# Patient Record
Sex: Male | Born: 1995 | Race: Black or African American | Hispanic: No | Marital: Single | Smoking: Never smoker
Health system: Southern US, Community
[De-identification: ages and names within clinical notes are randomized; demographics above are authoritative.]

## PROBLEM LIST (undated history)

## (undated) DIAGNOSIS — J302 Other seasonal allergic rhinitis: Secondary | ICD-10-CM

## (undated) DIAGNOSIS — F319 Bipolar disorder, unspecified: Secondary | ICD-10-CM

## (undated) HISTORY — PX: NO PAST SURGERIES: SHX2092

---
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
## 2016-08-14 ENCOUNTER — Emergency Department: Payer: 59

## 2016-08-14 ENCOUNTER — Encounter: Payer: Self-pay | Admitting: Emergency Medicine

## 2016-08-14 ENCOUNTER — Emergency Department (EMERGENCY_DEPARTMENT_HOSPITAL)
Admission: EM | Admit: 2016-08-14 | Discharge: 2016-08-15 | Disposition: A | Discharge status: Other Acute Inpatient Hospital | Payer: 59 | Source: Home / Self Care | Attending: Student | Admitting: Student

## 2016-08-14 DIAGNOSIS — R4689 Other symptoms and signs involving appearance and behavior: Secondary | ICD-10-CM

## 2016-08-14 DIAGNOSIS — F17200 Nicotine dependence, unspecified, uncomplicated: Secondary | ICD-10-CM

## 2016-08-14 DIAGNOSIS — F23 Brief psychotic disorder: Secondary | ICD-10-CM | POA: Insufficient documentation

## 2016-08-14 DIAGNOSIS — F2081 Schizophreniform disorder: Secondary | ICD-10-CM

## 2016-08-14 DIAGNOSIS — F312 Bipolar disorder, current episode manic severe with psychotic features: Secondary | ICD-10-CM | POA: Diagnosis not present

## 2016-08-14 LAB — COMPREHENSIVE METABOLIC PANEL
ALBUMIN: 5.2 g/dL — AB (ref 3.5–5.0)
ALT: 12 U/L — ABNORMAL LOW (ref 17–63)
ANION GAP: 8 (ref 5–15)
AST: 19 U/L (ref 15–41)
Alkaline Phosphatase: 99 U/L (ref 38–126)
BUN: 16 mg/dL (ref 6–20)
CO2: 24 mmol/L (ref 22–32)
Calcium: 9.6 mg/dL (ref 8.9–10.3)
Chloride: 107 mmol/L (ref 101–111)
Creatinine, Ser: 1.14 mg/dL (ref 0.61–1.24)
Glucose, Bld: 130 mg/dL — ABNORMAL HIGH (ref 65–99)
POTASSIUM: 3.4 mmol/L — AB (ref 3.5–5.1)
Sodium: 139 mmol/L (ref 135–145)
TOTAL PROTEIN: 8.2 g/dL — AB (ref 6.5–8.1)
Total Bilirubin: 1.1 mg/dL (ref 0.3–1.2)

## 2016-08-14 LAB — SALICYLATE LEVEL

## 2016-08-14 LAB — CBC
HCT: 44.6 % (ref 40.0–52.0)
HEMOGLOBIN: 15.7 g/dL (ref 13.0–18.0)
MCH: 30.8 pg (ref 26.0–34.0)
MCHC: 35.1 g/dL (ref 32.0–36.0)
MCV: 87.7 fL (ref 80.0–100.0)
PLATELETS: 210 10*3/uL (ref 150–440)
RBC: 5.09 MIL/uL (ref 4.40–5.90)
RDW: 13 % (ref 11.5–14.5)
WBC: 5.2 10*3/uL (ref 3.8–10.6)

## 2016-08-14 LAB — ACETAMINOPHEN LEVEL

## 2016-08-14 LAB — ETHANOL

## 2016-08-14 MED ORDER — LORAZEPAM 2 MG/ML IJ SOLN: 2.0000 mg | INTRAMUSCULAR | Status: DC | PRN

## 2016-08-14 MED ORDER — RISPERIDONE 1 MG PO TBDP
1.0000 mg | ORAL_TABLET | Freq: Two times a day (BID) | ORAL | Status: DC
  Administered 2016-08-15 (×2): 1 mg via ORAL
  Filled 2016-08-14 (×3): qty 1

## 2016-08-14 MED ORDER — LORAZEPAM 2 MG/ML IJ SOLN
1.0000 mg | Freq: Once | INTRAMUSCULAR | Status: AC
  Administered 2016-08-14: 1 mg via INTRAMUSCULAR
  Filled 2016-08-14: qty 1

## 2016-08-14 MED ORDER — DIPHENHYDRAMINE HCL 50 MG/ML IJ SOLN
50.0000 mg | Freq: Once | INTRAMUSCULAR | Status: AC
  Administered 2016-08-14: 50 mg via INTRAMUSCULAR
  Filled 2016-08-14: qty 1

## 2016-08-14 MED ORDER — LORAZEPAM 2 MG PO TABS: 2.0000 mg | ORAL_TABLET | ORAL | Status: DC | PRN

## 2016-08-14 MED ORDER — ZIPRASIDONE MESYLATE 20 MG IM SOLR
20.0000 mg | Freq: Once | INTRAMUSCULAR | Status: AC
  Administered 2016-08-14: 20 mg via INTRAMUSCULAR
  Filled 2016-08-14: qty 20

## 2016-08-14 NOTE — BH Assessment (Signed)
TTS was unable to complete consult due to patient giving IM medications and was resting.

## 2016-08-14 NOTE — ED Notes (Signed)
PT  IVC  PENDING  PLACEMENT SEEN  BY  DR  [employer]

## 2016-08-14 NOTE — ED Triage Notes (Addendum)
Pt to ed with police who report pt was picked up after trying to bang on neighbors door.  Pt alert and oriented at triage.  Pt refusing to answer questions at times.  Staring off into space and not following commands.  Pt in handcuffs with police present.  Pt family reports he has been paranoid lately, pt reports he is different than others.  Per pt mother pt has been up for the past 2 days without sleep and yelling loudly when the lights are out,however states pt has been "saying things that don't make sense for several weeks" pt fearful at times according to family.  Per family pt has NO history of diagnosed mental illness.

## 2016-08-14 NOTE — Consult Note (Signed)
[facility] Psychiatry Consult   Reason for Consult:  Consult 20 year old man with what appears to be new onset psychosis brought into the emergency room under ivc filed by family Referring Physician:  lord Patient Identification: Leonard Davidson MRN:  [id number] Principal Diagnosis: Schizophreniform disorder [facility]) Diagnosis:   Patient Active Problem List   Diagnosis Date Noted  . Schizophreniform disorder ([place]) [F20.81] 08/14/2016    Total Time spent with patient: 1 hour  Subjective:   Leonard Davidson is a 20 y.o. male patient admitted with "I'm good, bro".  HPI:  Patient interviewed. Chart review. 20 year old man brought to the hospital under IVC filed by his family. I spoke with the patient although he was not forthcoming with much useful information. Labs have been reviewed so far. Spoke with his mother on the telephone. Family reports that he has been showing progressively strange or behavior 4 months. They described that recently he hardly ever leaves the house, doesn't communicate with other people, talks about how he hates everyone. He rarely sleeps and stays up all night. Not eating normally. He has been asking bizarre questions of his mother. Refusing to sleep at night without having every light on in the house. On interview today the patient wouldn't answer direct questions about any of his symptoms. He answers most questions with other questions of his own or with odd comments. He makes gestures to suggest that he's been smoking recently but won't answer specific questions about it. Won't answer specific questions about hallucinations. Make some odd statements at times. Reportedly he had been making other bizarre and paranoid sounding statements when he first came into the emergency room. Mother reports that the patient never really has developed normally since graduating high school. He only works about a day a week and mostly stays home watching video games. She does not  know whether or what his substance abuse pattern might be. Evidently family has tried to get him to see a mental health professional but he has refused up until now.  Social history: Lives with his mother and brother. Mother reports that the patient works about one day a week. Father not involved in his life.  Medical history: No known ongoing medical problems.  Substance abuse history: Drug screen is not back yet. Alcohol level is negative. Patient makes a lot of gestures indicating the act of smoking but won't answer any specific questions about it. Past Psychiatric History: No previous psychiatric treatment or evaluation. No previous hospitalization. No history of suicide attempts or violence.  Risk to Self: Is patient at risk for suicide?: No, but patient needs Medical Clearance Risk to Others:   Prior Inpatient Therapy:   Prior Outpatient Therapy:    Past Medical History: History reviewed. No pertinent past medical history. History reviewed. No pertinent surgical history. Family History: History reviewed. No pertinent family history. Family Psychiatric  History: Mother is not aware of any family history of mental illness Social History:  History  Alcohol Use No     History  Drug Use  . Types: Marijuana    Social History   Social History  . Marital status: Single    Spouse name: N/A  . Number of children: N/A  . Years of education: N/A   Social History Main Topics  . Smoking status: Current Every Day Smoker  . Smokeless tobacco: Never Used  . Alcohol use No  . Drug use:     Types: Marijuana  . Sexual activity: Not Asked   Other  Topics Concern  . None   Social History Narrative  . None   Additional Social History:    Allergies:  No Known Allergies  Labs:  Results for orders placed or performed during the hospital encounter of 08/14/16 (from the past 48 hour(s))  Comprehensive metabolic panel     Status: Abnormal   Collection Time: 08/14/16  1:58 PM  Result  Value Ref Range   Sodium 139 135 - 145 mmol/L   Potassium 3.4 (L) 3.5 - 5.1 mmol/L   Chloride 107 101 - 111 mmol/L   CO2 24 22 - 32 mmol/L   Glucose, Bld 130 (H) 65 - 99 mg/dL   BUN 16 6 - 20 mg/dL   Creatinine, Ser 1.14 0.61 - 1.24 mg/dL   Calcium 9.6 8.9 - 10.3 mg/dL   Total Protein 8.2 (H) 6.5 - 8.1 g/dL   Albumin 5.2 (H) 3.5 - 5.0 g/dL   AST 19 15 - 41 U/L   ALT 12 (L) 17 - 63 U/L   Alkaline Phosphatase 99 38 - 126 U/L   Total Bilirubin 1.1 0.3 - 1.2 mg/dL   GFR calc non Af Amer >60 >60 mL/min   GFR calc Af Amer >60 >60 mL/min    Comment: (NOTE) The eGFR has been calculated using the CKD EPI equation. This calculation has not been validated in all clinical situations. eGFR's persistently <60 mL/min signify possible Chronic Kidney Disease.    Anion gap 8 5 - 15  Ethanol     Status: None   Collection Time: 08/14/16  1:58 PM  Result Value Ref Range   Alcohol, Ethyl (B) <5 <5 mg/dL    Comment:        LOWEST DETECTABLE LIMIT FOR SERUM ALCOHOL IS 5 mg/dL FOR MEDICAL PURPOSES ONLY   cbc     Status: None   Collection Time: 08/14/16  1:58 PM  Result Value Ref Range   WBC 5.2 3.8 - 10.6 K/uL   RBC 5.09 4.40 - 5.90 MIL/uL   Hemoglobin 15.7 13.0 - 18.0 g/dL   HCT 44.6 40.0 - 52.0 %   MCV 87.7 80.0 - 100.0 fL   MCH 30.8 26.0 - 34.0 pg   MCHC 35.1 32.0 - 36.0 g/dL   RDW 13.0 11.5 - 14.5 %   Platelets 210 150 - 440 K/uL    Current Facility-Administered Medications  Medication Dose Route Frequency Provider Last Rate Last Dose  . LORazepam (ATIVAN) injection 1 mg  1 mg Intramuscular Once Joanne Gavel, MD      . LORazepam (ATIVAN) tablet 2 mg  2 mg Oral Q4H PRN Gonzella Lex, MD       Or  . LORazepam (ATIVAN) injection 2 mg  2 mg Intramuscular Q4H PRN Gonzella Lex, MD      . risperiDONE (RISPERDAL M-TABS) disintegrating tablet 1 mg  1 mg Oral BID Gonzella Lex, MD      . ziprasidone (GEODON) injection 20 mg  20 mg Intramuscular Once Joanne Gavel, MD       No current  outpatient prescriptions on file.    Musculoskeletal: Strength & Muscle Tone: within normal limits Gait & Station: normal Patient leans: N/A  Psychiatric Specialty Exam: Physical Exam  Nursing note and vitals reviewed. Constitutional: He appears well-developed and well-nourished.  HENT:  Head: Normocephalic and atraumatic.  Eyes: Conjunctivae are normal. Pupils are equal, round, and reactive to light.  Neck: Normal range of motion.  Cardiovascular: Regular rhythm and  normal heart sounds.   Respiratory: Effort normal.  GI: Soft.  Musculoskeletal: Normal range of motion.  Neurological: He is alert.  Skin: Skin is warm and dry.  Psychiatric: His affect is blunt and inappropriate. His speech is delayed and tangential. He is withdrawn. Thought content is paranoid. He expresses inappropriate judgment. He expresses no homicidal and no suicidal ideation. He is noncommunicative.    Review of Systems  Unable to perform ROS: Psychiatric disorder    Blood pressure (!) 144/77, pulse 89, temperature 98 F (36.7 C), temperature source Oral, resp. rate 20, height _0  (1.905 m), weight 81.6 kg (180 lb), SpO2 100 %.Body mass index is 22.5 kg/m.  General Appearance: Casual  Eye Contact:  Minimal  Speech:  Blocked and Garbled  Volume:  Decreased  Mood:  Euthymic  Affect:  Inappropriate  Thought Process:  Disorganized  Orientation:  Negative  Thought Content:  Illogical, Paranoid Ideation and Tangential  Suicidal Thoughts:  No  Homicidal Thoughts:  No  Memory:  Immediate;   Negative Recent;   Negative Remote;   Negative  Judgement:  Impaired  Insight:  Lacking  Psychomotor Activity:  Decreased  Concentration:  Concentration: Poor  Recall:  Poor  Fund of Knowledge:  Fair  Language:  Fair  Akathisia:  No  Handed:  Right  AIMS (if indicated):     Assets:  Housing Physical Health Social Support  ADL's:  Intact  Cognition:  Impaired,  Mild  Sleep:        Treatment Plan  Summary: Daily contact with patient to assess and evaluate symptoms and progress in treatment, Medication management and Plan This is a 20 year old man who has a recent history highly consistent with new onset psychosis. Differential diagnosis includes schizophreniform disorder, drug-induced psychosis, potentially rare medically induced psychosis or psychotic mood disorder as most likely diagnoses. His behavior right now is very typical of early schizophreniform disorder. Patient is only partially cooperative right now with treatment. I think he clearly meets commitment criteria and requires inpatient treatment. I'll inform the mother that we will be getting labs and evaluation while he is here and trying to get him to start taking medication. Depending on his degree of cooperation we will either get him downstairs to the inpatient ward quickly or once he calms down a little bit. Orders are done for when necessary Ativan as well for starting antipsychotic medication. Case reviewed with emergency room doctor commitment papers taken care of.  Disposition: Recommend psychiatric Inpatient admission when medically cleared. Supportive therapy provided about ongoing stressors.  Alethia Berthold, MD 08/14/2016 3:17 PM

## 2016-08-14 NOTE — ED Notes (Addendum)
Pt in via triage, refuses to answer any of my questions.  When asked about today, he states, "you already know."  Pt with repetitive speech, becomes verbally aggressive stating, "you are about to make me mad, you don't want to see me mad."  Pt denies any SI/HI.  Pt A/Ox4, vitals WDL.  MD and charge RN notified of potentially aggressive behavior.  ODS present.

## 2016-08-14 NOTE — ED Provider Notes (Signed)
[facility] Emergency Department Provider Note   ____________________________________________   Approximate time seen 3 PM  I have reviewed the triage vital signs and the nursing notes.   HISTORY  Chief Complaint Psychiatric Evaluation  Caveat-history of present illness review of systems is limited due to the patient's severe psychosis and agitation. Information is obtained from his family.  HPI SHIV Leonard Davidson is a 20 y.o. male no chronic medical palms and presents under involuntary commitment by his mother due to bizarre behavior ongoing for several months. Apparently, he has been behaving bizarrely, very paranoid, will not sleep unless every light is turned on in the house. Has poor appetite and decreased sleep. Has been abusing drugs. He has seemed agitated and aggressive at home. His family has been trying to get him assistance with the mental health provider but he has refused. Patient will not answer any of my questions.   History reviewed. No pertinent past medical history.  Patient Active Problem List   Diagnosis Date Noted  . Schizophreniform disorder ([facility]) 08/14/2016    History reviewed. No pertinent surgical history.  Prior to Admission medications   Not on File    Allergies Review of patient's allergies indicates no known allergies.  History reviewed. No pertinent family history.  Social History Social History  Substance Use Topics  . Smoking status: Current Every Day Smoker  . Smokeless tobacco: Never Used  . Alcohol use No    Review of Systems  Caveat-history of present illness review of systems is limited due to the patient's severe psychosis and agitation. Information is obtained from his family. ____________________________________________   PHYSICAL EXAM:  Vitals:   08/14/16 1354 08/14/16 1448 08/14/16 2003 08/14/16 2200  BP:  (!) 144/77  114/77  Pulse:  89 (!) 57 (!) 52  Resp:  20  18  Temp:  98 F (36.7 C)   97.8 F (36.6 C)  TempSrc:  Oral  Oral  SpO2:  100% 97% 99%  Weight: 180 lb (81.6 kg)     Height: 6\' 3"  (1.905 m)       VITAL SIGNS: ED Triage Vitals  Enc Vitals Group     BP 08/14/16 1448 (!) 144/77     Pulse Rate 08/14/16 1448 89     Resp 08/14/16 1448 20     Temp 08/14/16 1448 98 F (36.7 C)     Temp Source 08/14/16 1448 Oral     SpO2 08/14/16 1448 100 %     Weight 08/14/16 1354 180 lb (81.6 kg)     Height 08/14/16 1354 6\' 3"  (1.905 m)     Head Circumference --      Peak Flow --      Pain Score 08/14/16 1354 0     Pain Loc --      Pain Edu? --      Excl. in GC? --     Constitutional: Awake and alert, agitated, refusing to answer questions deescalates briefly but then becomes agitated again. Eyes: Conjunctivae are normal. Pupils are round and reactive, symmetric bilaterally. Trochlear movements intact. Head: Atraumatic. Nose: No congestion/rhinnorhea. Mouth/Throat: Mucous membranes appear moist.   Neck: No stridor.  Cardiovascular: Normal rate, regular rhythm. Grossly normal heart sounds.  Good peripheral circulation. Respiratory: Normal respiratory effort.  No retractions. Lungs CTAB. Gastrointestinal: Soft and nontender. No distention. Genitourinary: deferred Musculoskeletal: No lower extremity tenderness nor edema.  No joint effusions. Neurologic:  Normal speech and language. No gross focal neurologic deficits are  appreciated. No gait instability. Skin:  Skin is warm, dry and intact. No rash noted. Psychiatric: Mood is agitated and labile. He is confrontational.  ____________________________________________   LABS (all labs ordered are listed, but only abnormal results are displayed)  Labs Reviewed  COMPREHENSIVE METABOLIC PANEL - Abnormal; Notable for the following:       Result Value   Potassium 3.4 (*)    Glucose, Bld 130 (*)    Total Protein 8.2 (*)    Albumin 5.2 (*)    ALT 12 (*)    All other components within normal limits  ACETAMINOPHEN LEVEL -  Abnormal; Notable for the following:    Acetaminophen (Tylenol), Serum <10 (*)    All other components within normal limits  ETHANOL  CBC  SALICYLATE LEVEL  URINE DRUG SCREEN, QUALITATIVE ([facility] ONLY)  URINALYSIS COMPLETEWITH MICROSCOPIC ([facility] ONLY)   ____________________________________________  EKG  none ____________________________________________  RADIOLOGY  CT head IMPRESSION:  No acute abnormality noted.       ____________________________________________   PROCEDURES  Procedure(s) performed: None  Procedures  Critical Care performed: No  ____________________________________________   INITIAL IMPRESSION / ASSESSMENT AND PLAN / ED COURSE  Pertinent labs & imaging results that were available during my care of the patient were reviewed by me and considered in my medical decision making (see chart for details).  Christiana FuchsWilliam J Sirmons is a 20 y.o. male no chronic medical palms and presents under involuntary commitment by his mother due to bizarre behavior ongoing for several months. On initial exam, he was agitated but somewhat directable however that his change. He is escalating, balling up his fist threatening nurses, is a threat of harm to self and others. I have ordered 20 mg of IM Geodon as well as IM Ativan. Continue involuntary commitment. Psychiatry has been consulted and recommends inpatient admission. Screening psychiatric labs ordered. I also ordered a CT scan of his head given no prior history of psychosis which is likely organically psychiatric however this will have to wait given his acute agitation.  ----------------------------------------- 7:10 PM on 08/14/2016 ----------------------------------------- Patient appears more calm, no longer agitated but still refusing all treatment. In fact, he was transferred to CT and refused to lie down. I will order additional Ativan and Benadryl to help with his agitation that we can obtain a necessary test which he  does not have capacity to refuse secondary to acute psychosis.  ----------------------------------------- 11:15 PM on 08/14/2016 ----------------------------------------- Patient tolerated CT well without any issues. CT head is negative. CBC and CMP are generally unremarkable, undetectable acetaminophen, was late and ethanol levels. Still awaiting urine drug screen and urinalysis. Psychiatry recommends inpatient admission for mood stabilization. Care transferred to Dr. Don PerkingVeronese at this time.  Clinical Course     ____________________________________________   FINAL CLINICAL IMPRESSION(S) / ED DIAGNOSES  Final diagnoses:  Aggressive behavior  Acute psychosis      NEW MEDICATIONS STARTED DURING THIS VISIT:  New Prescriptions   No medications on file     Note:  This document was prepared using Dragon voice recognition software and may include unintentional dictation errors.    Gayla DossEryka A Anysa Tacey, MD 08/14/16 [phone]

## 2016-08-14 NOTE — BH Assessment (Signed)
Assessment Note  Leonard FuchsWilliam J Davidson is an 20 y.o. male presenting to the ED with police after being picked up for to bang on neighbors door. assessment of patient is limited due to sedation and patient being uncooperative at times.    Patient history obtain from review of record and collateral information from patient's family.  Pt family reports he has been paranoid lately.  His mother reports patient has been up for the past 2 days without sleep and yelling loudly when the lights are out,however states pt has been "saying things that don't make sense for several weeks" pt fearful at times according to family. Patient's family states that he has no prior history of mental illness or treatment.  Diagnosis: Schizophreniform disordwer  Past Medical History: History reviewed. No pertinent past medical history.  History reviewed. No pertinent surgical history.  Family History: History reviewed. No pertinent family history.  Social History:  reports that he has been smoking.  He has never used smokeless tobacco. He reports that he uses drugs, including Marijuana. He reports that he does not drink alcohol.  Additional Social History:  Alcohol / Drug Use History of alcohol / drug use?: No history of alcohol / drug abuse  CIWA: CIWA-Ar BP: (!) 144/77 Pulse Rate: (!) 57 COWS:    Allergies: No Known Allergies  Home Medications:  (Not in a hospital admission)  OB/GYN Status:  No LMP for male patient.  General Assessment Data [facility] Assessment: In system Is this a Tele or Face-to-Face Assessment?: Face-to-Face Is this an Initial Assessment or a Re-assessment for this encounter?: Initial Assessment Marital status: Single Maiden name: n/a Is patient pregnant?: No Pregnancy Status: No Living Arrangements: Parent Can pt return to current living arrangement?: Yes Admission Status: Involuntary Is patient capable of signing voluntary admission?: No Referral  Source: Self/Family/Friend Insurance type: UHC  Medical Screening Exam [facility] Walk-in ONLY) Medical Exam completed: Yes  Crisis Care Plan Living Arrangements: Parent Legal Guardian: Other: (self) Name of Psychiatrist: n/a Name of Therapist: n/a  Education Status Is patient currently in school?: No Current Grade: n/a Highest grade of school patient has completed: 12 Name of school: n/a Contact person: n/a  Risk to self with the past 6 months Suicidal Ideation: No Has patient been a risk to self within the past 6 months prior to admission? : No Suicidal Intent: No Has patient had any suicidal intent within the past 6 months prior to admission? : No Is patient at risk for suicide?: No Suicidal Plan?: No Has patient had any suicidal plan within the past 6 months prior to admission? : No Access to Means: No What has been your use of drugs/alcohol within the last 12 months?: unknown Previous Attempts/Gestures: No Other Self Harm Risks: none known Triggers for Past Attempts: None known Intentional Self Injurious Behavior: None Family Suicide History: Unknown Recent stressful life event(s): Other (Comment) Persecutory voices/beliefs?: No Depression: No Substance abuse history and/or treatment for substance abuse?: No Suicide prevention information given to non-admitted patients: Not applicable  Risk to Others within the past 6 months Homicidal Ideation: No Does patient have any lifetime risk of violence toward others beyond the six months prior to admission? : No Thoughts of Harm to Others: No Current Homicidal Intent: No Current Homicidal Plan: No Access to Homicidal Means: No Identified Victim: none identified History of harm to others?: No Assessment of Violence: None Noted Violent Behavior Description: none identified Does patient have access to weapons?: No Criminal  Charges Pending?: No Does patient have a court date: No Is patient on probation?:  No  Psychosis Hallucinations: None noted Delusions: None noted  Mental Status Report Appearance/Hygiene: In scrubs Eye Contact: Unable to Assess Motor Activity: Unable to assess Speech: Unable to assess Level of Consciousness: Sedated Mood: Other (Comment) Affect: Unable to Assess Anxiety Level: None Judgement: Unable to Assess Orientation: Unable to assess Obsessive Compulsive Thoughts/Behaviors: Unable to Assess  Cognitive Functioning Concentration: Unable to Assess Memory: Unable to Assess IQ: Average Insight: Unable to Assess Impulse Control: Unable to Assess Sleep: Unable to Assess Vegetative Symptoms: None  ADLScreening [facility] Assessment Services) Patient's cognitive ability adequate to safely complete daily activities?: Yes Patient able to express need for assistance with ADLs?: Yes Independently performs ADLs?: Yes (appropriate for developmental age)  Prior Inpatient Therapy Prior Inpatient Therapy: No Prior Therapy Dates: n/a Prior Therapy Facilty/Provider(s): n/a Reason for Treatment: n/a  Prior Outpatient Therapy Prior Outpatient Therapy: No Prior Therapy Dates: n/a Prior Therapy Facilty/Provider(s): n/a Reason for Treatment: n/a Does patient have an ACCT team?: No Does patient have Intensive In-House Services?  : No Does patient have [facility] services? : No Does patient have P4CC services?: No  ADL Screening (condition at time of admission) Patient's cognitive ability adequate to safely complete daily activities?: Yes Patient able to express need for assistance with ADLs?: Yes Independently performs ADLs?: Yes (appropriate for developmental age)       Abuse/Neglect Assessment (Assessment to be complete while patient is alone) Physical Abuse: Denies Verbal Abuse: Denies Sexual Abuse: Denies Exploitation of patient/patient's resources: Denies Self-Neglect: Denies Values / Beliefs Cultural Requests During Hospitalization: None Spiritual Requests  During Hospitalization: None Consults Spiritual Care Consult Needed: No Social Work Consult Needed: No      Additional Information 1:1 In Past 12 Months?: No CIRT Risk: No Elopement Risk: No Does patient have medical clearance?: No     Disposition:  Disposition Initial Assessment Completed for this Encounter: Yes Disposition of Patient: Inpatient treatment program Type of inpatient treatment program: Adult  On Site Evaluation by:   Reviewed with Physician:    [place] 08/14/2016 8:47 PM

## 2016-08-14 NOTE — ED Notes (Signed)
Dr. Toni Amendlapacs to bedside

## 2016-08-14 NOTE — ED Notes (Signed)
Pt taken to CT, transported with ED tech and ODS.  Once in CT, pt states he does not need scan and refuses to be strapped down.  MD notified; plan is to  re-medicate pt so that scan can be performed.

## 2016-08-15 ENCOUNTER — Inpatient Hospital Stay
Admission: RE | Admit: 2016-08-15 | Discharge: 2016-08-20 | DRG: 885 | Disposition: A | Discharge status: Home / Self Care | Payer: 59 | Source: Intra-hospital | Attending: Psychiatry | Admitting: Psychiatry

## 2016-08-15 DIAGNOSIS — G4700 Insomnia, unspecified: Secondary | ICD-10-CM | POA: Diagnosis present

## 2016-08-15 DIAGNOSIS — F1220 Cannabis dependence, uncomplicated: Secondary | ICD-10-CM

## 2016-08-15 DIAGNOSIS — F2081 Schizophreniform disorder: Secondary | ICD-10-CM | POA: Diagnosis not present

## 2016-08-15 DIAGNOSIS — F312 Bipolar disorder, current episode manic severe with psychotic features: Principal | ICD-10-CM

## 2016-08-15 DIAGNOSIS — R4587 Impulsiveness: Secondary | ICD-10-CM | POA: Diagnosis present

## 2016-08-15 DIAGNOSIS — R451 Restlessness and agitation: Secondary | ICD-10-CM | POA: Diagnosis present

## 2016-08-15 LAB — URINALYSIS COMPLETE WITH MICROSCOPIC (ARMC ONLY)
Bacteria, UA: NONE SEEN
Bilirubin Urine: NEGATIVE
GLUCOSE, UA: NEGATIVE mg/dL
Hgb urine dipstick: NEGATIVE
KETONES UR: NEGATIVE mg/dL
Leukocytes, UA: NEGATIVE
Nitrite: NEGATIVE
PROTEIN: 30 mg/dL — AB
Specific Gravity, Urine: 1.03 (ref 1.005–1.030)
pH: 5 (ref 5.0–8.0)

## 2016-08-15 MED ORDER — ACETAMINOPHEN 325 MG PO TABS: 650.0000 mg | ORAL_TABLET | Freq: Four times a day (QID) | ORAL | Status: DC | PRN

## 2016-08-15 MED ORDER — MAGNESIUM HYDROXIDE 400 MG/5ML PO SUSP: 30.0000 mL | Freq: Every day | ORAL | Status: DC | PRN

## 2016-08-15 MED ORDER — ALUM & MAG HYDROXIDE-SIMETH 200-200-20 MG/5ML PO SUSP: 30.0000 mL | ORAL | Status: DC | PRN

## 2016-08-15 MED ORDER — RISPERIDONE 1 MG PO TBDP
1.0000 mg | ORAL_TABLET | Freq: Two times a day (BID) | ORAL | Status: DC
  Administered 2016-08-16: 1 mg via ORAL
  Filled 2016-08-15: qty 1

## 2016-08-15 MED ORDER — LORAZEPAM 2 MG PO TABS
2.0000 mg | ORAL_TABLET | ORAL | Status: DC | PRN
  Administered 2016-08-15: 2 mg via ORAL
  Filled 2016-08-15: qty 1

## 2016-08-15 MED ORDER — LORAZEPAM 2 MG/ML IJ SOLN: 2.0000 mg | INTRAMUSCULAR | Status: DC | PRN

## 2016-08-15 NOTE — Consult Note (Signed)
[facility] Consult   Reason for Consult:  Consult 20 year old man with what appears to be new onset psychosis brought into the emergency room under ivc filed by family Referring Physician:  lord Patient Identification: Leonard Davidson MRN:  [id number] Principal Diagnosis: Schizophreniform disorder [facility]) Diagnosis:   Patient Active Problem List   Diagnosis Date Noted  . Schizophreniform disorder ([place]) [F20.81] 08/14/2016    Total Time spent with patient: 20 minutes  Subjective:   Leonard Davidson is a 20 y.o. male patient admitted with "I'm good, bro".  Follow-up for this 20 year old man with new onset psychosis in the emergency room. Patient seen again today. Patient not reporting any symptoms. Still argumentative about being in the hospital and dismissive of any concerns. He has been taking medication. He is slightly more appropriate in his interactions today. Not aggressive or threatening. Still does not seem to be thinking clearly.  HPI:  Patient interviewed. Chart review. 20 year old man brought to the hospital under IVC filed by his family. I spoke with the patient although he was not forthcoming with much useful information. Labs have been reviewed so far. Spoke with his mother on the telephone. Family reports that he has been showing progressively strange or behavior 4 months. They described that recently he hardly ever leaves the house, doesn't communicate with other people, talks about how he hates everyone. He rarely sleeps and stays up all night. Not eating normally. He has been asking bizarre questions of his mother. Refusing to sleep at night without having every light on in the house. On interview today the patient wouldn't answer direct questions about any of his symptoms. He answers most questions with other questions of his own or with odd comments. He makes gestures to suggest that he's been smoking recently but won't answer specific questions about it.  Won't answer specific questions about hallucinations. Make some odd statements at times. Reportedly he had been making other bizarre and paranoid sounding statements when he first came into the emergency room. Mother reports that the patient never really has developed normally since graduating high school. He only works about a day a week and mostly stays home watching video games. She does not know whether or what his substance abuse pattern might be. Evidently family has tried to get him to see a mental health professional but he has refused up until now.  Social history: Lives with his mother and brother. Mother reports that the patient works about one day a week. Father not involved in his life.  Medical history: No known ongoing medical problems.  Substance abuse history: Drug screen is not back yet. Alcohol level is negative. Patient makes a lot of gestures indicating the act of smoking but won't answer any specific questions about it. Past Psychiatric History: No previous psychiatric treatment or evaluation. No previous hospitalization. No history of suicide attempts or violence.  Risk to Self: Suicidal Ideation: No Suicidal Intent: No Is patient at risk for suicide?: No Suicidal Plan?: No Access to Means: No What has been your use of drugs/alcohol within the last 12 months?: unknown Other Self Harm Risks: none known Triggers for Past Attempts: None known Intentional Self Injurious Behavior: None Risk to Others: Homicidal Ideation: No Thoughts of Harm to Others: No Current Homicidal Intent: No Current Homicidal Plan: No Access to Homicidal Means: No Identified Victim: none identified History of harm to others?: No Assessment of Violence: None Noted Violent Behavior Description: none identified Does patient have access to weapons?: No  Criminal Charges Pending?: No Does patient have a court date: No Prior Inpatient Therapy: Prior Inpatient Therapy: No Prior Therapy Dates:  n/a Prior Therapy Facilty/Provider(s): n/a Reason for Treatment: n/a Prior Outpatient Therapy: Prior Outpatient Therapy: No Prior Therapy Dates: n/a Prior Therapy Facilty/Provider(s): n/a Reason for Treatment: n/a Does patient have an ACCT team?: No Does patient have Intensive In-House Services?  : No Does patient have [facility] services? : No Does patient have P4CC services?: No  Past Medical History: History reviewed. No pertinent past medical history. History reviewed. No pertinent surgical history. Family History: History reviewed. No pertinent family history. Family Psychiatric  History: Mother is not aware of any family history of mental illness Social History:  History  Alcohol Use No     History  Drug Use  . Types: Marijuana    Social History   Social History  . Marital status: Single    Spouse name: N/A  . Number of children: N/A  . Years of education: N/A   Social History Main Topics  . Smoking status: Current Every Day Smoker  . Smokeless tobacco: Never Used  . Alcohol use No  . Drug use:     Types: Marijuana  . Sexual activity: Not Asked   Other Topics Concern  . None   Social History Narrative  . None   Additional Social History:    Allergies:  No Known Allergies  Labs:  Results for orders placed or performed during the hospital encounter of 08/14/16 (from the past 48 hour(s))  Comprehensive metabolic panel     Status: Abnormal   Collection Time: 08/14/16  1:58 PM  Result Value Ref Range   Sodium 139 135 - 145 mmol/L   Potassium 3.4 (L) 3.5 - 5.1 mmol/L   Chloride 107 101 - 111 mmol/L   CO2 24 22 - 32 mmol/L   Glucose, Bld 130 (H) 65 - 99 mg/dL   BUN 16 6 - 20 mg/dL   Creatinine, Ser 1.14 0.61 - 1.24 mg/dL   Calcium 9.6 8.9 - 10.3 mg/dL   Total Protein 8.2 (H) 6.5 - 8.1 g/dL   Albumin 5.2 (H) 3.5 - 5.0 g/dL   AST 19 15 - 41 U/L   ALT 12 (L) 17 - 63 U/L   Alkaline Phosphatase 99 38 - 126 U/L   Total Bilirubin 1.1 0.3 - 1.2 mg/dL   GFR  calc non Af Amer >60 >60 mL/min   GFR calc Af Amer >60 >60 mL/min    Comment: (NOTE) The eGFR has been calculated using the CKD EPI equation. This calculation has not been validated in all clinical situations. eGFR's persistently <60 mL/min signify possible Chronic Kidney Disease.    Anion gap 8 5 - 15  Ethanol     Status: None   Collection Time: 08/14/16  1:58 PM  Result Value Ref Range   Alcohol, Ethyl (B) <5 <5 mg/dL    Comment:        LOWEST DETECTABLE LIMIT FOR SERUM ALCOHOL IS 5 mg/dL FOR MEDICAL PURPOSES ONLY   cbc     Status: None   Collection Time: 08/14/16  1:58 PM  Result Value Ref Range   WBC 5.2 3.8 - 10.6 K/uL   RBC 5.09 4.40 - 5.90 MIL/uL   Hemoglobin 15.7 13.0 - 18.0 g/dL   HCT 44.6 40.0 - 52.0 %   MCV 87.7 80.0 - 100.0 fL   MCH 30.8 26.0 - 34.0 pg   MCHC 35.1 32.0 - 36.0 g/dL  RDW 13.0 11.5 - 14.5 %   Platelets 210 150 - 440 K/uL  Acetaminophen level     Status: Abnormal   Collection Time: 08/14/16  1:58 PM  Result Value Ref Range   Acetaminophen (Tylenol), Serum <10 (L) 10 - 30 ug/mL    Comment:        THERAPEUTIC CONCENTRATIONS VARY SIGNIFICANTLY. A RANGE OF 10-30 ug/mL MAY BE AN EFFECTIVE CONCENTRATION FOR MANY PATIENTS. HOWEVER, SOME ARE BEST TREATED AT CONCENTRATIONS OUTSIDE THIS RANGE. ACETAMINOPHEN CONCENTRATIONS >150 ug/mL AT 4 HOURS AFTER INGESTION AND >50 ug/mL AT 12 HOURS AFTER INGESTION ARE OFTEN ASSOCIATED WITH TOXIC REACTIONS.   Salicylate level     Status: None   Collection Time: 08/14/16  1:58 PM  Result Value Ref Range   Salicylate Lvl <5.1 2.8 - 30.0 mg/dL  Urinalysis complete, with microscopic ([facility] only)     Status: Abnormal   Collection Time: 08/15/16  7:16 AM  Result Value Ref Range   Color, Urine YELLOW (A) YELLOW   APPearance CLEAR (A) CLEAR   Glucose, UA NEGATIVE NEGATIVE mg/dL   Bilirubin Urine NEGATIVE NEGATIVE   Ketones, ur NEGATIVE NEGATIVE mg/dL   Specific Gravity, Urine 1.030 1.005 - 1.030   Hgb urine  dipstick NEGATIVE NEGATIVE   pH 5.0 5.0 - 8.0   Protein, ur 30 (A) NEGATIVE mg/dL   Nitrite NEGATIVE NEGATIVE   Leukocytes, UA NEGATIVE NEGATIVE   RBC / HPF 0-5 0 - 5 RBC/hpf   WBC, UA 0-5 0 - 5 WBC/hpf   Bacteria, UA NONE SEEN NONE SEEN   Squamous Epithelial / LPF 0-5 (A) NONE SEEN   Mucous PRESENT     Current Facility-Administered Medications  Medication Dose Route Frequency Provider Last Rate Last Dose  . LORazepam (ATIVAN) tablet 2 mg  2 mg Oral Q4H PRN Gonzella Lex, MD       Or  . LORazepam (ATIVAN) injection 2 mg  2 mg Intramuscular Q4H PRN Gonzella Lex, MD      . risperiDONE (RISPERDAL M-TABS) disintegrating tablet 1 mg  1 mg Oral BID Gonzella Lex, MD   1 mg at 08/15/16 7001   No current outpatient prescriptions on file.    Musculoskeletal: Strength & Muscle Tone: within normal limits Gait & Station: normal Patient leans: N/A  Psychiatric Specialty Exam: Physical Exam  Nursing note and vitals reviewed. Constitutional: He appears well-developed and well-nourished.  HENT:  Head: Normocephalic and atraumatic.  Eyes: Conjunctivae are normal. Pupils are equal, round, and reactive to light.  Neck: Normal range of motion.  Cardiovascular: Regular rhythm and normal heart sounds.   Respiratory: Effort normal.  GI: Soft.  Musculoskeletal: Normal range of motion.  Neurological: He is alert.  Skin: Skin is warm and dry.  Psychiatric: His affect is blunt and inappropriate. His speech is delayed and tangential. He is withdrawn. Thought content is paranoid. He expresses inappropriate judgment. He expresses no homicidal and no suicidal ideation. He is noncommunicative.    Review of Systems  Unable to perform ROS: Psychiatric disorder    Blood pressure 108/80, pulse (!) 56, temperature 98.2 F (36.8 C), temperature source Oral, resp. rate 14, height _0  (1.905 m), weight 81.6 kg (180 lb), SpO2 100 %.Body mass index is 22.5 kg/m.  General Appearance: Casual  Eye  Contact:  Minimal  Speech:  Blocked and Garbled  Volume:  Decreased  Mood:  Euthymic  Affect:  Inappropriate  Thought Process:  Disorganized  Orientation:  Negative  Thought  Content:  Illogical, Paranoid Ideation and Tangential  Suicidal Thoughts:  No  Homicidal Thoughts:  No  Memory:  Immediate;   Negative Recent;   Negative Remote;   Negative  Judgement:  Impaired  Insight:  Lacking  Psychomotor Activity:  Decreased  Concentration:  Concentration: Poor  Recall:  Poor  Fund of Knowledge:  Fair  Language:  Fair  Akathisia:  No  Handed:  Right  AIMS (if indicated):     Assets:  Housing Physical Health Social Support  ADL's:  Intact  Cognition:  Impaired,  Mild  Sleep:        Treatment Plan Summary: Daily contact with patient to assess and evaluate symptoms and progress in treatment, Medication management and Plan 20 year old man with psychotic disorder currently in the emergency room. Orders are in place to admit him to the psychiatry ward downstairs. So far he has been compliant with risperidone. He made no threats or gestures that were threatening towards me and has not been aggressive with staff. He has not been disruptive in the emergency room. Remains thought disordered and appears paranoid. Continue to recommend upholding IVC in admission to the psychiatry ward. Situation reviewed with nursing and TTS. No other change to orders today.  Disposition: Recommend psychiatric Inpatient admission when medically cleared. Supportive therapy provided about ongoing stressors.  Alethia Berthold, MD 08/15/2016 4:22 PM

## 2016-08-15 NOTE — ED Notes (Signed)
Report called to Luke, RN

## 2016-08-15 NOTE — [facility] Group Notes (Signed)
[facility] Group Notes:  (Nursing/MHT/Case Management/Adjunct)  Date:  08/15/2016  Time:  11:56 PM  Type of Therapy:  Psychoeducational Skills  Participation Level:  Did Not Attend  Participation Quality:  Admit after group  Affect: Summary of Progress/Problems:  Leonard Davidson 08/15/2016, 11:56 PM

## 2016-08-15 NOTE — ED Notes (Signed)
Pt repeatedly asking this RN for ginger ale, this RN explained to patient that he could have water, pt stands in doorway repeating "Can I have some ginger ale?" This RN reiterated he could have water. Pt noted to be angry at this time. Spoke with MD regarding moving patient to [facility]. Per MD [facility].

## 2016-08-15 NOTE — ED Notes (Signed)
Pt standing at door talking to everyone walking by. UA collected.

## 2016-08-15 NOTE — ED Notes (Signed)
Pt called grandfather.  

## 2016-08-15 NOTE — ED Notes (Signed)
Pt stating to security that he can leave if he wants to. Security stated he would be stopped and pt snickered at officer.

## 2016-08-15 NOTE — BH Assessment (Signed)
[facility] by Dr. Toni Amendlapacs.  Attending Physician will be Dr. Ardyth HarpsHernandez.   Patient [facility] room 314[facility] Victorino DikeJennifer.   Intake Paper Work has been signed and placed on patient chart.  ER staff is aware of the admission Elder Love([place], ER Sect.; Dr. Carolyn Stareobison, ER MD; Aundra MilletMegan, Patient's Nurse & Nedra HaiLee, Patient Access).

## 2016-08-15 NOTE — ED Notes (Signed)
Lunch tray placed at bedside. Pt sleeping. 

## 2016-08-15 NOTE — ED Notes (Signed)
Pt D/C-[facility]. Escorted with ODS at this time.

## 2016-08-15 NOTE — ED Notes (Signed)
Pt refused shower. Pt is calling his mom on phone.

## 2016-08-15 NOTE — ED Notes (Signed)
Pt given snack tray per his request, pt then threw snack tray on the floor, this RN to bedside, pt states "get out i'm trying to watch TV! When am I going downstairs?" This RN informed patient that it was unknown when he was going downstairs at this time.

## 2016-08-15 NOTE — ED Notes (Signed)
Mother of pt calling, has pass code, mother than was asking specifics concerning findings on lab work, informed the mother that information would not be given over the phone, but did inform her of patients current behavior, and that we were waiting for further evaluation with Psyc MD

## 2016-08-15 NOTE — ED Notes (Signed)
Pt given ginger ale.

## 2016-08-15 NOTE — ED Notes (Signed)
Pt prefers to be called Leonard Davidson.

## 2016-08-15 NOTE — ED Notes (Signed)
Briefly: Pt brought in by police under IVC due to that pt banged on neighbor door. Mother reported that pt has been showing bizarre behavior for few months. Pt not eating, turning all light on in the house to go to sleep, not talking with people.. Mother suspected that pt might be abusing drug.

## 2016-08-15 NOTE — ED Notes (Signed)
Pt given breakfast tray

## 2016-08-15 NOTE — ED Notes (Signed)
Pt requires redirecion, with frequent occurrence, pt has had no aggressive behavior

## 2016-08-16 DIAGNOSIS — F312 Bipolar disorder, current episode manic severe with psychotic features: Principal | ICD-10-CM

## 2016-08-16 DIAGNOSIS — F1220 Cannabis dependence, uncomplicated: Secondary | ICD-10-CM

## 2016-08-16 LAB — URINE DRUG SCREEN, QUALITATIVE (ARMC ONLY)
AMPHETAMINES, UR SCREEN: NOT DETECTED
BARBITURATES, UR SCREEN: NOT DETECTED
BENZODIAZEPINE, UR SCRN: POSITIVE — AB
Cannabinoid 50 Ng, Ur ~~LOC~~: POSITIVE — AB
Cocaine Metabolite,Ur ~~LOC~~: NOT DETECTED
MDMA (Ecstasy)Ur Screen: NOT DETECTED
METHADONE SCREEN, URINE: NOT DETECTED
OPIATE, UR SCREEN: NOT DETECTED
PHENCYCLIDINE (PCP) UR S: NOT DETECTED
Tricyclic, Ur Screen: NOT DETECTED

## 2016-08-16 MED ORDER — LORAZEPAM 2 MG PO TABS: 2.0000 mg | ORAL_TABLET | Freq: Every day | ORAL | Status: DC

## 2016-08-16 MED ORDER — RISPERIDONE 1 MG PO TBDP
3.0000 mg | ORAL_TABLET | Freq: Two times a day (BID) | ORAL | Status: DC
  Administered 2016-08-16 – 2016-08-20 (×8): 3 mg via ORAL
  Filled 2016-08-16 (×9): qty 3

## 2016-08-16 MED ORDER — HALOPERIDOL 5 MG PO TABS: 5.0000 mg | ORAL_TABLET | Freq: Three times a day (TID) | ORAL | Status: DC | PRN

## 2016-08-16 MED ORDER — LORAZEPAM 2 MG/ML IJ SOLN: 2.0000 mg | INTRAMUSCULAR | Status: DC | PRN

## 2016-08-16 MED ORDER — DIPHENHYDRAMINE HCL 25 MG PO CAPS: 50.0000 mg | ORAL_CAPSULE | Freq: Four times a day (QID) | ORAL | Status: DC | PRN

## 2016-08-16 MED ORDER — DIPHENHYDRAMINE HCL 50 MG/ML IJ SOLN: 50.0000 mg | Freq: Four times a day (QID) | INTRAMUSCULAR | Status: DC | PRN

## 2016-08-16 MED ORDER — LORAZEPAM 2 MG PO TABS
2.0000 mg | ORAL_TABLET | ORAL | Status: DC | PRN
  Administered 2016-08-17 – 2016-08-20 (×2): 2 mg via ORAL
  Filled 2016-08-16 (×2): qty 1

## 2016-08-16 MED ORDER — HALOPERIDOL LACTATE 5 MG/ML IJ SOLN: 5.0000 mg | Freq: Three times a day (TID) | INTRAMUSCULAR | Status: DC | PRN

## 2016-08-16 MED ORDER — DIPHENHYDRAMINE HCL 25 MG PO CAPS
50.0000 mg | ORAL_CAPSULE | Freq: Every day | ORAL | Status: DC
  Administered 2016-08-17 – 2016-08-19 (×3): 50 mg via ORAL
  Filled 2016-08-16 (×3): qty 2

## 2016-08-16 MED ORDER — LORAZEPAM 2 MG PO TABS
2.0000 mg | ORAL_TABLET | Freq: Two times a day (BID) | ORAL | Status: DC
  Administered 2016-08-16 – 2016-08-19 (×7): 2 mg via ORAL
  Filled 2016-08-16 (×7): qty 1

## 2016-08-16 NOTE — Plan of Care (Signed)
Problem: Activity: Goal: Sleeping patterns will improve Outcome: Progressing Pt fell asleep shortly after arriving to unit, and slept most of the night.

## 2016-08-16 NOTE — Tx Team (Signed)
Interdisciplinary Treatment and Diagnostic Plan Update  08/16/2016 Time of Session: 11:30 AM LUVERN MCISAAC MRN: [id number]  Principal Diagnosis: Bipolar disorder, current episode manic severe with psychotic features [facility])  Secondary Diagnoses: Principal Problem:   Bipolar disorder, current episode manic severe with psychotic features ([place]) Active Problems:   Cannabis use disorder, severe, dependence ([place])   Current Medications:  Current Facility-Administered Medications  Medication Dose Route Frequency Provider Last Rate Last Dose  . acetaminophen (TYLENOL) tablet 650 mg  650 mg Oral Q6H PRN Gonzella Lex, MD      . alum & mag hydroxide-simeth (MAALOX/MYLANTA) 200-200-20 MG/5ML suspension 30 mL  30 mL Oral Q4H PRN Gonzella Lex, MD      . diphenhydrAMINE (BENADRYL) capsule 50 mg  50 mg Oral Q6H PRN Hildred Priest, MD       Or  . diphenhydrAMINE (BENADRYL) injection 50 mg  50 mg Intramuscular Q6H PRN Hildred Priest, MD      . haloperidol (HALDOL) tablet 5 mg  5 mg Oral Q8H PRN Hildred Priest, MD       Or  . haloperidol lactate (HALDOL) injection 5 mg  5 mg Intramuscular Q8H PRN Hildred Priest, MD      . LORazepam (ATIVAN) tablet 2 mg  2 mg Oral Q2H PRN Hildred Priest, MD       Or  . LORazepam (ATIVAN) injection 2 mg  2 mg Intramuscular Q2H PRN Hildred Priest, MD      . LORazepam (ATIVAN) tablet 2 mg  2 mg Oral BID Hildred Priest, MD   2 mg at 08/16/16 1017  . magnesium hydroxide (MILK OF MAGNESIA) suspension 30 mL  30 mL Oral Daily PRN Gonzella Lex, MD      . risperiDONE (RISPERDAL M-TABS) disintegrating tablet 3 mg  3 mg Oral BID Hildred Priest, MD       PTA Medications: No prescriptions prior to admission.    Patient Stressors: Marital or family conflict  Patient Strengths: Active sense of humor Average or above average intelligence Physical Health  Treatment Modalities:  Medication Management, Group therapy, Case management,  1 to 1 session with clinician, Psychoeducation, Recreational therapy.   Physician Treatment Plan for Primary Diagnosis: Bipolar disorder, current episode manic severe with psychotic features ([place]) Long Term Goal(s): Improvement in symptoms so as ready for discharge Improvement in symptoms so as ready for discharge   Short Term Goals: Ability to identify changes in lifestyle to reduce recurrence of condition will improve Ability to demonstrate self-control will improve Ability to identify and develop effective coping behaviors will improve Ability to identify triggers associated with substance abuse/mental health issues will improve Ability to identify changes in lifestyle to reduce recurrence of condition will improve Ability to identify triggers associated with substance abuse/mental health issues will improve  Medication Management: Evaluate patient's response, side effects, and tolerance of medication regimen.  Therapeutic Interventions: 1 to 1 sessions, Unit Group sessions and Medication administration.  Evaluation of Outcomes: Not Met  Physician Treatment Plan for Secondary Diagnosis: Principal Problem:   Bipolar disorder, current episode manic severe with psychotic features ([place]) Active Problems:   Cannabis use disorder, severe, dependence ([place])  Long Term Goal(s): Improvement in symptoms so as ready for discharge Improvement in symptoms so as ready for discharge   Short Term Goals: Ability to identify changes in lifestyle to reduce recurrence of condition will improve Ability to demonstrate self-control will improve Ability to identify and develop effective coping behaviors will improve Ability to identify  triggers associated with substance abuse/mental health issues will improve Ability to identify changes in lifestyle to reduce recurrence of condition will improve Ability to identify triggers associated with substance  abuse/mental health issues will improve     Medication Management: Evaluate patient's response, side effects, and tolerance of medication regimen.  Therapeutic Interventions: 1 to 1 sessions, Unit Group sessions and Medication administration.  Evaluation of Outcomes: Not Met   RN Treatment Plan for Primary Diagnosis: Bipolar disorder, current episode manic severe with psychotic features ([place]) Long Term Goal(s): Knowledge of disease and therapeutic regimen to maintain health will improve  Short Term Goals: Ability to remain free from injury will improve, Ability to verbalize frustration and anger appropriately will improve, Ability to demonstrate self-control, Ability to identify and develop effective coping behaviors will improve and Compliance with prescribed medications will improve  Medication Management: RN will administer medications as ordered by provider, will assess and evaluate patient's response and provide education to patient for prescribed medication. RN will report any adverse and/or side effects to prescribing provider.  Therapeutic Interventions: 1 on 1 counseling sessions, Psychoeducation, Medication administration, Evaluate responses to treatment, Monitor vital signs and CBGs as ordered, Perform/monitor CIWA, COWS, AIMS and Fall Risk screenings as ordered, Perform wound care treatments as ordered.  Evaluation of Outcomes: Not Met   LCSW Treatment Plan for Primary Diagnosis: Bipolar disorder, current episode manic severe with psychotic features ([place]) Long Term Goal(s): Safe transition to appropriate next level of care at discharge, Engage patient in therapeutic group addressing interpersonal concerns.  Short Term Goals: Engage patient in aftercare planning with referrals and resources, Increase ability to appropriately verbalize feelings, Increase emotional regulation, Facilitate acceptance of mental health diagnosis and concerns and Increase skills for wellness and  recovery  Therapeutic Interventions: Assess for all discharge needs, 1 to 1 time with Social worker, Explore available resources and support systems, Assess for adequacy in community support network, Educate family and significant other(s) on suicide prevention, Complete Psychosocial Assessment, Interpersonal group therapy.  Evaluation of Outcomes: Not Met   Progress in Treatment: Attending groups: No. Participating in groups: No. Taking medication as prescribed: Yes. Toleration medication: Yes. Family/Significant other contact made: No, will contact:  collaterals w patient consent Patient understands diagnosis: No. and As evidenced by:  limited insight and judgment Discussing patient identified problems/goals with staff: Yes. Medical problems stabilized or resolved: Yes. Denies suicidal/homicidal ideation: Yes. Issues/concerns per patient self-inventory: No. Other: na  New problem(s) identified: No, Describe:  none at this time  New Short Term/Long Term Goal(s):  Discharge Plan or Barriers: patient too symptomatic to attend treatment team or discuss discharge options  Reason for Continuation of Hospitalization: Aggression Anxiety Medication stabilization  Estimated Length of Stay: 5 - 7 days  Attendees: Patient:  08/16/2016 4:53 PM  Physician: Judieth Keens, MD 08/16/2016 4:53 PM  Nursing: Silva Bandy RN 08/16/2016 4:53 PM  RN Care Manager: 08/16/2016 4:53 PM  Social Worker: Eusebio Me LCSW 08/16/2016 4:53 PM  Recreational Therapist: Kristine Linea LRT 08/16/2016 4:53 PM  Other:  08/16/2016 4:53 PM  Other:  08/16/2016 4:53 PM  Other: 08/16/2016 4:53 PM    Scribe for Treatment Team: Beverely Pace, LCSW 08/16/2016 4:53 PM

## 2016-08-16 NOTE — Progress Notes (Signed)
Pt calmer this afternoon with less redirection needed. Does continue to need redirection, but not as often. Improvement noted in inappropriateness. Medication complaint. Safety maintained. Will continue to monitor.

## 2016-08-16 NOTE — Plan of Care (Signed)
Problem: Education: Goal: Knowledge of the prescribed therapeutic regimen will improve Outcome: Not Progressing Pt redirected constantly for sexually inappropriate behaviors, hyperactivity. Informed pt of treatment plan, verbalizes understanding. Medication complaint.

## 2016-08-16 NOTE — [facility] Suicide Risk Assessment (Signed)
[facility]   Nursing information obtained from:  Patient Demographic factors:  Male, Adolescent or young adult Current Mental Status:  NA (Pt denies) Loss Factors:  NA Historical Factors:  Family history of mental illness or substance abuse Risk Reduction Factors:  Sense of responsibility to family, Employed, Living with another person, especially a relative, Positive social support  Total Time spent with patient: 1 hour Principal Problem: Bipolar disorder, current episode manic severe with psychotic features (HCC) Diagnosis:   Patient Active Problem List   Diagnosis Date Noted  . Cannabis use disorder, severe, dependence (HCC) [F12.20] 08/16/2016  . Bipolar disorder, current episode manic severe with psychotic features (HCC) [F31.2] 08/16/2016   Subjective Data:   Continued Clinical Symptoms:  Alcohol Use Disorder Identification Test Final Score (AUDIT): 0 The "Alcohol Use Disorders Identification Test", Guidelines for Use in Primary Care, Second Edition.  World Science writerHealth Organization [facility](WHO). Score between 0-7:  no or low risk or alcohol related problems. Score between 8-15:  moderate risk of alcohol related problems. Score between 16-19:  high risk of alcohol related problems. Score 20 or above:  warrants further diagnostic evaluation for alcohol dependence and treatment.   CLINICAL FACTORS:   Severe Anxiety and/or Agitation Alcohol/Substance Abuse/Dependencies Currently Psychotic   Musculoskeletal:  Psychiatric Specialty Exam: Physical Exam  ROS  Blood pressure 137/72, pulse 70, temperature 98.2 F (36.8 C), temperature source Oral, resp. rate 18, height 6\' 2"  (1.88 m), weight 63.5 kg (140 lb), SpO2 98 %.Body mass index is 17.97 kg/m.                                                    Sleep:  Number of Hours: 4.5      COGNITIVE FEATURES THAT CONTRIBUTE TO RISK:  Loss of executive function    SUICIDE RISK:   Mild:   Suicidal ideation of limited frequency, intensity, duration, and specificity.  There are no identifiable plans, no associated intent, mild dysphoria and related symptoms, good self-control (both objective and subjective assessment), few other risk factors, and identifiable protective factors, including available and accessible social support.   [facility]  I certify that inpatient services furnished can reasonably be expected to improve the patient's condition.  Jimmy FootmanHernandez-Gonzalez,  Lakiah Dhingra, MD 08/16/2016, 2:48 PM

## 2016-08-16 NOTE — Progress Notes (Signed)
Pt extremely hyperverbal, hyperactive, and sexually inappropriate with staff and peers. Redirected multiple times. Focused on discharging, states goal for today is "to get out of here by walking out." On approach, pt turns every statement said to him into a sexually inappropriate statement. Denies SI/HI/AVH. Disorganized with flight of ideas, and must be consistently redirected. During medication administration, pt was acting silly, stating he did not want to take "drugs." Attempted to hide medications in his hands, switching the medications from one hand to the other. Was compliant with taking the medications. Reports anxiety, depression, and hopelessness is 0/10 (low 0-10 high), good sleep, good appetite, normal energy and good concentration.   Safety maintained with every 15 minute checks. Support and encouragement provided with use of therapeutic communication. Medications administered as ordered. Will continue to monitor.

## 2016-08-16 NOTE — Progress Notes (Signed)
EKG performed. Results given to [facility], [place]

## 2016-08-16 NOTE — Tx Team (Signed)
Initial Treatment Plan 08/16/2016 5:12 AM Anselm PancoastWilliam J Bowens ZOX:[id number]    PATIENT STRESSORS: Marital or family conflict   PATIENT STRENGTHS: Active sense of humor Average or above average intelligence Physical Health   PATIENT IDENTIFIED PROBLEMS: Paranoia  Insomnia  Psychosis  "When can I leave?"               DISCHARGE CRITERIA:  Improved stabilization in mood, thinking, and/or behavior Motivation to continue treatment in a less acute level of care  PRELIMINARY DISCHARGE PLAN: Outpatient therapy  PATIENT/FAMILY INVOLVEMENT: This treatment plan has been presented to and reviewed with the patient, Christiana FuchsWilliam J Bello.  The patient and family have been given the opportunity to ask questions and make suggestions.  Rockie NeighboursLuke B Rishikesh Khachatryan, RN 08/16/2016, 5:12 AM

## 2016-08-16 NOTE — Progress Notes (Signed)
Recreation Therapy Notes  Date: 10.13.17 Time: 9:30 am Location: Craft Room  Group Topic: Self-expression/Coping Skills  Goal Area(s) Addresses:  Patient will effectively use art as a means of self-expression. Patient will recognize positive benefit of self-expression. Patient will be able to identify one emotion experienced during group session. Patient will identify use of art/self-expression as a coping skill.  Behavioral Response: Attentive, Interactive, Disruptive, Inappropriate   Intervention: Two Faces of Me  Activity: Patients were given a blank face worksheet and instructed to draw a line down the middle. On one side, patients were instructed to draw or write how they felt when they were admitted to the hospital and on the other side, patients were instructed to draw or write how they want to feel when they are d/c.  Education: LRT educated patients on other forms of self-expression.  Education Outcome: Acknowledges education/In group clarification offered  Clinical Observations/Feedback: Patient completed activity by writing how he felt when he was admitted and how he wanted to feel when he was d/c. Patient was talking constantly during group activity. LRT redirect patient multiple times. Patient difficult to redirect. Patient was talking about beautiful women and blunts. LRT attempted to redirect patient. Patient difficult to redirect. Patient did contribute to group discussion by stating how his faces were different, and what makes art a good coping skill. Patient would make statements that he was bored and it wasn't a group if people did not talk. LRT attempted to redirect patient. Patient difficult to redirect.  Jacquelynn CreeGreene,Shilah Hefel M, LRT/CTRS 08/16/2016 10:28 AM

## 2016-08-16 NOTE — BHH Group Notes (Signed)
[facility] LCSW Group Therapy  08/16/2016 1pm Type of Therapy: Group Therapy Participation Level: Active  Participation Quality: Attentive, Sharing and Supportive  Affect: Appropriate  Cognitive: Alert and Oriented  Insight: Developing/Improving and Engaged  Engagement in Therapy: Developing/Improving and Engaged  Modes of Intervention: Clarification, Confrontation, Discussion, Education, Exploration, Limit-setting, Orientation, Problem-solving, Rapport Building, Dance movement psychotherapisteality Testing, Socialization and Support  Summary of Progress/Problems: The topic for today was feelings about relapse. Pt discussed what relapse prevention is to them and identified triggers that they are on the path to relapse. Pt processed their feeling towards relapse and was able to relate to peers. Pt discussed coping skills that can be used for relapse prevention.   This patient requests to be called Goat- he was redirected several times today by LCSW. He disclosed that smoking weed MJ that this helps him.LCSW shut the conversation down and reviewed the harmful affects of MJ use. Patient didn't want to hear but listened anyway. His peers endorsed the harmful out comes of drug use. Patient requested to leave group early. Ermon Sagan [place], LCSW

## 2016-08-16 NOTE — B[facility] Group Notes (Signed)
[facility] Group Notes:  (Nursing/MHT/Case Management/Adjunct)  Date:  08/16/2016  Time:  4:31 PM  Type of Therapy:  Psychoeducational Skills  Participation Level:  Did Not Attend   [name] [name] 08/16/2016, 4:31 PM

## 2016-08-16 NOTE — Progress Notes (Signed)
D: Pt received from [facility]. Pt had no skin or contraband issues Patient alert and oriented x4. Patient denies SI/HI/VH. Pt endorsed hearing voices that he indicated are sometimes negative but mostly positive. It was hard to determine if pt was actually hearing voices or talking about his "conscience" as he did mention that. Pt affect is silly and irritable. Pt was sexually inappropriate with male staff when first arriving to unit. Pt was flirtatious with staff, calling them "baby" and rubbing his nipples. Pt at first was irritable in regards to being on the unit and having to do a skin check. Pt was redirectable though. Pt mentioned he was "at home rolling weed and watching TV when the cops knocked on my door." Pt described a hard to follow chain of events involving trying to start a car, knocking on his neighbors doors, and his family calling him that led to the police bringing him to the unit. Pt did mentioned that "I felt like I was speaking to the TV" before the police arrived, but indicated it was from smoking marijuana A: Performed skin and contraband check with Teacher, musicBukola RN. Educated pt on unit policy. Reviewed admission material with pt. Oriented pt to unit. Offered active listening and support. Provided therapeutic communication. Administered scheduled medications. Educated pt on appropriate behavior, and told pt sexually inappropriate behavior was not allowed. R: Pt became more cooperative and pleasant during assessment. Pt acknowledged that sexually inappropriate behavior was not acceptable. Pt still somewhat disorganized with flight of ideas, but redirectable.  Pt medication compliant. Will continue Q15 min. checks. Safety maintained.

## 2016-08-16 NOTE — BHH Counselor (Signed)
PSA not attempted due to patient's medical and psychiatric symptoms.  Will need to be reattempted tomorrow.  Santa GeneraAnne Ember Gottwald, LCSW Lead Clinical Social Worker Phone:  [phone]

## 2016-08-16 NOTE — H&P (Signed)
Psychiatric Admission Assessment Adult  Patient Identification: Leonard Davidson MRN:  [id number] Date of Evaluation:  08/16/2016 Chief Complaint:  Schizophrenia Principal Diagnosis: Bipolar disorder, current episode manic severe with psychotic features [facility]) Diagnosis:   Patient Active Problem List   Diagnosis Date Noted  . Cannabis use disorder, severe, dependence (HCC) [F12.20] 08/16/2016  . Bipolar disorder, current episode manic severe with psychotic features (HCC) [F31.2] 08/16/2016   History of Present Illness:   The patient is a 20 year old single African-American male with no prior psychiatric history who presented under petition to our emergency department on October 11.  The petition states "locked himself in the house -not knowing his mother -not sleeping or eating- up all night- thinks people watching or talking about him".  The petitioner was Sonnie Alamo patient's mother phone number [phone]  At arrival in the emergency department the patient was refusing to answer questions, he was a staring off into space and was not following commands.  Head CT was negative. Urine toxicology was positive for cannabis and benzos, alcohol was below detection limit.  Per collateral information obtained in the emergency department his family reports that patient has been showing bizarre behavior for about 4 months.  He is not leaving the house he is not communicative with others he is rarely sleeping and his stays up all night. Mother reports that the patient never really has developed normally since graduating high school. He only works about a day a week and mostly stays home watching video games. She does not know whether or what his substance abuse pattern might be. Evidently family has tried to get him to see a mental health professional but he has refused up until now.  Since admission and the nursing staff reports the patient has been hypersexual making frequently inappropriate  comments towards male staff while robbing his nipples.  During assessment to day he was is feeling agitated labile and he threatened to "break out" from the unit. He said that if he was not going to be discharged today things were going to get ugly "you don't want to se me mad".  Patient is intrusive, displays psychomotor agitation, is hypersexual. Family is reporting decreased need for sleep.  He also is displaying paranoia and thinks that people here in the unit and outside were trying to harm him.  Patient denies having any auditory or visual hallucinations. Denies suicidality homicidality or problems with mood, appetite, energy is sleep or concentration. He says that he has been just sleeping fine at home and says that all of the things reported by his family are lies.  As far as substance abuse the patient said that he has been smoking marijuana since age 7. He smokes daily. He denies the use of any other illicit substances, recent prescription medications or abusing alcohol. He also denies smoking cigarettes.  Trauma was not assessed as patient was easily agitated.  Associated Signs/Symptoms: Depression Symptoms:  denies (Hypo) Manic Symptoms:  Delusions, Elevated Mood, Impulsivity, Labiality of Mood, Sexually Inapproprite Behavior, Anxiety Symptoms:  denies Psychotic Symptoms:  Paranoia, PTSD Symptoms: NA Total Time spent with patient: 1 hour  Past Psychiatric History: No prior history of psychiatric hospitalizations, no history of self-injurious behaviors or suicidal attempts. No prior history or any type of psychiatric illnesses  Is the patient at risk to self? Yes.    Has the patient been a risk to self in the past 6 months? No.  Has the patient been a risk to self within the distant  past? No.  Is the patient a risk to others? No.  Has the patient been a risk to others in the past 6 months? No.  Has the patient been a risk to others within the distant past? No.     Alcohol Screening: 1. How often do you have a drink containing alcohol?: Never 9. Have you or someone else been injured as a result of your drinking?: No 10. Has a relative or friend or a doctor or another health worker been concerned about your drinking or suggested you cut down?: No Alcohol Use Disorder Identification Test Final Score (AUDIT): 0 Brief Intervention: AUDIT score less than 7 or less-screening does not suggest unhealthy drinking-brief intervention not indicated  Past Medical History: History reviewed. No pertinent past medical history. History reviewed. No pertinent surgical history.   Family History: History reviewed. No pertinent family history.   Family Psychiatric  History: Please that his grandfather had issues with depression and anxiety  Tobacco Screening: Have you used any form of tobacco in the last 30 days? (Cigarettes, Smokeless Tobacco, Cigars, and/or Pipes): No   Social History: Single, never married, doesn't have any children. Lives with his mother and sometimes with his grandparents. He works doing dishes one day a week. History  Alcohol Use No     History  Drug Use  . Types: Marijuana     Allergies:  No Known Allergies   Lab Results:  Results for orders placed or performed during the hospital encounter of 08/15/16 (from the past 48 hour(s))  Urine Drug Screen, Qualitative     Status: Abnormal   Collection Time: 08/15/16  7:16 AM  Result Value Ref Range   Tricyclic, Ur Screen NONE DETECTED NONE DETECTED   Amphetamines, Ur Screen NONE DETECTED NONE DETECTED   MDMA (Ecstasy)Ur Screen NONE DETECTED NONE DETECTED   Cocaine Metabolite,Ur Maceo NONE DETECTED NONE DETECTED   Opiate, Ur Screen NONE DETECTED NONE DETECTED   Phencyclidine (PCP) Ur S NONE DETECTED NONE DETECTED   Cannabinoid 50 Ng, Ur Panora POSITIVE (A) NONE DETECTED   Barbiturates, Ur Screen NONE DETECTED NONE DETECTED   Benzodiazepine, Ur Scrn POSITIVE (A) NONE DETECTED   Methadone Scn, Ur  NONE DETECTED NONE DETECTED    Comment: (NOTE) 100  Tricyclics, urine               Cutoff 1000 ng/mL 200  Amphetamines, urine             Cutoff 1000 ng/mL 300  MDMA (Ecstasy), urine           Cutoff 500 ng/mL 400  Cocaine Metabolite, urine       Cutoff 300 ng/mL 500  Opiate, urine                   Cutoff 300 ng/mL 600  Phencyclidine (PCP), urine      Cutoff 25 ng/mL 700  Cannabinoid, urine              Cutoff 50 ng/mL 800  Barbiturates, urine             Cutoff 200 ng/mL 900  Benzodiazepine, urine           Cutoff 200 ng/mL 1000 Methadone, urine                Cutoff 300 ng/mL 1100 1200 The urine drug screen provides only a preliminary, unconfirmed 1300 analytical test result and should not be used for non-medical 1400 purposes. Clinical consideration and  professional judgment should 1500 be applied to any positive drug screen result due to possible 1600 interfering substances. A more specific alternate chemical method 1700 must be used in order to obtain a confirmed analytical result.  1800 Gas chromato graphy / mass spectrometry (GC/MS) is the preferred 1900 confirmatory method.     Blood Alcohol level:  Lab Results  Component Value Date   ETH <5 08/14/2016    Metabolic Disorder Labs:  No results found for: HGBA1C, MPG No results found for: PROLACTIN No results found for: CHOL, TRIG, HDL, CHOLHDL, VLDL, LDLCALC  Current Medications: Current Facility-Administered Medications  Medication Dose Route Frequency Provider Last Rate Last Dose  . acetaminophen (TYLENOL) tablet 650 mg  650 mg Oral Q6H PRN Audery Amel, MD      . alum & mag hydroxide-simeth (MAALOX/MYLANTA) 200-200-20 MG/5ML suspension 30 mL  30 mL Oral Q4H PRN Audery Amel, MD      . diphenhydrAMINE (BENADRYL) capsule 50 mg  50 mg Oral Q6H PRN Jimmy Footman, MD       Or  . diphenhydrAMINE (BENADRYL) injection 50 mg  50 mg Intramuscular Q6H PRN Jimmy Footman, MD      . haloperidol  (HALDOL) tablet 5 mg  5 mg Oral Q8H PRN Jimmy Footman, MD       Or  . haloperidol lactate (HALDOL) injection 5 mg  5 mg Intramuscular Q8H PRN Jimmy Footman, MD      . LORazepam (ATIVAN) tablet 2 mg  2 mg Oral Q2H PRN Jimmy Footman, MD       Or  . LORazepam (ATIVAN) injection 2 mg  2 mg Intramuscular Q2H PRN Jimmy Footman, MD      . LORazepam (ATIVAN) tablet 2 mg  2 mg Oral BID Jimmy Footman, MD   2 mg at 08/16/16 1017  . magnesium hydroxide (MILK OF MAGNESIA) suspension 30 mL  30 mL Oral Daily PRN Audery Amel, MD      . risperiDONE (RISPERDAL M-TABS) disintegrating tablet 3 mg  3 mg Oral BID Jimmy Footman, MD       PTA Medications: No prescriptions prior to admission.    Musculoskeletal: Strength & Muscle Tone: within normal limits Gait & Station: normal Patient leans: N/A  Psychiatric Specialty Exam: Physical Exam  Constitutional: He is oriented to person, place, and time. He appears well-developed and well-nourished.  HENT:  Head: Normocephalic and atraumatic.  Eyes: Conjunctivae and EOM are normal.  Neck: Normal range of motion.  Respiratory: Effort normal.  Musculoskeletal: Normal range of motion.  Neurological: He is alert and oriented to person, place, and time.    Review of Systems  Constitutional: Negative.   HENT: Negative.   Eyes: Negative.   Respiratory: Negative.   Cardiovascular: Negative.   Gastrointestinal: Negative.   Genitourinary: Negative.   Musculoskeletal: Negative.   Skin: Negative.   Neurological: Negative.   Endo/Heme/Allergies: Negative.   Psychiatric/Behavioral: Negative.     Blood pressure 137/72, pulse 70, temperature 98.2 F (36.8 C), temperature source Oral, resp. rate 18, height 6\' 2"  (1.88 m), weight 63.5 kg (140 lb), SpO2 98 %.Body mass index is 17.97 kg/m.  General Appearance: Fairly Groomed  Eye Contact:  Good  Speech:  Pressured  Volume:  Increased  Mood:   Euphoric and Irritable  Affect:  Labile  Thought Process:  Irrelevant and Descriptions of Associations: Tangential  Orientation:  Full (Time, Place, and Person)  Thought Content:  Paranoid Ideation  Suicidal Thoughts:  No  Homicidal  Thoughts:  No  Memory:  Immediate;   Fair Recent;   Fair Remote;   Fair  Judgement:  Impaired  Insight:  Lacking  Psychomotor Activity:  Increased  Concentration:  Concentration: Poor and Attention Span: Poor  Recall:  FiservFair  Fund of Knowledge:  Fair  Language:  Good  Akathisia:  No  Handed:    AIMS (if indicated):     Assets:  Engineer, maintenanceCommunication Skills Housing Physical Health Social Support  ADL's:  Intact  Cognition:  WNL  Sleep:  Number of Hours: 4.5    Treatment Plan Summary:  Patient has been having bizarre behavior for the last 4 months. Today during assessment the patient is clearly displaying signs and symptoms of mania. He also appears to have some paranoia saying that people outside the unit and her were trying to harm him  Bipolar disorder type I current episode manic with psychotic features: This patient has been started on Risperdal 3 mg by mouth twice a day. Patient has never been treated with any antipsychotics or mood stabilizers. I will see his response to Risperdal before adding a mood stabilizer such as Depakote and lithium or Tegretol  Agitation I have order Ativan 2 mg by mouth twice a day. There are also orders for Haldol, Benadryl and Ativan as needed every 6 hours in case of aggression and agitation  Insomnia: As a mention about patient is receiving 2 mg of Ativan at bedtime  Cannabis use disorder severe: Patient in need of substance abuse treatment upon discharge  Diet regular  Precautions for now we will continue with every 15 minute checks however we will have a very low threshold to start a one-to-one as patient has been labile, has made threats in has been sexually inappropriate  Hospitalization status continue  involuntary commitment  Vital signs daily  Labs hemoglobin A1c, lipid panel and TSH have been ordered but there currently pending as the patient refused blood work this morning.      Physician Treatment Plan for Primary Diagnosis: Bipolar disorder, current episode manic severe with psychotic features (HCC) Long Term Goal(s): Improvement in symptoms so as ready for discharge  Short Term Goals: Ability to identify changes in lifestyle to reduce recurrence of condition will improve, Ability to demonstrate self-control will improve, Ability to identify and develop effective coping behaviors will improve and Ability to identify triggers associated with substance abuse/mental health issues will improve  Physician Treatment Plan for Secondary Diagnosis: Principal Problem:   Bipolar disorder, current episode manic severe with psychotic features (HCC) Active Problems:   Cannabis use disorder, severe, dependence (HCC)  Long Term Goal(s): Improvement in symptoms so as ready for discharge  Short Term Goals: Ability to identify changes in lifestyle to reduce recurrence of condition will improve and Ability to identify triggers associated with substance abuse/mental health issues will improve  I certify that inpatient services furnished can reasonably be expected to improve the patient's condition.    Jimmy FootmanHernandez-Gonzalez,  Hebe Merriwether, MD 10/13/20172:47 PM

## 2016-08-17 LAB — TSH: TSH: 0.735 u[IU]/mL (ref 0.350–4.500)

## 2016-08-17 LAB — LIPID PANEL
Cholesterol: 119 mg/dL (ref 0–200)
HDL: 32 mg/dL — ABNORMAL LOW (ref >40)
LDL CALC: 76 mg/dL (ref 0–99)
Total CHOL/HDL Ratio: 3.7 RATIO
Triglycerides: 55 mg/dL (ref <150)
VLDL: 11 mg/dL (ref 0–40)

## 2016-08-17 NOTE — [facility] Group Notes (Signed)
[facility] Group Notes:  (Nursing/MHT/Case Management/Adjunct)  Date:  08/17/2016  Time:  9:50 PM  Type of Therapy:  Evening Wrap-up Group  Participation Level:  Did Not Attend  Participation Quality:  N/A  Affect:  N/A  Cognitive:  N/A  Insight:  None  Engagement in Group: Did Not Attend  Modes of Intervention:  Discussion  Summary of Progress/Problems:  Tomasita MorrowChelsea Nanta Ramya Vanbergen 08/17/2016, 9:50 PM

## 2016-08-17 NOTE — Progress Notes (Signed)
Patient is hyper verbal and intrusive.  Requires frequent redirection.  Fixated on going home.  States that it is boring here.  Speech rapid, tangential and has flight of ideas.  Medication compliant.  Denies SI/HI/AVH.  Support and encouragement offered, safety maintained.

## 2016-08-17 NOTE — Progress Notes (Signed)
M.D. attempted to see the patient. Unable to arouse him as he was sleeping soundly. Discussed with the RN. Apparently patient has been hypersexual and requiring frequent redirection. He was given 2 mg of Ativan. Discussed with the staff about his medications. Will re- evaluated the patient again tomorrow morning.

## 2016-08-17 NOTE — Plan of Care (Signed)
Problem: Activity: Goal: Interest or engagement in activities will improve Outcome: Not Progressing Pt slept all shift.

## 2016-08-17 NOTE — Progress Notes (Signed)
D: Observed pt in bed sleeping. Patient alert and oriented x4. Patient denies SI/HI/AVH. Pt affect hypersexual and silly when pt got up, but pt was asleep through the whole shift. Pt was difficult to arose during the evening, and pt ended up sleeping all shift. Pt got up briefly, but fell right back asleep.  A: Offered active listening and support. Provided therapeutic communication. Benadryl held as pt was in deep sleep. R: Pt pleasant and cooperative. Pt medication compliant. Will continue Q15 min. checks. Safety maintained.

## 2016-08-17 NOTE — [facility] Counselor (Addendum)
CSW attempted PSA.  Unable to arouse Pt who was sleeping very soundly.  Staffed with RN, apparently Pt still hypersexual and requiring frequent redirection was given 2 mg of ativan and she recommended allowing Pt to rest.  Will request CSW tomorrow attempt if possible.  Leonard BalesSara Deontra Pereyra,MSW, LCSW

## 2016-08-18 DIAGNOSIS — F1220 Cannabis dependence, uncomplicated: Secondary | ICD-10-CM

## 2016-08-18 LAB — HEMOGLOBIN A1C
Hgb A1c MFr Bld: 5.3 % (ref 4.8–5.6)
Mean Plasma Glucose: 105 mg/dL

## 2016-08-18 NOTE — [facility] Group Notes (Signed)
[facility] LCSW Group Therapy  08/18/2016 2:34 PM  Type of Therapy:  Group Therapy  Participation Level:  Did Not Attend  Modes of Intervention:  Activity, Discussion, Education, Socialization and Support  Summary of Progress/Problems: Balance in life: Patients will discuss the concept of balance and how it looks and feels to be unbalanced. Pt will identify areas in their life that is unbalanced and ways to become more balanced. CSW asked pt to leave group. Pt refused to follow group rules. He was laughing inappropriately and was a distraction to other group members.   Daisy Floroandace L Samuell Knoble MSW, LCSWA  08/18/2016, 2:34 PM

## 2016-08-18 NOTE — Progress Notes (Signed)
Leonard Davidson spent most of the evening sleeping, he was medication compliant, he requires some redirection to stay on task, he was medication compliant. He appears to be resting quietly in bed at this timre.

## 2016-08-18 NOTE — [facility] Group Notes (Signed)
[facility] Group Notes:  (Nursing/MHT/Case Management/Adjunct)  Date:  08/18/2016  Time:  9:54 PM  Type of Therapy:  Group Therapy  Participation Level:  Active  Participation Quality:  Appropriate  Affect:  Appropriate  Cognitive:  Appropriate  Insight:  Appropriate  Engagement in Group:  Engaged  Modes of Intervention:  Discussion  Summary of Progress/Problems:  Burt EkJanice Marie Tearsa Kowalewski 08/18/2016, 9:54 PM

## 2016-08-18 NOTE — Progress Notes (Signed)
[facility] MD Progress Note  08/18/2016 10:54 PM ANTION ANDRES  MRN:  [id number] Subjective:    The patient is a 20 year old single African-American male with no prior psychiatric history who presented under petition to our emergency department on October 11.  The petition states "locked himself in the house -not knowing his mother -not sleeping or eating- up all night- thinks people watching or talking about him".  The petitioner was Sonnie Alamo patient's mother phone number [phone]  At arrival in the emergency department the patient was refusing to answer questions, he was a staring off into space and was not following commands.  Head CT was negative. Urine toxicology was positive for cannabis and benzos, alcohol was below detection limit.   During my interview today patient reported that he was using "Germaine Pomfret- MJ"  for the past few months. He reported that he has never used cocaine. He was laughing inappropriately. He reported that his mom brought him to the hospital. He is ready to go. He stated that he does not need to be in the hospital. He has poor insight into his issues. He appeared hyperactive and was laughing inappropriately. Appears to be responding to internal stimuli at this time.       Principal Problem: Bipolar disorder, current episode manic severe with psychotic features [facility]) Diagnosis:   Patient Active Problem List   Diagnosis Date Noted  . Cannabis use disorder, severe, dependence (HCC) [F12.20] 08/16/2016  . Bipolar disorder, current episode manic severe with psychotic features (HCC) [F31.2] 08/16/2016   Total Time spent with patient: 20 minutes  Past Psychiatric History:  No prior history of psychiatric hospitalizations, no history of self-injurious behaviors or suicidal attempts. No prior history or any type of psychiatric illnesses    Past Medical History: History reviewed. No pertinent past medical history. History reviewed. No pertinent  surgical history. Family History: History reviewed. No pertinent family history. Family Psychiatric  History:  Social History:  History  Alcohol Use No     History  Drug Use  . Types: Marijuana    Social History   Social History  . Marital status: Single    Spouse name: N/A  . Number of children: N/A  . Years of education: N/A   Social History Main Topics  . Smoking status: Never Smoker  . Smokeless tobacco: Never Used  . Alcohol use No  . Drug use:     Types: Marijuana  . Sexual activity: Not Asked   Other Topics Concern  . None   Social History Narrative  . None   Additional Social History:                         Sleep: Fair  Appetite:  Fair  Current Medications: Current Facility-Administered Medications  Medication Dose Route Frequency Provider Last Rate Last Dose  . acetaminophen (TYLENOL) tablet 650 mg  650 mg Oral Q6H PRN Audery Amel, MD      . alum & mag hydroxide-simeth (MAALOX/MYLANTA) 200-200-20 MG/5ML suspension 30 mL  30 mL Oral Q4H PRN Audery Amel, MD      . diphenhydrAMINE (BENADRYL) capsule 50 mg  50 mg Oral Q6H PRN Jimmy Footman, MD       Or  . diphenhydrAMINE (BENADRYL) injection 50 mg  50 mg Intramuscular Q6H PRN Jimmy Footman, MD      . diphenhydrAMINE (BENADRYL) capsule 50 mg  50 mg Oral QHS Jimmy Footman, MD   50 mg  at 08/18/16 2111  . haloperidol (HALDOL) tablet 5 mg  5 mg Oral Q8H PRN Jimmy FootmanAndrea Hernandez-Gonzalez, MD       Or  . haloperidol lactate (HALDOL) injection 5 mg  5 mg Intramuscular Q8H PRN Jimmy FootmanAndrea Hernandez-Gonzalez, MD      . LORazepam (ATIVAN) tablet 2 mg  2 mg Oral Q2H PRN Jimmy FootmanAndrea Hernandez-Gonzalez, MD   2 mg at 08/17/16 2219   Or  . LORazepam (ATIVAN) injection 2 mg  2 mg Intramuscular Q2H PRN Jimmy FootmanAndrea Hernandez-Gonzalez, MD      . LORazepam (ATIVAN) tablet 2 mg  2 mg Oral BID Jimmy FootmanAndrea Hernandez-Gonzalez, MD   2 mg at 08/18/16 1643  . magnesium hydroxide (MILK OF MAGNESIA)  suspension 30 mL  30 mL Oral Daily PRN [name], MD      . risperiDONE (RISPERDAL M-TABS) disintegrating tablet 3 mg  3 mg Oral BID Jimmy FootmanAndrea Hernandez-Gonzalez, MD   3 mg at 08/18/16 1642    Lab Results:  Results for orders placed or performed during the hospital encounter of 08/15/16 (from the past 48 hour(s))  Hemoglobin A1c     Status: None   Collection Time: 08/17/16  6:56 AM  Result Value Ref Range   Hgb A1c MFr Bld 5.3 4.8 - 5.6 %    Comment: (NOTE)         Pre-diabetes: 5.7 - 6.4         Diabetes: >6.4         Glycemic control for adults with diabetes: <7.0    Mean Plasma Glucose 105 mg/dL    Comment: (NOTE) Performed At: [facility] [address] [place], [place] [id number] Mila HomerHancock Timmothy F MD YQ:[id number]:[phone]   Lipid panel     Status: Abnormal   Collection Time: 08/17/16  6:56 AM  Result Value Ref Range   Cholesterol 119 0 - 200 mg/dL   Triglycerides 55 <387<150 mg/dL   HDL 32 (L) >56>40 mg/dL   Total CHOL/HDL Ratio 3.7 RATIO   VLDL 11 0 - 40 mg/dL   LDL Cholesterol 76 0 - 99 mg/dL    Comment:        Total Cholesterol/HDL:CHD Risk Coronary Heart Disease Risk Table                     Men   Women  1/2 Average Risk   3.4   3.3  Average Risk       5.0   4.4  2 X Average Risk   9.6   7.1  3 X Average Risk  23.4   11.0        Use the calculated Patient Ratio above and the CHD Risk Table to determine the patient's CHD Risk.        ATP III CLASSIFICATION (LDL):  <100     mg/dL   Optimal  [id number]  mg/dL   Near or Above                    Optimal  130-159  mg/dL   Borderline  [id number]  mg/dL   High  >606>190     mg/dL   Very High   TSH     Status: None   Collection Time: 08/17/16  6:56 AM  Result Value Ref Range   TSH 0.735 0.350 - 4.500 uIU/mL    Comment: Performed by a 3rd Generation assay with a functional sensitivity of <=0.01 uIU/mL.    Blood Alcohol level:  Lab  Results  Component Value Date   ETH <5 08/14/2016    Metabolic Disorder Labs: Lab  Results  Component Value Date   HGBA1C 5.3 08/17/2016   MPG 105 08/17/2016   No results found for: PROLACTIN Lab Results  Component Value Date   CHOL 119 08/17/2016   TRIG 55 08/17/2016   HDL 32 (L) 08/17/2016   CHOLHDL 3.7 08/17/2016   VLDL 11 08/17/2016   LDLCALC 76 08/17/2016    Physical Findings: AIMS:  , ,  ,  ,    CIWA:    COWS:     Musculoskeletal: Strength & Muscle Tone: within normal limits Gait & Station: normal Patient leans: N/A  Psychiatric Specialty Exam: Physical Exam  ROS  Blood pressure 132/83, pulse 68, temperature 97.9 F (36.6 C), temperature source Oral, resp. rate 18, height 6\' 2"  (1.88 m), weight 140 lb (63.5 kg), SpO2 90 %.Body mass index is 17.97 kg/m.  General Appearance: Casual  Eye Contact:  Fair  Speech:  Pressured  Volume:  Normal  Mood:  Anxious  Affect:  Congruent  Thought Process:  Irrelevant  Orientation:  Full (Time, Place, and Person)  Thought Content:  Tangential  Suicidal Thoughts:  No  Homicidal Thoughts:  No  Memory:  Immediate;   Fair  Judgement:  Impaired  Insight:  Shallow  Psychomotor Activity:  Normal  Concentration:  Concentration: Fair  Recall:  [employer] of Knowledge:  Fair  Language:  Fair  Akathisia:  No  Handed:  Right  AIMS (if indicated):     Assets:  Manufacturing systems engineer Social Support  ADL's:  Intact  Cognition:  WNL  Sleep:  Number of Hours: 4.5     Treatment Plan Summary: Medication management   Patient has been having bizarre behavior for the last 4 months. Today during assessment the patient is clearly displaying signs and symptoms of mania. He also appears to have some paranoia saying that people outside the unit and her were trying to harm him  Bipolar disorder type I current episode manic with psychotic features: This patient has been started on Risperdal 3 mg by mouth twice a day. Patient has never been treated with any antipsychotics or mood stabilizers. I will see his response to  Risperdal before adding a mood stabilizer such as Depakote and lithium or Tegretol  Agitation I have order Ativan 2 mg by mouth twice a day. There are also orders for Haldol, Benadryl and Ativan as needed every 6 hours in case of aggression and agitation  Insomnia: As a mention about patient is receiving 2 mg of Ativan at bedtime  Cannabis use disorder severe: Patient in need of substance abuse treatment upon discharge  Diet regular  Precautions for now we will continue with every 15 minute checks however we will have a very low threshold to start a one-to-one as patient has been labile, has made threats in has been sexually inappropriate  Hospitalization status continue involuntary commitment  Vital signs daily  Labs hemoglobin A1c, lipid panel and TSH have been ordered but there currently pending as the patient refused blood work this morning.    Brandy Hale, MD 08/18/2016, 10:54 PM

## 2016-08-19 MED ORDER — LORAZEPAM 1 MG PO TABS: 1.0000 mg | ORAL_TABLET | Freq: Every day | ORAL | Status: DC

## 2016-08-19 NOTE — Progress Notes (Signed)
D: Somewhat intrusive and flirtatious this evening. Needed constant redirection. Preoccupied on discharge. Denies SI/HI/AVH. Visible in the milieu. Attended group and ate snack.  A: Medications given with education. Encouragement provided.  R: Patient was compliant with medication. He remains calm and cooperative. Safety maintained with 15 min checks.

## 2016-08-19 NOTE — [facility] Group Notes (Signed)
[facility] Group Notes:  (Nursing/MHT/Case Management/Adjunct)  Date:  08/19/2016  Time:  10:59 PM  Type of Therapy:  Evening Wrap-up Group  Participation Level:  Active  Participation Quality:  Appropriate and Attentive  Affect:  Appropriate  Cognitive:  Alert and Appropriate  Insight:  Appropriate, Good and Improving  Engagement in Group:  Developing/Improving and Engaged  Modes of Intervention:  Discussion  Summary of Progress/Problems:  Tomasita MorrowChelsea Nanta Iren Whipp 08/19/2016, 10:59 PM

## 2016-08-19 NOTE — Plan of Care (Signed)
Problem: Safety: Goal: Periods of time without injury will increase Outcome: Progressing Patient has been without injury during this shift.    

## 2016-08-19 NOTE — Progress Notes (Signed)
[name]  08/19/2016 2:08 PM Leonard FuchsWilliam J Davidson  MRN:  [id number] Subjective:   The patient is a 20 year old single African-American male with no prior psychiatric history who presented under petition to our emergency department on October 11. At admission the  patient was hypersexual making frequently inappropriate comments towards male staff while robbing his nipples.  During he was is feeling agitated labile and he threatened to "break out" from the unit. He said that if he was not going to be discharged today things were going to get ugly "you don't want to se me mad".  Patient reports he is doing very well. He denies problems with mood, appetite, energy, sleep or concentration. Denies suicidality, homicidality or having auditory or visual hallucinations. The patient denies side effects from medications other than sedation. The patient denies any physical complaints. Since Friday the patient has not had any episodes of aggression or agitation. He has not require seclusion, restraints or forced medications. He is not longer hypersexual or inappropriate with females.  He does appear sedated during assessment and S2 has no insight into why he was admitted to our unit.  Per nursing: D: Patient appears bright on the unit. Asks about discharge. States he's here because of his mother. Minimal discharge. No flirtatious behaviors. Denies SI/HI/AVH.  A: Medication given with education. Encouragement provided.  R: Patient was compliant with medication. He has remained calm and cooperative. Safety maintained with 15 min checks.   Principal Problem: Bipolar disorder, current episode manic severe with psychotic features [facility]) Diagnosis:   Patient Active Problem List   Diagnosis Date Noted  . Cannabis use disorder, severe, dependence (HCC) [F12.20] 08/16/2016  . Bipolar disorder, current episode manic severe with psychotic features (HCC) [F31.2] 08/16/2016   Total Time spent with patient:  30 minutes  Past Psychiatric History: No prior history of psychiatric hospitalizations, no history of self-injurious behaviors or suicidal attempts. No prior history or any type of psychiatric illnesses   Past Medical History: History reviewed. No pertinent past medical history. History reviewed. No pertinent surgical history. Family History: History reviewed. No pertinent family history.   Family Psychiatric  History: Please that his grandfather had issues with depression and anxiety  Social History:  Single, never married, doesn't have any children. Lives with his mother and sometimes with his grandparents. He works doing dishes one day a week.    History    History  Alcohol Use No     History  Drug Use  . Types: Marijuana    Social History   Social History  . Marital status: Single    Spouse name: N/A  . Number of children: N/A  . Years of education: N/A   Social History Main Topics  . Smoking status: Never Smoker  . Smokeless tobacco: Never Used  . Alcohol use No  . Drug use:     Types: Marijuana  . Sexual activity: Not Asked   Other Topics Concern  . None   Social History Narrative  . None    Current Medications: Current Facility-Administered Medications  Medication Dose Route Frequency Provider Last Rate Last Dose  . acetaminophen (TYLENOL) tablet 650 mg  650 mg Oral Q6H PRN [name], MD      . alum & mag hydroxide-simeth (MAALOX/MYLANTA) 200-200-20 MG/5ML suspension 30 mL  30 mL Oral Q4H PRN [name], MD      . diphenhydrAMINE (BENADRYL) capsule 50 mg  50 mg Oral QHS Jimmy FootmanAndrea Hernandez-Gonzalez, MD  50 mg at 08/18/16 2111  . LORazepam (ATIVAN) tablet 2 mg  2 mg Oral Q2H PRN Jimmy Footman, MD   2 mg at 08/17/16 2219   Or  . LORazepam (ATIVAN) injection 2 mg  2 mg Intramuscular Q2H PRN Jimmy Footman, MD      . [name] ON 08/20/2016] LORazepam (ATIVAN) tablet 1 mg  1 mg Oral QHS Jimmy Footman, MD      .  magnesium hydroxide (MILK OF MAGNESIA) suspension 30 mL  30 mL Oral Daily PRN Audery Amel, MD      . risperiDONE (RISPERDAL M-TABS) disintegrating tablet 3 mg  3 mg Oral BID Jimmy Footman, MD   3 mg at 08/19/16 0815    Lab Results: No results found for this or any previous visit (from the past 48 hour(s)).  Blood Alcohol level:  Lab Results  Component Value Date   ETH <5 08/14/2016    Metabolic Disorder Labs: Lab Results  Component Value Date   HGBA1C 5.3 08/17/2016   MPG 105 08/17/2016   No results found for: PROLACTIN Lab Results  Component Value Date   CHOL 119 08/17/2016   TRIG 55 08/17/2016   HDL 32 (L) 08/17/2016   CHOLHDL 3.7 08/17/2016   VLDL 11 08/17/2016   LDLCALC 76 08/17/2016    Physical Findings: AIMS:  , ,  ,  ,    CIWA:    COWS:     Musculoskeletal: Strength & Muscle Tone: within normal limits Gait & Station: normal Patient leans: N/A  Psychiatric Specialty Exam: Physical Exam  Constitutional: He is oriented to person, place, and time. He appears well-developed and well-nourished.  HENT:  Head: Normocephalic and atraumatic.  Eyes: EOM are normal.  Neck: Normal range of motion.  Respiratory: Effort normal.  Musculoskeletal: Normal range of motion.  Neurological: He is alert and oriented to person, place, and time.    Review of Systems  Constitutional: Negative.   HENT: Negative.   Eyes: Negative.   Respiratory: Negative.   Cardiovascular: Negative.   Gastrointestinal: Negative.   Genitourinary: Negative.   Musculoskeletal: Negative.   Skin: Negative.   Neurological: Negative.   Endo/Heme/Allergies: Negative.   Psychiatric/Behavioral: Positive for substance abuse. Negative for depression, hallucinations, memory loss and suicidal ideas. The patient is not nervous/anxious and does not have insomnia.     Blood pressure 119/74, pulse 60, temperature 97.9 F (36.6 C), temperature source Oral, resp. rate 18, height 6\' 2"  (1.88 m),  weight 63.5 kg (140 lb), SpO2 90 %.Body mass index is 17.97 kg/m.  General Appearance: Disheveled  Eye Contact:  Fair  Speech:  Clear and Coherent  Volume:  Normal  Mood:  Dysphoric  Affect:  Appropriate  Thought Process:  Linear and Descriptions of Associations: Intact  Orientation:  Full (Time, Place, and Person)  Thought Content:  Hallucinations: None  Suicidal Thoughts:  No  Homicidal Thoughts:  No  Memory:  Immediate;   Fair Recent;   Fair Remote;   Fair  Judgement:  Poor  Insight:  Shallow  Psychomotor Activity:  Normal  Concentration:  Concentration: Fair and Attention Span: Fair  Recall:  [employer] of Knowledge:  Fair  Language:  Good  Akathisia:  No  Handed:    AIMS (if indicated):     Assets:  Engineer, maintenance Physical Health Social Support  ADL's:  Intact  Cognition:  WNL  Sleep:  Number of Hours: 8.25     Treatment Plan Summary:  Patient has  been having bizarre behavior for the last 4 months. Today during assessment the patient is clearly displaying signs and symptoms of mania. He also appears to have some paranoia saying that people outside the unit and her were trying to harm him  Bipolar disorder type I current episode manic with psychotic features: This patient has been started on Risperdal 3 mg by mouth twice a day. Patient has never been treated with any antipsychotics or mood stabilizers. Patient seems to be responding well to Risperdal  Agitation: He hasn't been agitated since Friday. I will continue Ativan by mouth or IM as needed. Orders for Haldol and Benadryl when necessary have been discontinued  Insomnia: Patient will be recent 1 mg of Ativan daily at bedtime  Cannabis use disorder severe: Patient in need of substance abuse treatment upon discharge  Diet regular  Precautions every 15 minute checks  Hospitalization status continue involuntary commitment  Vital signs daily  Labs hemoglobin A1c, lipid panel and TSH  have been completed  Possible discharge in the next 24-48 hours.  Jimmy Footman, MD 08/19/2016, 2:08 PM

## 2016-08-19 NOTE — Progress Notes (Signed)
D: Patient appears bright on the unit. Asks about discharge. States he's here because of his mother. Minimal discharge. No flirtatious behaviors. Denies SI/HI/AVH.  A: Medication given with education. Encouragement provided.  R: Patient was compliant with medication. He has remained calm and cooperative. Safety maintained with 15 min checks.

## 2016-08-19 NOTE — Plan of Care (Signed)
Problem: Education: Goal: Mental status will improve Outcome: Progressing Pt calmer throughout shift. Less intrusive.

## 2016-08-19 NOTE — Progress Notes (Signed)
Recreation Therapy Notes  Date: 10.16.17 Time: 9:30 am Location: Craft Room  Group Topic: Self-expression  Goal Area(s) Addresses:  Patient will identify one color per emotion listed on wheel. Patient will verbalize benefit of using art as a means of self-expression. Patient will verbalize one emotion experienced during session. Patient will be educated on other forms of self-expression.  Behavioral Response: Attentive, Interactive, Intermittently Inappropraite  Intervention: Emotion Wheel  Activity: Patients were given Emotion Wheel worksheets and instructed to pick a color for each emotion listed on the wheel.  Education: LRT educated patients on different forms of self-expression.  Education Outcome: Acknowledges education/In group clarification offered  Clinical Observations/Feedback: Patient completed activity by picking colors for each emotion. Patient contributed to group discussion by stating what colors he picked and why, how it felt to list his emotions in color, what makes art a good form of self-expression, and what emotions he felt during group. When a patient talked about not being able to smoke at his group home, patient stated, "You can smoke gas." LRT redirected patient and patient complied.  Jacquelynn CreeGreene,Olivea Sonnen M, LRT/CTRS 08/19/2016 10:42 AM

## 2016-08-19 NOTE — Progress Notes (Signed)
Pt pleasant and cooperative. Anxious at beginning of shift, decreased shortly. Med and group compliant. Appropriate with staff and peers.  Encouragement and support offered. Pt receptive and remains safe on unit with q 15 min checks.

## 2016-08-19 NOTE — [facility] Group Notes (Signed)
[facility] LCSW Group Therapy   08/19/2016 1pm Type of Therapy: Group Therapy   Participation Level: Pt invited but did not attend.  Participation Quality: Pt invited but did not attend.  Summary of Progress/Problems: Pt identified obstacles faced currently and processed barriers involved in overcoming these obstacles. Pt identified steps necessary for overcoming these obstacles and explored motivation (internal and external) for facing these difficulties head on. Pt further identified one area of concern in their lives and chose a goal to focus on for today.     Leonard Davidson, MSW, LCSWA 08/19/2016, 2:03PM

## 2016-08-19 NOTE — Plan of Care (Signed)
Problem: Safety: Goal: Ability to remain free from injury will improve Outcome: Progressing Patient has remained free from injury during this shift.   

## 2016-08-19 NOTE — [facility] Group Notes (Signed)
[facility] Group Notes:  (Nursing/MHT/Case Management/Adjunct)  Date:  08/19/2016  Time:  4:05 PM  Type of Therapy:  Psychoeducational Skills  Participation Level:  Active  Participation Quality:  Appropriate  Affect:  Appropriate  Cognitive:  Appropriate  Insight:  Appropriate  Engagement in Group:  Engaged  Modes of Intervention:  Activity and Education  Summary of Progress/Problems:  Leonard Davidson 08/19/2016, 4:05 PM

## 2016-08-20 MED ORDER — RISPERIDONE 2 MG PO TABS: 2.0000 mg | ORAL_TABLET | Freq: Two times a day (BID) | ORAL | 0 refills | Status: DC

## 2016-08-20 MED ORDER — DIPHENHYDRAMINE HCL 50 MG PO CAPS: 50.0000 mg | ORAL_CAPSULE | Freq: Every day | ORAL | 0 refills | Status: DC

## 2016-08-20 NOTE — Discharge Summary (Addendum)
Physician Discharge Summary Note  Patient:  Leonard Davidson is an 20 y.o., male MRN:  [id number] DOB:  Oct 26, 1996 Patient phone:  [phone] (home)  Patient address:   [address] [address],  Total Time spent with patient: 30 minutes  Date of Admission:  08/15/2016 Date of Discharge: 08/20/16  Reason for Admission:  Mania and psychosis  Principal Problem: Bipolar disorder, current episode manic severe with psychotic features [facility]) Discharge Diagnoses: Patient Active Problem List   Diagnosis Date Noted  . Cannabis use disorder, severe, dependence ([place]) [F12.20] 08/16/2016  . Bipolar disorder, current episode manic severe with psychotic features ([place]) [F31.2] 08/16/2016   History of Present Illness:   The patient is a 20 year old single African-American male with no prior psychiatric history who presented under petition to our emergency department on October 11.  The petition states "locked himself in the house -not knowing his mother -not sleeping or eating- up all night- thinks people watching or talking about him".  The petitioner was Valda Lamb patient's mother phone number [phone]  At arrival in the emergency department the patient was refusing to answer questions, he was a staring off into space and was not following commands.  Head CT was negative. Urine toxicology was positive for cannabis and benzos, alcohol was below detection limit.  Per collateral information obtained in the emergency department his family reports that patient has been showing bizarre behavior for about 4 months.  He is not leaving the house he is not communicative with others he is rarely sleeping and his stays up all night. Mother reports that the patient never really has developed normally since graduating high school. He only works about a day a week and mostly stays home watching video games. She does not know whether or what his substance abuse pattern might be.  Evidently family has tried to get him to see a mental health professional but he has refused up until now.  Since admission and the nursing staff reports the patient has been hypersexual making frequently inappropriate comments towards male staff while robbing his nipples.  During assessment to day he was is feeling agitated labile and he threatened to "break out" from the unit. He said that if he was not going to be discharged today things were going to get ugly "you don't want to se me mad".  Patient is intrusive, displays psychomotor agitation, is hypersexual. Family is reporting decreased need for sleep.  He also is displaying paranoia and thinks that people here in the unit and outside were trying to harm him.  Patient denies having any auditory or visual hallucinations. Denies suicidality homicidality or problems with mood, appetite, energy is sleep or concentration. He says that he has been just sleeping fine at home and says that all of the things reported by his family are lies.  As far as substance abuse the patient said that he has been smoking marijuana since age 57. He smokes daily. He denies the use of any other illicit substances, recent prescription medications or abusing alcohol. He also denies smoking cigarettes.  Trauma was not assessed as patient was easily agitated.  Associated Signs/Symptoms: Depression Symptoms:  denies (Hypo) Manic Symptoms:  Delusions, Elevated Mood, Impulsivity, Labiality of Mood, Sexually Inapproprite Behavior, Anxiety Symptoms:  denies Psychotic Symptoms:  Paranoia, PTSD Symptoms: NA Total Time spent with patient: 1 hour  Past Psychiatric History: No prior history of psychiatric hospitalizations, no history of self-injurious behaviors or suicidal attempts. No prior history or any  type of psychiatric illnesses  Past Medical History: History reviewed. No pertinent past medical history. History reviewed. No pertinent surgical history.    Family History: History reviewed. No pertinent family history.   Family Psychiatric  History: Please that his grandfather had issues with depression and anxiety  Tobacco Screening: Have you used any form of tobacco in the last 30 days? (Cigarettes, Smokeless Tobacco, Cigars, and/or Pipes): No   Social History: Single, never married, doesn't have any children. Lives with his mother and sometimes with his grandparents. He works doing dishes one day a week. History  Alcohol Use No     History  Drug Use  . Types: Marijuana    Social History   Social History  . Marital status: Single    Spouse name: N/A  . Number of children: N/A  . Years of education: N/A   Social History Main Topics  . Smoking status: Never Smoker  . Smokeless tobacco: Never Used  . Alcohol use No  . Drug use:     Types: Marijuana  . Sexual activity: Not Asked   Other Topics Concern  . None   Social History Narrative  . None    Hospital Course:  Patient has been having bizarre behavior for the last 4 months. During initial  assessment the patient was clearly displaying signs and symptoms of mania. He also appeared to have some paranoia saying that people outside the unit and her were trying to harm him  Bipolar disorder type I current episode manic with psychotic features: This patient has been started on Risperdal 3 mg by mouth twice a day. Patient has never been treated with any antipsychotics or mood stabilizers. Patient seems to be responding well to Risperdal.  On discharge the schedule of his blood was changed to 2 mg in the morning and 4 mg at bedtime to decrease daytime sedation.  Agitation: He was demanding discharge on the day of admission to our unit and he was agitated however the patient never require seclusion, restraints or forced medications.  Insomnia: He received Ativan 2 mg at bedtime but will not be discharged on this medication  EPS: He will be discharged on Benadryl 50 mg by  mouth daily at bedtime to prevent EPS  Cannabis use disorder severe: Patient in need of substance abuse treatment upon discharge   Today the patient appears much improved. He is calm, pleasant and cooperative. He no longer displays hypersexual behavior, decreased need for sleep or psychomotor agitation.  He can be intrusive at times but is easily redirectable. During conversation today he was very appropriate and ask questions when we discussed discharge plans. He was educated about his diagnosis and the concerns with the use of cannabis and how cannabis can worsen psychosis and mood.  Patient is denying side effects from medications or having any physical complaints. He denies depressed mood problems with appetite, energy, sleep or concentration. He denies auditory or visual hallucinations there is no evidence of paranoia or delusional thinking.  Patient has been attending groups and has not been disruptive.  Contact was made with the patient's mother prior to discharge. She does not have any concerns about his safety. She received education about the medication the treatment plan and the diagnosis.  Patient does not have any access to guns.  Physical Findings: AIMS:  , ,  ,  ,    CIWA:    COWS:     Musculoskeletal: Strength & Muscle Tone: within normal limits Gait & Station:  normal Patient leans: N/A  Psychiatric Specialty Exam: Physical Exam  Constitutional: He is oriented to person, place, and time. He appears well-developed and well-nourished.  HENT:  Head: Normocephalic and atraumatic.  Eyes: Conjunctivae and EOM are normal.  Neck: Normal range of motion.  Respiratory: Effort normal.  Musculoskeletal: Normal range of motion.  Neurological: He is alert and oriented to person, place, and time.    Review of Systems  Constitutional: Negative.   HENT: Negative.   Eyes: Negative.   Cardiovascular: Negative.   Gastrointestinal: Negative.   Genitourinary: Negative.    Musculoskeletal: Negative.   Skin: Negative.   Neurological: Negative.   Endo/Heme/Allergies: Negative.   Psychiatric/Behavioral: Positive for substance abuse. Negative for depression, hallucinations, memory loss and suicidal ideas. The patient is not nervous/anxious and does not have insomnia.     Blood pressure 113/63, pulse 69, temperature 97.5 F (36.4 C), resp. rate 18, height _0  (1.88 m), weight 63.5 kg (140 lb), SpO2 90 %.Body mass index is 17.97 kg/m.  General Appearance: Well Groomed  Eye Contact:  Good  Speech:  Clear and Coherent  Volume:  Normal  Mood:  Euthymic  Affect:  Appropriate  Thought Process:  Linear and Descriptions of Associations: Intact  Orientation:  Full (Time, Place, and Person)  Thought Content:  Hallucinations: None  Suicidal Thoughts:  No  Homicidal Thoughts:  No  Memory:  Immediate;   Good Recent;   Good Remote;   Good  Judgement:  Poor  Insight:  Shallow  Psychomotor Activity:  Normal  Concentration:  Concentration: Fair and Attention Span: Fair  Recall:  Good  Fund of Knowledge:  Fair  Language:  NA  Akathisia:  No  Handed:    AIMS (if indicated):     Assets:  Agricultural consultant Physical Health Social Support  ADL's:  Intact  Cognition:  WNL  Sleep:  Number of Hours: 8.25     Have you used any form of tobacco in the last 30 days? (Cigarettes, Smokeless Tobacco, Cigars, and/or Pipes): No  Has this patient used any form of tobacco in the last 30 days? (Cigarettes, Smokeless Tobacco, Cigars, and/or Pipes) Yes, No  Blood Alcohol level:  Lab Results  Component Value Date   ETH <5 90/30/0923    Metabolic Disorder Labs:  Lab Results  Component Value Date   HGBA1C 5.3 08/17/2016   MPG 105 08/17/2016   No results found for: PROLACTIN Lab Results  Component Value Date   CHOL 119 08/17/2016   TRIG 55 08/17/2016   HDL 32 (L) 08/17/2016   CHOLHDL 3.7 08/17/2016   VLDL 11 08/17/2016   LDLCALC 76  08/17/2016   Results for ZACHARIAS, RIDLING (MRN [id number]) as of 08/20/2016 11:59  Ref. Range 08/14/2016 13:58 08/14/2016 20:43 08/15/2016 07:16 08/16/2016 00:29 08/17/2016 06:56  Sodium Latest Ref Range: 135 - 145 mmol/L 139      Potassium Latest Ref Range: 3.5 - 5.1 mmol/L 3.4 (L)      Chloride Latest Ref Range: 101 - 111 mmol/L 107      CO2 Latest Ref Range: 22 - 32 mmol/L 24      Mean Plasma Glucose Latest Units: mg/dL     105  BUN Latest Ref Range: 6 - 20 mg/dL 16      Creatinine Latest Ref Range: 0.61 - 1.24 mg/dL 1.14      Calcium Latest Ref Range: 8.9 - 10.3 mg/dL 9.6      EGFR (Non-African Amer.) Latest Ref  Range: >60 mL/min >60      EGFR (African American) Latest Ref Range: >60 mL/min >60      Glucose Latest Ref Range: 65 - 99 mg/dL 130 (H)      Anion gap Latest Ref Range: 5 - 15  8      Alkaline Phosphatase Latest Ref Range: 38 - 126 U/L 99      Albumin Latest Ref Range: 3.5 - 5.0 g/dL 5.2 (H)      AST Latest Ref Range: 15 - 41 U/L 19      ALT Latest Ref Range: 17 - 63 U/L 12 (L)      Total Protein Latest Ref Range: 6.5 - 8.1 g/dL 8.2 (H)      Total Bilirubin Latest Ref Range: 0.3 - 1.2 mg/dL 1.1      Cholesterol Latest Ref Range: 0 - 200 mg/dL     119  Triglycerides Latest Ref Range: <150 mg/dL     55  HDL Cholesterol Latest Ref Range: >40 mg/dL     32 (L)  LDL (calc) Latest Ref Range: 0 - 99 mg/dL     76  VLDL Latest Ref Range: 0 - 40 mg/dL     11  Total CHOL/HDL Ratio Latest Units: RATIO     3.7  WBC Latest Ref Range: 3.8 - 10.6 K/uL 5.2      RBC Latest Ref Range: 4.40 - 5.90 MIL/uL 5.09      Hemoglobin Latest Ref Range: 13.0 - 18.0 g/dL 15.7      HCT Latest Ref Range: 40.0 - 52.0 % 44.6      MCV Latest Ref Range: 80.0 - 100.0 fL 87.7      MCH Latest Ref Range: 26.0 - 34.0 pg 30.8      MCHC Latest Ref Range: 32.0 - 36.0 g/dL 35.1      RDW Latest Ref Range: 11.5 - 14.5 % 13.0      Platelets Latest Ref Range: 150 - 440 K/uL 210      Acetaminophen (Tylenol), S  Latest Ref Range: 10 - 30 ug/mL <34 (L)      Salicylate Lvl Latest Ref Range: 2.8 - 30.0 mg/dL <7.0      Hemoglobin A1C Latest Ref Range: 4.8 - 5.6 %     5.3  TSH Latest Ref Range: 0.350 - 4.500 uIU/mL     0.735  Appearance Latest Ref Range: CLEAR    CLEAR (A)    Bacteria, UA Latest Ref Range: NONE SEEN    NONE SEEN    Bilirubin Urine Latest Ref Range: NEGATIVE    NEGATIVE    Color, Urine Latest Ref Range: YELLOW    YELLOW (A)    Glucose Latest Ref Range: NEGATIVE mg/dL   NEGATIVE    Hgb urine dipstick Latest Ref Range: NEGATIVE    NEGATIVE    Ketones, ur Latest Ref Range: NEGATIVE mg/dL   NEGATIVE    Leukocytes, UA Latest Ref Range: NEGATIVE    NEGATIVE    Mucous Unknown   PRESENT    Nitrite Latest Ref Range: NEGATIVE    NEGATIVE    pH Latest Ref Range: 5.0 - 8.0    5.0    Protein Latest Ref Range: NEGATIVE mg/dL   30 (A)    RBC / HPF Latest Ref Range: 0 - 5 RBC/hpf   0-5    Specific Gravity, Urine Latest Ref Range: 1.005 - 1.030    1.030    Squamous Epithelial /  LPF Latest Ref Range: NONE SEEN    0-5 (A)    WBC, UA Latest Ref Range: 0 - 5 WBC/hpf   0-5    Alcohol, Ethyl (B) Latest Ref Range: <5 mg/dL <5      Amphetamines, Ur Screen Latest Ref Range: NONE DETECTED    NONE DETECTED    Barbiturates, Ur Screen Latest Ref Range: NONE DETECTED    NONE DETECTED    Benzodiazepine, Ur Scrn Latest Ref Range: NONE DETECTED    POSITIVE (A)    Cocaine Metabolite,Ur [place] Latest Ref Range: NONE DETECTED    NONE DETECTED    Methadone Scn, Ur Latest Ref Range: NONE DETECTED    NONE DETECTED    MDMA (Ecstasy)Ur Screen Latest Ref Range: NONE DETECTED    NONE DETECTED    Cannabinoid 50 Ng, Ur [place] Latest Ref Range: NONE DETECTED    POSITIVE (A)    Opiate, Ur Screen Latest Ref Range: NONE DETECTED    NONE DETECTED    Phencyclidine (PCP) Ur S Latest Ref Range: NONE DETECTED    NONE DETECTED    Tricyclic, Ur Screen Latest Ref Range: NONE DETECTED    NONE DETECTED      CLINICAL DATA:  Paranoia  EXAM: CT  HEAD WITHOUT CONTRAST  TECHNIQUE: Contiguous axial images were obtained from the base of the skull through the vertex without intravenous contrast.  COMPARISON:  None.  FINDINGS: Brain: No evidence of acute infarction, hemorrhage, hydrocephalus, extra-axial collection or mass lesion/mass effect.  Vascular: No hyperdense vessel or unexpected calcification.  Skull: Normal. Negative for fracture or focal lesion.  Sinuses/Orbits: No acute finding.  Other: None.  IMPRESSION: No acute abnormality noted.   See Psychiatric Specialty Exam and Suicide Risk Assessment completed by Attending Physician prior to discharge.  Discharge destination:  Home  Is patient on multiple antipsychotic therapies at discharge:  No   Has Patient had three or more failed trials of antipsychotic monotherapy by history:  No  Recommended Plan for Multiple Antipsychotic Therapies: NA     Medication List    TAKE these medications     Indication  diphenhydrAMINE 50 MG capsule Commonly known as:  BENADRYL Take 1 capsule (50 mg total) by mouth at bedtime.  Indication:  Extrapyramidal Reaction, insomnia   risperiDONE 2 MG tablet Commonly known as:  RISPERDAL Take 1 tablet (2 mg total) by mouth 2 (two) times daily. Please take 1 tab in am and 2 tabs at bedtime  Indication:  bipolar      Follow-up Information    [facility]. Go on 08/26/2016.   Why:  11:00 for counseling with [facility].  If you are unable to keep this appointment please call to reschedule as soon as possible. Contact information: [address] [place], [place] [address] [phone] [place]. Go on 09/04/2016.   Why:  Arrive at 10:30am for your 11:00 am appointment for Medication Management with Dr. Einar Grad.  Please Call to reschedule as soon as possible if you are unable to keep this appointment. Contact information: [place], [place] [address] [phone] FAX         >30 minutes. >50 % of the time was spent in coordination of care.  Signed: Hildred Priest, MD 08/20/2016, 12:08 PM

## 2016-08-20 NOTE — [facility] Suicide Risk Assessment (Signed)
[facility]   Principal Problem: Bipolar disorder, current episode manic severe with psychotic features [facility](HCC) Discharge Diagnoses:  Patient Active Problem List   Diagnosis Date Noted  . Cannabis use disorder, severe, dependence ([facility]) [F12.20] 08/16/2016  . Bipolar disorder, current episode manic severe with psychotic features ([facility]) [F31.2] 08/16/2016      Psychiatric Specialty Exam: ROS  Blood pressure 113/63, pulse 69, temperature 97.5 F (36.4 C), resp. rate 18, height 6\' 2"  (1.88 m), weight 63.5 kg (140 lb), SpO2 90 %.Body mass index is 17.97 kg/m.                                                       Mental Status Per Nursing Assessment::   On Admission:  NA (Pt denies)  Demographic Factors:  Male and Adolescent or young adult  Loss Factors: Decrease in vocational status  Historical Factors: Impulsivity  Risk Reduction Factors:   Sense of responsibility to family, Employed, Living with another person, especially a relative and Positive social support  Continued Clinical Symptoms:  Alcohol/Substance Abuse/Dependencies  Cognitive Features That Contribute To Risk:  Closed-mindedness    Suicide Risk:  Minimal: No identifiable suicidal ideation.  Patients presenting with no risk factors but with morbid ruminations; may be classified as minimal risk based on the severity of the depressive symptoms    Jimmy FootmanHernandez-Gonzalez,  Gray Doering, MD 08/20/2016, 9:26 AM

## 2016-08-20 NOTE — Progress Notes (Signed)
Pt discharged home. DC instructions provided and explained. Medications reviewed. Rx given. All questions answered. Denies SI, HI, AVH. No psychosis noted. Belongings returned. Pt stable at discharge.

## 2016-08-20 NOTE — [facility] Suicide Risk Assessment (Signed)
[facility] INPATIENT:  Family/Significant Other Suicide Prevention Education  Suicide Prevention Education:  Contact Attempts: Damaris HippoRobin Walker, Mother [phone], (name of family member/significant other) has been identified by the patient as the family member/significant other with whom the patient will be residing, and identified as the person(s) who will aid the patient in the event of a mental health crisis.  With written consent from the patient, two attempts were made to provide suicide prevention education, prior to and/or following the patient's discharge.  We were unsuccessful in providing suicide prevention education.  A suicide education pamphlet was given to the patient to share with family/significant other.  Date and time of first attempt:08/20/16/12:00pm Date and time of second attempt10/17/17:/3:00pm  Left Voicemail to call if any questions regarding discharge.  Glennon MacSara P Renetta Suman, MSW, LCSW 08/20/2016, 5:57 PM

## 2016-08-20 NOTE — Progress Notes (Signed)
Recreation Therapy Notes  Date: 10.17.17 Time: 9:30 am Location: Craft Room  Group Topic: Coping Skills  Goal Area(s) Addresses:  Patient will identify things they are grateful for. Patient will identify how being grateful can influence decision making.  Behavioral Response: Attentive, Interactive  Intervention: Grateful Wheel  Activity: Patients were given an I Am Grateful For worksheet and instructed to write 2-3 things they are grateful for under each category.  Education: LRT educated patients on why it is important to be grateful.  Education Outcome: Acknowledges education/In group clarification offered  Clinical Observations/Feedback: Patient completed activity by writing things he was grateful for. Patient contributed to group discussion by stating things he was grateful for and how being aware of what he is grateful for can guide his decision making.  Jacquelynn CreeGreene,Pietra Zuluaga M, LRT/CTRS 08/20/2016 10:22 AM

## 2016-08-20 NOTE — [facility] Counselor (Signed)
Adult Comprehensive Assessment  Patient ID: Leonard Davidson, male   DOB: Apr 20, 1996, 20 y.o.   MRN: [id number]  Information Source: Information source: Patient  Current Stressors:  Surveyor, quantityinancial / Lack of resources (include bankruptcy): limited income, working one day a week Social relationships: limited social interaction Substance abuse: smoking marajuana daily  Living/Environment/Situation:  Living Arrangements: Parent, Other relatives Living conditions (as described by patient or guardian): good, How long has patient lived in current situation?: since a child What is atmosphere in current home: Comfortable, ParamedicLoving, Supportive  Family History:  Marital status: Single Are you sexually active?: Yes What is your sexual orientation?: heterosexual Has your sexual activity been affected by drugs, alcohol, medication, or emotional stress?: Hyper-sexual on admission due to mania Does patient have children?: No  Childhood History:  By whom was/is the patient raised?: Both parents Description of patient's relationship with caregiver when they were a child: good relationships Patient's description of current relationship with people who raised him/her: good Did patient suffer any verbal/emotional/physical/sexual abuse as a child?: No Did patient suffer from severe childhood neglect?: No Has patient ever been sexually abused/assaulted/raped as an adolescent or adult?: No Was the patient ever a victim of a crime or a disaster?: No Witnessed domestic violence?: No Has patient been effected by domestic violence as an adult?: No  Education:  Highest grade of school patient has completed: 12 Currently a student?: No Name of school: n/a Learning disability?: No  Employment/Work Situation:   Employment situation: Employed (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) Has patient ever been in the Eli Lilly and Companymilitary?: No Has patient ever served in Buyer, retailcombat?: No Did You Receive Any Psychiatric  Treatment/Services While in Equities traderthe Military?: No Are There Guns or Other Weapons in Your Home?: No Are These Weapons Safely Secured?: No  Financial Resources:   Financial resources: Income from employment, Support from parents / caregiver Does patient have a representative payee or guardian?: No  Alcohol/Substance Abuse:   What has been your use of drugs/alcohol within the last 12 months?: marajuana daily since age 20 If attempted suicide, did drugs/alcohol play a role in this?: No Alcohol/Substance Abuse Treatment Hx: Denies past history Has alcohol/substance abuse ever caused legal problems?: No  Social Support System:   Conservation officer, natureatient's Community Support System: Fair Museum/gallery exhibitions officerDescribe Community Support System: Parents  Leisure/Recreation:   Leisure and Hobbies: video games, watching TV  Strengths/Needs:   What things does the patient do well?: video games In what areas does patient struggle / problems for patient: socializing  Discharge Plan:   Does patient have access to transportation?: Yes Will patient be returning to same living situation after discharge?: Yes Currently receiving community mental health services: No If no, would patient like referral for services when discharged?: Yes (What county?) (Referral made to [facility] in [place]) Does patient have financial barriers related to discharge medications?: No  Summary/Recommendations:   Summary and Recommendations (to be completed by the evaluator): Pt is 20 year old male with no history of psychiatric admissions. He comes to ER on petition from his family who have become with concerned with increasingly paranoid and isolative behavior over the past month. While on the unit Pt will have the opportunity to participate in groups and therapeutic milieu. He will have medications managed and assistance with approriate discharge planning.  Reccomendations include follow up with outpatient Medication management and counseling at  discharge.  Cleda DaubSara P Siera [place]dorf.MSW, LCSW 08/20/2016

## 2016-08-21 NOTE — Progress Notes (Signed)
  [facility] Dis[facility] : Late entry for CSW Laws  Will you be returning to the same living situation after discharge:  Yes,  w parents At discharge, do you have transportation home?: Yes,  family Do you have the ability to pay for your medications: Yes,  no concerns expressed  Release of information consent forms completed and in the chart;  Patient's signature needed at discharge.  Patient to Follow up at: Follow-up Information    [facility]. Go on 08/26/2016.   Why:  11:00 for counseling with Nolon RodNicole Peacock.  If you are unable to keep this appointment please call to reschedule as soon as possible. Contact information: [address] [place], [place] [address] [phone] [facility]       [facility]. Go on 09/04/2016.   Why:  Arrive at 10:30am for your 11:00 am appointment for Medication Management with Dr. Daleen Boavi.  Please Call to reschedule as soon as possible if you are unable to keep this appointment. Contact information: [address] [place], [place] [address] [phone] FAX          Next level of care provider [facility] Link:yes  Safety Planning and Suicide Prevention discussed: Yes,  w mother  Have you used any form of tobacco in the last 30 days? (Cigarettes, Smokeless Tobacco, Cigars, and/or Pipes): No  Has patient been referred to the Quitline?: N/A patient is not a smoker  Patient has been referred for addiction treatment: Yes  Sallee Langenne C Cunningham 08/21/2016, 5:40 PM

## 2016-08-26 ENCOUNTER — Ambulatory Visit (INDEPENDENT_AMBULATORY_CARE_PROVIDER_SITE_OTHER): Payer: 59 | Admitting: Licensed Clinical Social Worker

## 2016-08-26 DIAGNOSIS — F312 Bipolar disorder, current episode manic severe with psychotic features: Secondary | ICD-10-CM

## 2016-08-27 NOTE — Progress Notes (Signed)
Comprehensive Clinical Assessment (CCA) Note  08/26/2016 Leonard Davidson [id number]  Visit Diagnosis:      ICD-9-CM ICD-10-CM   1. Bipolar I disorder, severe, current or most recent episode manic, with psychotic features (HCC) 296.44 F31.2       CCA Part One  Part One has been completed on paper by the patient.  (See scanned document in Chart Review)  CCA Part Two A  Intake/Chief Complaint:  CCA Intake With Chief Complaint CCA Part Two Date: 08/26/16 CCA Part Two Time: 1100 Chief Complaint/Presenting Problem: "I don't think I need to be here; everyone else does." Patients Currently Reported Symptoms/Problems: Leonard Davidson was accompanied by his mother and paternal Grandparents.  Leonard Davidson is following up after being involuntarily committed in the hospital about a week or so ago.  Leonard Davidson states that he was having a "bad day" but his family reports that his "weird" behavior began several months ago.  Mother states that things changed around his Senior year in high school.  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  He reports that he smokes "weed" daily with and without others.  He reports that his friends are not "real" so he does not spend time with them.  Mother reports that he is afraid of the dark and reports that he sees spirits and ghost.   Collateral Involvement: Mother primary reporter.  Paternal Grandparent secondary reporters Individual's Strengths: playing video game and being "real' Individual's Preferences: to be left alone playing his games Individual's Abilities: comprehends Type of Services Patient Feels Are Needed: none  Mental Health Symptoms Depression:  Depression: N/A  Mania:  Mania: Increased Energy, Irritability, Racing thoughts, Recklessness  Anxiety:   Anxiety: N/A  Psychosis:  Psychosis: Hallucinations  Trauma:  Trauma: N/A  Obsessions:  Obsessions: N/A  Compulsions:  Compulsions: N/A  Inattention:   Inattention: N/A  Hyperactivity/Impulsivity:  Hyperactivity/Impulsivity: N/A  Oppositional/Defiant Behaviors:  Oppositional/Defiant Behaviors: N/A  Borderline Personality:  Emotional Irregularity: Chronic feelings of emptiness, Intense/inappropriate anger, Intense/unstable relationships  Other Mood/Personality Symptoms:      Mental Status Exam Appearance and self-care  Stature:  Stature: Tall  Weight:  Weight: Thin  Clothing:  Clothing: Casual  Grooming:  Grooming: Normal  Cosmetic use:  Cosmetic Use: None  Posture/gait:  Posture/Gait: Normal  Motor activity:  Motor Activity: Not Remarkable  Sensorium  Attention:  Attention: Normal  Concentration:  Concentration: Normal  Orientation:  Orientation: X5  Recall/memory:  Recall/Memory: Normal  Affect and Mood  Affect:  Affect: Flat  Mood:     Relating  Eye contact:  Eye Contact: Normal  Facial expression:  Facial Expression: Constricted  Attitude toward examiner:  Attitude Toward Examiner: Cooperative  Thought and Language  Speech flow: Speech Flow: Normal  Thought content:  Thought Content: Appropriate to mood and circumstances  Preoccupation:     Hallucinations:  Hallucinations: Visual  Organization:     Company secretaryxecutive Functions  Fund of Knowledge:  Fund of Knowledge: Average  Intelligence:  Intelligence: Average  Abstraction:     Judgement:  Judgement: Fair  Dance movement psychotherapisteality Testing:  Reality Testing: Adequate  Insight:  Insight: Fair  Decision Making:  Decision Making: Vacilates  Social Functioning  Social Maturity:  Social Maturity: Isolates  Social Judgement:  Social Judgement: Naive  Stress  Stressors:  Stressors: Family conflict, Grief/losses, Transitions, Arts administratorMoney, Work  Coping Ability:  Coping Ability: Building surveyorverwhelmed  Skill Deficits:     Supports:  Family and Psychosocial History: Family history Marital status: Single Are you sexually active?: No What is your sexual orientation?: heterosexua Has your sexual activity been  affected by drugs, alcohol, medication, or emotional stress?: denies Does patient have children?: No  Childhood History:  Childhood History By whom was/is the patient raised?: Mother Additional childhood history information: Parents divorced when he was a small child Description of patient's relationship with caregiver when they were a child: Mother: good relationship, supportive, loving Father: minimal; biweekly visitation on weekends Patient's description of current relationship with people who raised him/her: Mother: supportive, great relationship Father: does not exist How were you disciplined when you got in trouble as a child/adolescent?: punished Does patient have siblings?: Yes (Jalen 24) Description of patient's current relationship with siblings: attempts to be an Authority figure Did patient suffer any verbal/emotional/physical/sexual abuse as a child?: No Did patient suffer from severe childhood neglect?: No Has patient ever been sexually abused/assaulted/raped as an adolescent or adult?: No Was the patient ever a victim of a crime or a disaster?: No Witnessed domestic violence?: No Has patient been effected by domestic violence as an adult?: No  CCA Part Two B  Employment/Work Situation: Employment / Work Psychologist, occupational Employment situation: Employed 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 How long has patient been employed?: few weeks Patient's job has been impacted by current illness: No What is the longest time patient has a held a job?: few months 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 Has patient ever been in the [employer]?: No  Education: Education Name of [employer]: [employer] Did Garment/textile technologist From [employer]?: Yes Did Theme park manager?: No Did Designer, television/film set?: No Did You Have Any Scientist, research (life sciences) In School?: basketball Did You Have An Individualized Education Program (IIEP): No Did You Have Any Difficulty  At [employer]?: No  Religion: Religion/Spirituality Are You A Religious Person?: No  Leisure/Recreation: Leisure / Recreation Leisure and Hobbies: video games, basketball  Exercise/Diet: Exercise/Diet Do You Exercise?: Yes What Type of Exercise Do You Do?: Other (Comment) (playing basketball) How Many Times a Week Do You Exercise?: Daily Have You Gained or Lost A Significant Amount of Weight in the Past Six Months?: No Do You Follow a Special Diet?: No Do You Have Any Trouble Sleeping?:  (has vivid dreams)  CCA Part Two C  Alcohol/Drug Use: Alcohol / Drug Use Pain Medications: denies Prescriptions: Risperidone Over the Counter: denies History of alcohol / drug use?: Yes Substance #1 Name of Substance 1: Cannabis 1 - Age of First Use: 16 1 - Amount (size/oz): 1 blunt daily about $5-$10 1 - Frequency: daily 1 - Duration: about a year 1 - Last Use / Amount: last week                    CCA Part Three  ASAM's:  Six Dimensions of Multidimensional Assessment  Dimension 1:  Acute Intoxication and/or Withdrawal Potential:     Dimension 2:  Biomedical Conditions and Complications:     Dimension 3:  Emotional, Behavioral, or Cognitive Conditions and Complications:     Dimension 4:  Readiness to Change:     Dimension 5:  Relapse, Continued use, or Continued Problem Potential:     Dimension 6:  Recovery/Living Environment:      Substance use Disorder (SUD)    Social Function:  Social Functioning Social Maturity: Isolates Social Judgement: Naive  Stress:  Stress Stressors: Family conflict, Grief/losses, Transitions, Arts administrator, Work  Coping Ability: Overwhelmed Patient Takes Medications The Way The Doctor Instructed?: Yes Priority Risk: High Risk  Risk Assessment- Self-Harm Potential: Risk Assessment For Self-Harm Potential Thoughts of Self-Harm: No current thoughts Method: No plan Availability of Means: No access/NA  Risk Assessment -Dangerous to Others  Potential: Risk Assessment For Dangerous to Others Potential Method: No Plan Availability of Means: No access or NA Intent: Vague intent or NA Notification Required: No need or identified person  DSM5 Diagnoses: Patient Active Problem List   Diagnosis Date Noted  . Cannabis use disorder, severe, dependence (HCC) 08/16/2016  . Bipolar disorder, current episode manic severe with psychotic features (HCC) 08/16/2016    Patient Centered Plan: Will complete at next visit  Recommendations for Services/Supports/Treatments: Recommendations for Services/Supports/Treatments Recommendations For Services/Supports/Treatments: Individual Therapy, Medication Management  F/u w/ at least biweekly counseling.      Treatment Plan Summary:    Referrals to Alternative Service(s): Referred to Alternative Service(s):   Place:   Date:   Time:    Referred to Alternative Service(s):   Place:   Date:   Time:    Referred to Alternative Service(s):   Place:   Date:   Time:    Referred to Alternative Service(s):   Place:   Date:   Time:     Marinda Elk

## 2016-09-02 ENCOUNTER — Emergency Department
Admission: EM | Admit: 2016-09-02 | Discharge: 2016-09-02 | Disposition: A | Discharge status: Other Acute Inpatient Hospital | Payer: 59 | Attending: Emergency Medicine | Admitting: Emergency Medicine

## 2016-09-02 ENCOUNTER — Inpatient Hospital Stay
Admission: RE | Admit: 2016-09-02 | Discharge: 2016-09-09 | DRG: 885 | Disposition: A | Discharge status: Home / Self Care | Payer: 59 | Source: Intra-hospital | Attending: Psychiatry | Admitting: Psychiatry

## 2016-09-02 DIAGNOSIS — Y9302 Activity, running: Secondary | ICD-10-CM | POA: Insufficient documentation

## 2016-09-02 DIAGNOSIS — R45851 Suicidal ideations: Secondary | ICD-10-CM | POA: Diagnosis present

## 2016-09-02 DIAGNOSIS — W228XXA Striking against or struck by other objects, initial encounter: Secondary | ICD-10-CM | POA: Diagnosis not present

## 2016-09-02 DIAGNOSIS — S01112A Laceration without foreign body of left eyelid and periocular area, initial encounter: Secondary | ICD-10-CM | POA: Insufficient documentation

## 2016-09-02 DIAGNOSIS — Z79899 Other long term (current) drug therapy: Secondary | ICD-10-CM | POA: Diagnosis not present

## 2016-09-02 DIAGNOSIS — Z23 Encounter for immunization: Secondary | ICD-10-CM | POA: Insufficient documentation

## 2016-09-02 DIAGNOSIS — F312 Bipolar disorder, current episode manic severe with psychotic features: Secondary | ICD-10-CM

## 2016-09-02 DIAGNOSIS — F319 Bipolar disorder, unspecified: Secondary | ICD-10-CM | POA: Diagnosis not present

## 2016-09-02 DIAGNOSIS — Z9119 Patient's noncompliance with other medical treatment and regimen: Secondary | ICD-10-CM

## 2016-09-02 DIAGNOSIS — Z91199 Patient's noncompliance with other medical treatment and regimen due to unspecified reason: Secondary | ICD-10-CM

## 2016-09-02 DIAGNOSIS — F1220 Cannabis dependence, uncomplicated: Secondary | ICD-10-CM | POA: Diagnosis present

## 2016-09-02 DIAGNOSIS — G4700 Insomnia, unspecified: Secondary | ICD-10-CM | POA: Diagnosis present

## 2016-09-02 DIAGNOSIS — Z9114 Patient's other noncompliance with medication regimen: Secondary | ICD-10-CM | POA: Diagnosis not present

## 2016-09-02 DIAGNOSIS — R45850 Homicidal ideations: Secondary | ICD-10-CM | POA: Diagnosis present

## 2016-09-02 DIAGNOSIS — F419 Anxiety disorder, unspecified: Secondary | ICD-10-CM | POA: Diagnosis present

## 2016-09-02 DIAGNOSIS — Z818 Family history of other mental and behavioral disorders: Secondary | ICD-10-CM

## 2016-09-02 DIAGNOSIS — Y999 Unspecified external cause status: Secondary | ICD-10-CM | POA: Insufficient documentation

## 2016-09-02 DIAGNOSIS — Y929 Unspecified place or not applicable: Secondary | ICD-10-CM | POA: Diagnosis not present

## 2016-09-02 HISTORY — DX: Bipolar disorder, unspecified: F31.9

## 2016-09-02 LAB — COMPREHENSIVE METABOLIC PANEL
ALK PHOS: 67 U/L (ref 38–126)
ALT: 292 U/L — AB (ref 17–63)
AST: 139 U/L — ABNORMAL HIGH (ref 15–41)
Albumin: 5 g/dL (ref 3.5–5.0)
Anion gap: 10 (ref 5–15)
BUN: 18 mg/dL (ref 6–20)
CALCIUM: 9.5 mg/dL (ref 8.9–10.3)
CO2: 23 mmol/L (ref 22–32)
CREATININE: 0.94 mg/dL (ref 0.61–1.24)
Chloride: 100 mmol/L — ABNORMAL LOW (ref 101–111)
Glucose, Bld: 101 mg/dL — ABNORMAL HIGH (ref 65–99)
Potassium: 3.7 mmol/L (ref 3.5–5.1)
Sodium: 133 mmol/L — ABNORMAL LOW (ref 135–145)
Total Bilirubin: 0.2 mg/dL — ABNORMAL LOW (ref 0.3–1.2)
Total Protein: 8.1 g/dL (ref 6.5–8.1)

## 2016-09-02 LAB — SALICYLATE LEVEL

## 2016-09-02 LAB — ETHANOL

## 2016-09-02 LAB — CBC
HCT: 40.7 % (ref 40.0–52.0)
HEMOGLOBIN: 14.2 g/dL (ref 13.0–18.0)
MCH: 30.5 pg (ref 26.0–34.0)
MCHC: 34.9 g/dL (ref 32.0–36.0)
MCV: 87.3 fL (ref 80.0–100.0)
Platelets: 176 10*3/uL (ref 150–440)
RBC: 4.66 MIL/uL (ref 4.40–5.90)
RDW: 13.3 % (ref 11.5–14.5)
WBC: 7.4 10*3/uL (ref 3.8–10.6)

## 2016-09-02 LAB — ACETAMINOPHEN LEVEL: Acetaminophen (Tylenol), Serum: <10 ug/mL — ABNORMAL LOW (ref 10–30)

## 2016-09-02 MED ORDER — RISPERIDONE 1 MG PO TABS: 2.0000 mg | ORAL_TABLET | Freq: Every day | ORAL | Status: DC

## 2016-09-02 MED ORDER — LIDOCAINE-EPINEPHRINE (PF) 1 %-1:200000 IJ SOLN
INTRAMUSCULAR | Status: AC
  Filled 2016-09-02: qty 30

## 2016-09-02 MED ORDER — ACETAMINOPHEN 325 MG PO TABS
650.0000 mg | ORAL_TABLET | Freq: Four times a day (QID) | ORAL | Status: DC | PRN
  Administered 2016-09-05 – 2016-09-08 (×4): 650 mg via ORAL
  Filled 2016-09-02 (×4): qty 2

## 2016-09-02 MED ORDER — ALUM & MAG HYDROXIDE-SIMETH 200-200-20 MG/5ML PO SUSP
30.0000 mL | ORAL | Status: DC | PRN
  Administered 2016-09-06 (×2): 30 mL via ORAL
  Filled 2016-09-02 (×2): qty 30

## 2016-09-02 MED ORDER — MAGNESIUM HYDROXIDE 400 MG/5ML PO SUSP
30.0000 mL | Freq: Every day | ORAL | Status: DC | PRN
  Administered 2016-09-06: 30 mL via ORAL
  Filled 2016-09-02: qty 30

## 2016-09-02 MED ORDER — RISPERIDONE 1 MG PO TABS
2.0000 mg | ORAL_TABLET | Freq: Two times a day (BID) | ORAL | Status: DC
  Administered 2016-09-03 – 2016-09-09 (×13): 2 mg via ORAL
  Filled 2016-09-02 (×13): qty 2

## 2016-09-02 MED ORDER — DIPHENHYDRAMINE HCL 25 MG PO CAPS
25.0000 mg | ORAL_CAPSULE | Freq: Two times a day (BID) | ORAL | Status: DC
  Administered 2016-09-03 – 2016-09-09 (×13): 25 mg via ORAL
  Filled 2016-09-02 (×14): qty 1

## 2016-09-02 MED ORDER — TRAZODONE HCL 100 MG PO TABS
100.0000 mg | ORAL_TABLET | Freq: Every evening | ORAL | Status: DC | PRN
  Administered 2016-09-03 – 2016-09-08 (×4): 100 mg via ORAL
  Filled 2016-09-02 (×4): qty 1

## 2016-09-02 MED ORDER — RISPERIDONE 1 MG PO TABS
2.0000 mg | ORAL_TABLET | Freq: Two times a day (BID) | ORAL | Status: DC
  Administered 2016-09-02: 2 mg via ORAL
  Filled 2016-09-02: qty 2

## 2016-09-02 MED ORDER — DIPHENHYDRAMINE HCL 25 MG PO CAPS
25.0000 mg | ORAL_CAPSULE | Freq: Two times a day (BID) | ORAL | Status: DC
  Administered 2016-09-02: 25 mg via ORAL
  Filled 2016-09-02: qty 1

## 2016-09-02 MED ORDER — BACITRACIN ZINC 500 UNIT/GM EX OINT
TOPICAL_OINTMENT | CUTANEOUS | Status: AC
  Administered 2016-09-02: 1
  Filled 2016-09-02: qty 0.9

## 2016-09-02 MED ORDER — RISPERIDONE 3 MG PO TABS
4.0000 mg | ORAL_TABLET | Freq: Every day | ORAL | Status: DC
  Administered 2016-09-02: 4 mg via ORAL
  Filled 2016-09-02: qty 1

## 2016-09-02 MED ORDER — TETANUS-DIPHTH-ACELL PERTUSSIS 5-2.5-18.5 LF-MCG/0.5 IM SUSP
0.5000 mL | Freq: Once | INTRAMUSCULAR | Status: AC
  Administered 2016-09-02: 0.5 mL via INTRAMUSCULAR
  Filled 2016-09-02: qty 0.5

## 2016-09-02 MED ORDER — HYDROXYZINE HCL 25 MG PO TABS
25.0000 mg | ORAL_TABLET | Freq: Three times a day (TID) | ORAL | Status: DC | PRN
  Administered 2016-09-03 – 2016-09-08 (×9): 25 mg via ORAL
  Filled 2016-09-02 (×9): qty 1

## 2016-09-02 NOTE — ED Notes (Signed)
Multiple employees were on the quad looking for safety issues and this pt used this interference as a tactic to run  He ran as fast and strong as he could through the EMS doors - busting both of them as he went through  Pt received a lac to his left eyebrow area when he went through one of the doors  [facility]S and [employer] were able to apprehend him at the hospital entrance

## 2016-09-02 NOTE — ED Triage Notes (Addendum)
Pt comes into the ED via [employer] with c/o pt having aggressive behavior toward his grandmother today, states he got into a verbal argument and reports of him actually swinging at her. Pt is calm and cooperative on arrival. Pt admits to not taking his medications for bipolar.

## 2016-09-02 NOTE — ED Notes (Signed)
Pt denies pain, SI, HI and AVH. Pt states he does not know why he is in the hospital. "I just want to be free." Pt is calm and cooperative.   Maintained on 15 minute checks and observation by security camera for safety.

## 2016-09-02 NOTE — BH Assessment (Signed)
Tele Assessment Note   Leonard FuchsWilliam J Davidson is an 20 y.o. male, African American, single who [facility] per ED report:presented with aggressive behavior, agitation. Patient brought in by police for agitation and aggressive behavior. He has a history of bipolar and has not been taking his medicines. He was arguing with mother and threatened his grandmother. Came by IVC by police. While in the ED, he decided to leave and ran into the door. Has no LOC. Has L eyebrow laceration. Patient states primary concern is of bipolar disorder, and feelings that he has to defend his family. Patient acknowledges that he has not been med compliant w complaints of paranoia, and feelings of other out to get him. Patient states that he currently resides with grandparents and is not currently employed. Patient denies current SI/HI and AVH. Patient denies hx. Of S.A.  Patient acknowledges most [facility] for bipolar disorder. Patient does not acknowledge any outpatient psych treatment.  Patient is dressed in scrubs and is alert and oriented x4. Patient speech was within normal limits and motor behavior appeared normal. Patient thought process is coherent. Patient  does not appear to be responding to internal stimuli. Patient was cooperative throughout the assessment.    Diagnosis: Bipoar I Disorder  Past Medical History:  Past Medical History:  Diagnosis Date  . Bipolar 1 disorder (HCC)     History reviewed. No pertinent surgical history.  Family History: No family history on file.  Social History:  reports that he has never smoked. He has never used smokeless tobacco. He reports that he uses drugs, including Marijuana. He reports that he does not drink alcohol.  Additional Social History:  Alcohol / Drug Use Pain Medications: SEE MAR Prescriptions: SEE MAR Over the Counter: SEE MAR History of alcohol / drug use?: No history of alcohol / drug abuse  CIWA: CIWA-Ar BP:  131/78 Pulse Rate: 92 COWS:    PATIENT STRENGTHS: (choose at least two) Active sense of humor Average or above average intelligence  Allergies: No Known Allergies  Home Medications:  (Not in a hospital admission)  OB/GYN Status:  No LMP for male patient.  General Assessment Data [facility] Assessment: In system Is this a Tele or Face-to-Face Assessment?: Face-to-Face Is this an Initial Assessment or a Re-assessment for this encounter?: Initial Assessment Marital status: Single Maiden name: n/a Is patient pregnant?: No Pregnancy Status: No Living Arrangements: Other (Comment) (grandfather) Can pt return to current living arrangement?: Yes Admission Status: Involuntary Is patient capable of signing voluntary admission?: Yes Referral Source: Other [facility]     Crisis Care Plan Living Arrangements: Other (Comment) (grandfather) Name of Psychiatrist: n/a Name of Therapist: n/a  Education Status Is patient currently in school?: No Current Grade: n/a Highest grade of school patient has completed: 12 Name of school: n/a Contact person: none given  Risk to self with the past 6 months Suicidal Ideation: No Has patient been a risk to self within the past 6 months prior to admission? : No Suicidal Intent: No Has patient had any suicidal intent within the past 6 months prior to admission? : No Is patient at risk for suicide?: No Suicidal Plan?: No Has patient had any suicidal plan within the past 6 months prior to admission? : No Access to Means: No What has been your use of drugs/alcohol within the last 12 months?: denies Previous Attempts/Gestures: No How many times?: 0 Other Self Harm Risks: none known  Triggers for Past Attempts: Unknown Intentional Self Injurious Behavior: None Family Suicide History: No Recent stressful life event(s): Turmoil (Comment) Persecutory voices/beliefs?: No Depression: Yes Depression Symptoms:  Despondent, Insomnia, Tearfulness, Isolating, Fatigue, Guilt, Loss of interest in usual pleasures Substance abuse history and/or treatment for substance abuse?: Yes Suicide prevention information given to non-admitted patients: Not applicable  Risk to Others within the past 6 months Homicidal Ideation: No Does patient have any lifetime risk of violence toward others beyond the six months prior to admission? : No Thoughts of Harm to Others: No Current Homicidal Intent: No Current Homicidal Plan: No Access to Homicidal Means: No Identified Victim: none History of harm to others?: No Assessment of Violence: None Noted Violent Behavior Description: none Does patient have access to weapons?: No Criminal Charges Pending?: No Does patient have a court date: No Is patient on probation?: No  Psychosis Hallucinations: None noted Delusions: None noted  Mental Status Report Appearance/Hygiene: In scrubs Eye Contact: Good Motor Activity: Freedom of movement Speech: Logical/coherent Level of Consciousness: Alert Mood: Anxious Affect: Anxious Anxiety Level: Moderate Thought Processes: Coherent, Relevant Judgement: Impaired Orientation: Person, Place, Time, Situation, Appropriate for developmental age Obsessive Compulsive Thoughts/Behaviors: Moderate  Cognitive Functioning Concentration: Decreased Memory: Recent Intact, Remote Intact IQ: Average Insight: Poor Impulse Control: Poor Appetite: Fair Weight Loss: 0 Weight Gain: 0 Sleep: No Change Total Hours of Sleep: 4 Vegetative Symptoms: None  ADLScreening [facility] Services) Patient's cognitive ability adequate to safely complete daily activities?: Yes Patient able to express need for assistance with ADLs?: Yes Independently performs ADLs?: Yes (appropriate for developmental age)  Prior Inpatient Therapy Prior Inpatient Therapy: Yes Prior Therapy Dates: 08/2016 [facility] Reason for  Treatment: Bipolar  Prior Outpatient Therapy Prior Outpatient Therapy: No Prior Therapy Dates: n/a Prior Therapy Facilty/Provider(s): n/a Reason for Treatment: n/a Does patient have an ACCT team?: No Does patient have Intensive In-House Services?  : No Does patient have [facility] services? : No Does patient have P4CC services?: No  ADL Screening (condition at time of admission) Patient's cognitive ability adequate to safely complete daily activities?: Yes Is the patient deaf or have difficulty hearing?: No Does the patient have difficulty seeing, even when wearing glasses/contacts?: No Does the patient have difficulty concentrating, remembering, or making decisions?: No Patient able to express need for assistance with ADLs?: Yes Does the patient have difficulty dressing or bathing?: No Independently performs ADLs?: Yes (appropriate for developmental age) Does the patient have difficulty walking or climbing stairs?: No Weakness of Legs: None Weakness of Arms/Hands: None       Abuse/Neglect Assessment (Assessment to be complete while patient is alone) Physical Abuse: Denies Verbal Abuse: Denies Sexual Abuse: Denies Exploitation of patient/patient's resources: Denies Self-Neglect: Denies Values / Beliefs Cultural Requests During Hospitalization: None Spiritual Requests During Hospitalization: None   Advance Directives (For Healthcare) Does patient have an advance directive?: No Would patient like information on creating an advanced directive?: No - patient declined information    Additional Information 1:1 In Past 12 Months?: No CIRT Risk: No Elopement Risk: Yes Does patient have medical clearance?: Yes     Disposition: Per Dr. Toni Amendlapacs, meets inpatient criteria for psychiatric care. Disposition Initial Assessment Completed for this Encounter: Yes Disposition of Patient: Inpatient treatment program Type of inpatient treatment program: Adult  Hipolito BayleyShean K  Margareta Laureano 09/02/2016 4:18 PM

## 2016-09-02 NOTE — ED Notes (Addendum)
Pt given dinner tray. When asked if he wanted something else to drink he stated, "I just want to feel safe."   Pt does not want his mother to have his code.  Maintained on 15 minute checks and observation by security camera for safety.

## 2016-09-02 NOTE — Progress Notes (Signed)
[facility] Toni Amendlapacs.  Attending Physician will be Dr. Jennet MaduroPucilowska.   Patient [facility] Marylu LundJanet.    ER staff is aware of the admission ( ER Sect.; ER MD; Patient's Nurse & Patient Access). Kaylea Mounsey K. Sherlon HandingHarris, LCAS-A, LPC-A, [facility]  Counselor 09/02/2016 6:07 PM

## 2016-09-02 NOTE — Consult Note (Signed)
Leonard Davidson Consult   Reason for Consult:  Consult for 20 year old man with history of bipolar disorder brought in under IVC after becoming agitated and threatening at home Referring Physician:  Darl Householder Patient Identification: Leonard Davidson MRN:  [id number] Principal Diagnosis: Bipolar disorder, current episode manic severe with psychotic features [facility]) Diagnosis:   Patient Active Problem List   Diagnosis Date Noted  . Noncompliance [Z91.19] 09/02/2016  . Cannabis use disorder, severe, dependence ([place]) [F12.20] 08/16/2016  . Bipolar disorder, current episode manic severe with psychotic features ([place]) [F31.2] 08/16/2016    Total Time spent with patient: 1 hour  Subjective:   Leonard Davidson is a 20 y.o. male patient admitted with "I'm just wanting to be free".  HPI:  Patient interviewed. Chart reviewed. Labs and vitals reviewed. 20 year old man with history of new bipolar disorder who was just discharged from the hospital a couple weeks ago returns under IVC today. Commitment paperwork reports that he's been threatening and agitated at home and noncompliant with his medicine. On interview today the patient was calm and cooperative but he admitted that he was threatening and angry towards his family at home. He expresses a belief that his grandmother and mother are "torturing" his grandfather. He says that he can hear his grandfather screaming at times even though nobody else can. He admits that he was pounding his fist into his hand and that he was feeling like he had to "defend" his family but the person he was apparently defending against was his grandmother. He admits that he has not been compliant with psychiatric medicine he was given at discharge. He says he has not been drinking or using any drugs but we don't have a drug screen back yet. He says the daytime has been reasonably okay but at night he is feeling "terrible". He can't sleep and it sounds like he gets very  paranoid and feels like people are out to get him at night.  Substance abuse history: History of abuse of cannabis but he says he's been trying to stay off at the last couple weeks.  Medical history: This afternoon after being brought to the emergency room he made a sudden run for at and smashed his head into the glass doors where the ambulance comes in and sustained a small laceration to his for head. He was not knocked out. No other medical problems outside the mental health.  Social history: Young man who lives with his grandparents. Mother there part time. He is not currently working. Sounds like he spends most of his day just hanging around playing basketball by himself or playing video games. Not working.  Past Psychiatric History: Patient had some mental health and behavior problems as a child and says that he had a suicide attempt many years ago but had not had any hospitalizations recently until earlier this month. Appears to probably have bipolar disorder or a new onset psychotic disorder. Was being treated with risperidone and reportedly showing good improvement when he was discharged.  Risk to Self: Is patient at risk for suicide?: No Risk to Others:   Prior Inpatient Therapy:   Prior Outpatient Therapy:    Past Medical History:  Past Medical History:  Diagnosis Date  . Bipolar 1 disorder ([place])    History reviewed. No pertinent surgical history. Family History: No family history on file. Family Psychiatric  History: Leonard Davidson reportedly has bipolar disorder. Social History:  History  Alcohol Use No     History  Drug Use  .  Types: Marijuana    Social History   Social History  . Marital status: Single    Spouse name: N/A  . Number of children: N/A  . Years of education: N/A   Social History Main Topics  . Smoking status: Never Smoker  . Smokeless tobacco: Never Used  . Alcohol use No  . Drug use:     Types: Marijuana  . Sexual activity: Not Asked   Other  Topics Concern  . None   Social History Narrative  . None   Additional Social History:    Allergies:  No Known Allergies  Labs:  Results for orders placed or performed during the hospital encounter of 09/02/16 (from the past 48 hour(s))  Comprehensive metabolic panel     Status: Abnormal   Collection Time: 09/02/16  1:26 PM  Result Value Ref Range   Sodium 133 (L) 135 - 145 mmol/L   Potassium 3.7 3.5 - 5.1 mmol/L   Chloride 100 (L) 101 - 111 mmol/L   CO2 23 22 - 32 mmol/L   Glucose, Bld 101 (H) 65 - 99 mg/dL   BUN 18 6 - 20 mg/dL   Creatinine, Ser 0.94 0.61 - 1.24 mg/dL   Calcium 9.5 8.9 - 10.3 mg/dL   Total Protein 8.1 6.5 - 8.1 g/dL   Albumin 5.0 3.5 - 5.0 g/dL   AST 139 (H) 15 - 41 U/L   ALT 292 (H) 17 - 63 U/L   Alkaline Phosphatase 67 38 - 126 U/L   Total Bilirubin 0.2 (L) 0.3 - 1.2 mg/dL   GFR calc non Af Amer >60 >60 mL/min   GFR calc Af Amer >60 >60 mL/min    Comment: (NOTE) The eGFR has been calculated using the CKD EPI equation. This calculation has not been validated in all clinical situations. eGFR's persistently <60 mL/min signify possible Chronic Kidney Disease.    Anion gap 10 5 - 15  Ethanol     Status: None   Collection Time: 09/02/16  1:26 PM  Result Value Ref Range   Alcohol, Ethyl (B) <5 <5 mg/dL    Comment:        LOWEST DETECTABLE LIMIT FOR SERUM ALCOHOL IS 5 mg/dL FOR MEDICAL PURPOSES ONLY   Salicylate level     Status: None   Collection Time: 09/02/16  1:26 PM  Result Value Ref Range   Salicylate Lvl <5.4 2.8 - 30.0 mg/dL  Acetaminophen level     Status: Abnormal   Collection Time: 09/02/16  1:26 PM  Result Value Ref Range   Acetaminophen (Tylenol), Serum <10 (L) 10 - 30 ug/mL    Comment:        THERAPEUTIC CONCENTRATIONS VARY SIGNIFICANTLY. A RANGE OF 10-30 ug/mL MAY BE AN EFFECTIVE CONCENTRATION FOR MANY PATIENTS. HOWEVER, SOME ARE BEST TREATED AT CONCENTRATIONS OUTSIDE THIS RANGE. ACETAMINOPHEN CONCENTRATIONS >150 ug/mL AT 4  HOURS AFTER INGESTION AND >50 ug/mL AT 12 HOURS AFTER INGESTION ARE OFTEN ASSOCIATED WITH TOXIC REACTIONS.   cbc     Status: None   Collection Time: 09/02/16  1:26 PM  Result Value Ref Range   WBC 7.4 3.8 - 10.6 K/uL   RBC 4.66 4.40 - 5.90 MIL/uL   Hemoglobin 14.2 13.0 - 18.0 g/dL   HCT 40.7 40.0 - 52.0 %   MCV 87.3 80.0 - 100.0 fL   MCH 30.5 26.0 - 34.0 pg   MCHC 34.9 32.0 - 36.0 g/dL   RDW 13.3 11.5 - 14.5 %   Platelets  176 150 - 440 K/uL    Current Facility-Administered Medications  Medication Dose Route Frequency Provider Last Rate Last Dose  . diphenhydrAMINE (BENADRYL) capsule 25 mg  25 mg Oral BID Drenda Freeze, MD   25 mg at 09/02/16 1536  . lidocaine-EPINEPHrine (XYLOCAINE-EPINEPHrine) 1 %-1:200000 (PF) injection           . risperiDONE (RISPERDAL) tablet 2 mg  2 mg Oral BID Drenda Freeze, MD   2 mg at 09/02/16 1536   Current Outpatient Prescriptions  Medication Sig Dispense Refill  . BANOPHEN 25 MG capsule Take 1 capsule by mouth 2 (two) times daily.     . risperiDONE (RISPERDAL) 2 MG tablet Take 1 tablet (2 mg total) by mouth 2 (two) times daily. Please take 1 tab in am and 2 tabs at bedtime 90 tablet 0    Musculoskeletal: Strength & Muscle Tone: within normal limits Gait & Station: normal Patient leans: N/A  Psychiatric Specialty Exam: Physical Exam  Nursing note and vitals reviewed. Constitutional: He appears well-developed and well-nourished.  HENT:  Head: Normocephalic and atraumatic.  Eyes: Conjunctivae are normal. Pupils are equal, round, and reactive to light.  Neck: Normal range of motion.  Cardiovascular: Regular rhythm and normal heart sounds.   Respiratory: Effort normal. No respiratory distress.  GI: Soft.  Musculoskeletal: Normal range of motion.  Neurological: He is alert.  Skin: Skin is warm and dry.     Psychiatric: His affect is blunt. His speech is delayed. He is slowed. Thought content is paranoid and delusional. He expresses  impulsivity. He expresses no homicidal and no suicidal ideation. He exhibits abnormal recent memory.    Review of Systems  Constitutional: Negative.   HENT: Negative.   Eyes: Negative.   Respiratory: Negative.   Cardiovascular: Negative.   Gastrointestinal: Negative.   Musculoskeletal: Negative.   Skin: Negative.   Neurological: Negative.   Psychiatric/Behavioral: Positive for depression, hallucinations and memory loss. Negative for substance abuse and suicidal ideas. The patient is nervous/anxious and has insomnia.     Blood pressure 131/78, pulse 92, temperature 98.6 F (37 C), temperature source Oral, resp. rate 17, height 6' 2"  (1.88 m), weight 63.5 kg (140 lb), SpO2 99 %.Body mass index is 17.97 kg/m.  General Appearance: Fairly Groomed  Eye Contact:  Fair  Speech:  Slow  Volume:  Decreased  Mood:  Depressed  Affect:  Constricted  Thought Process:  Disorganized  Orientation:  Full (Time, Place, and Person)  Thought Content:  Illogical and Paranoid Ideation  Suicidal Thoughts:  No  Homicidal Thoughts:  Yes.  without intent/plan  Memory:  Immediate;   Good Recent;   Fair Remote;   Fair  Judgement:  Impaired  Insight:  Shallow  Psychomotor Activity:  Decreased  Concentration:  Concentration: Fair  Recall:  [employer] of Knowledge:  Fair  Language:  Fair  Akathisia:  Negative  Handed:  Right  AIMS (if indicated):     Assets:  Communication Skills Desire for Improvement Housing Physical Health Social Support  ADL's:  Intact  Cognition:  WNL  Sleep:        Treatment Plan Summary: Daily contact with patient to assess and evaluate symptoms and progress in treatment, Medication management and Plan 20 year old man with bipolar disorder versus new psychotic disorder who presents still having delusions hallucinations paranoia and intermittently threatening behavior at home. Patient is quite different in his mental status and presentation compared to how I saw him last  time he  came into the hospital. That time his affect was euphoric boastful and hypersexual. Today he looks more depressed and down and confused. Patient will be admitted to the psychiatric ward. Restart medications as prescribed last time. 15 minute checks in place. Patient is advised to the plan and was actually agreeable to it.  Disposition: Recommend psychiatric Inpatient admission when medically cleared. Supportive therapy provided about ongoing stressors.  Alethia Berthold, MD 09/02/2016 4:16 PM

## 2016-09-02 NOTE — ED Notes (Signed)
PT IVC/ PENDING PLACEMENT  

## 2016-09-02 NOTE — ED Notes (Signed)
Pt is alert and oriented this evening. Pt mood is depressed/withdrawn and affect is blunted. Pt seems paranoid and guarded but is pleasant and cooperative with staff. Writer explained that tx plan is in progress, offered fluids and snack and 15 minute checks are ongoing for safety. [facility] Writer will administer medications prior to transport.

## 2016-09-02 NOTE — ED Notes (Signed)
Patient resting quietly in room. No noted distress or abnormal behaviors noted. Will continue 15 minute checks and observation by security camera for safety. 

## 2016-09-02 NOTE — ED Provider Notes (Signed)
[facility]-EMERGENCY DEPARTMENT Provider Note   CSN: [id number] Arrival date & time: 09/02/16  1320     History   Chief Complaint Chief Complaint  Patient presents with  . Psychiatric Evaluation    HPI Leonard Davidson is a 20 y.o. male hx of bipolar, Who presented with aggressive behavior, agitation. Patient brought in by police for agitation and aggressive behavior. He has a history of bipolar and has not been taking his medicines. He was arguing with mother and threatened his grandmother. Came by IVC by police. While in the ED, he decided to leave and ran into the door. Has no LOC. Has L eyebrow laceration. Patient refused to answer questions.    The history is provided by the patient.    Past Medical History:  Diagnosis Date  . Bipolar 1 disorder [facility](HCC)     Patient Active Problem List   Diagnosis Date Noted  . Cannabis use disorder, severe, dependence (HCC) 08/16/2016  . Bipolar disorder, current episode manic severe with psychotic features ([facility]) 08/16/2016    History reviewed. No pertinent surgical history.     Home Medications    Prior to Admission medications   Medication Sig Start Date End Date Taking? Authorizing Provider  BANOPHEN 25 MG capsule Take 1 capsule by mouth 2 (two) times daily.  08/20/16  Yes Historical Provider, MD  risperiDONE (RISPERDAL) 2 MG tablet Take 1 tablet (2 mg total) by mouth 2 (two) times daily. Please take 1 tab in am and 2 tabs at bedtime 08/20/16  Yes Jimmy FootmanAndrea Hernandez-Gonzalez, MD    Family History No family history on file.  Social History Social History  Substance Use Topics  . Smoking status: Never Smoker  . Smokeless tobacco: Never Used  . Alcohol use No     Allergies   Review of patient's allergies indicates no known allergies.   Review of Systems Review of Systems  Skin: Positive for wound.  Psychiatric/Behavioral: Positive for agitation.  All other systems reviewed and are negative.    Physical  Exam Updated Vital Signs BP 115/76 (BP Location: Left Arm)   Pulse 85   Temp 98.6 F (37 C) (Oral)   Resp 18   Ht 6\' 2"  (1.88 m)   Wt 140 lb (63.5 kg)   SpO2 98%   BMI 17.97 kg/m   Physical Exam  Constitutional: He is oriented to person, place, and time.  Agitated, refused to make eye contact   HENT:  Head: Normocephalic.  2 cm vertical laceration through L eyebrow   Eyes: EOM are normal. Pupils are equal, round, and reactive to light.  Neck: Normal range of motion. Neck supple.  Cardiovascular: Normal rate, regular rhythm and normal heart sounds.   Pulmonary/Chest: Effort normal and breath sounds normal. No respiratory distress. He has no wheezes. He has no rales.  Abdominal: Soft. Bowel sounds are normal. He exhibits no distension. There is no tenderness. There is no guarding.  Musculoskeletal: Normal range of motion.  Neurological: He is alert and oriented to person, place, and time.  Skin: Skin is warm.  Psychiatric: He has a normal mood and affect.  Nursing note and vitals reviewed.    ED Treatments / Results  Labs (all labs ordered are listed, but only abnormal results are displayed) Labs Reviewed  COMPREHENSIVE METABOLIC PANEL - Abnormal; Notable for the following:       Result Value   Sodium 133 (*)    Chloride 100 (*)    Glucose, Bld 101 (*)  AST 139 (*)    ALT 292 (*)    Total Bilirubin 0.2 (*)    All other components within normal limits  ACETAMINOPHEN LEVEL - Abnormal; Notable for the following:    Acetaminophen (Tylenol), Serum <10 (*)    All other components within normal limits  ETHANOL  SALICYLATE LEVEL  CBC  URINE DRUG SCREEN, QUALITATIVE ([facility] ONLY)    EKG  EKG Interpretation None       Radiology No results found.  Procedures Procedures (including critical care time)  LACERATION REPAIR Performed by: Richardean Canalavid H Correy Weidner Authorized by: Richardean Canalavid H Jacques Willingham Consent: Verbal consent obtained. Risks and benefits: risks, benefits and alternatives  were discussed Consent given by: patient Patient identity confirmed: provided demographic data Prepped and Draped in normal sterile fashion Wound explored  Laceration Location: L eyebrow  Laceration Length:2 cm  No Foreign Bodies seen or palpated  Anesthesia: local infiltration  Local anesthetic: lidocaine 1 % with epinephrine  Anesthetic total: 3 ml  Irrigation method: syringe Amount of cleaning: standard  Skin closure: 6-0 ethilon  Number of sutures: 3  Technique: simple interrupted   Patient tolerance: Patient tolerated the procedure well with no immediate complications.   Medications Ordered in ED Medications  lidocaine-EPINEPHrine (XYLOCAINE-EPINEPHrine) 1 %-1:200000 (PF) injection (not administered)     Initial Impression / Assessment and Plan / ED Course  I have reviewed the triage vital signs and the nursing notes.  Pertinent labs & imaging results that were available during my care of the patient were reviewed by me and considered in my medical decision making (see chart for details).  Clinical Course   Leonard Davidson is a 20 y.o. male here with aggressive behavior, agitation, eyebrow laceration. Patient under IVC. I filled out first exam. I sutured laceration. Vitals stable. Reviewed his labs. NA 133 and AST/ALT mildly elevated at 139/292. No abdominal tenderness. ETOH neg, denies drug use or previous hep C. Since abdomen nontender and he appears well, will not need further workup for elevated LFT. Medically cleared at this point. Will consult TTS and psych. Suture removal in a week.    Final Clinical Impressions(s) / ED Diagnoses   Final diagnoses:  None    New Prescriptions New Prescriptions   No medications on file     Charlynne Panderavid Hsienta Merik Mignano, MD 09/02/16 1539

## 2016-09-02 NOTE — ED Notes (Signed)
Pt transferred to behavioral holding unit  - report given to Maleyah Evans B RN

## 2016-09-03 DIAGNOSIS — F312 Bipolar disorder, current episode manic severe with psychotic features: Principal | ICD-10-CM

## 2016-09-03 LAB — URINE DRUG SCREEN, QUALITATIVE (ARMC ONLY)
Amphetamines, Ur Screen: NOT DETECTED
BARBITURATES, UR SCREEN: NOT DETECTED
BENZODIAZEPINE, UR SCRN: NOT DETECTED
CANNABINOID 50 NG, UR ~~LOC~~: NOT DETECTED
COCAINE METABOLITE, UR ~~LOC~~: NOT DETECTED
MDMA (Ecstasy)Ur Screen: NOT DETECTED
METHADONE SCREEN, URINE: NOT DETECTED
Opiate, Ur Screen: NOT DETECTED
Phencyclidine (PCP) Ur S: NOT DETECTED
TRICYCLIC, UR SCREEN: NOT DETECTED

## 2016-09-03 MED ORDER — PALIPERIDONE PALMITATE 234 MG/1.5ML IM SUSP
234.0000 mg | Freq: Once | INTRAMUSCULAR | Status: AC
  Administered 2016-09-03: 234 mg via INTRAMUSCULAR
  Filled 2016-09-03: qty 1.5

## 2016-09-03 MED ORDER — PALIPERIDONE PALMITATE 234 MG/1.5ML IM SUSP: 234.0000 mg | INTRAMUSCULAR | Status: DC

## 2016-09-03 NOTE — Plan of Care (Signed)
Problem: Coping: Goal: Ability to demonstrate self-control will improve Outcome: Progressing Pt demonstrated adequate self-control this evening.   

## 2016-09-03 NOTE — [facility] Suicide Risk Assessment (Signed)
[facility]   Nursing information obtained from:  Patient Demographic factors:  Male, Adolescent or young adult Current Mental Status:  NA (pt denies) Loss Factors:  NA Historical Factors:  Family history of mental illness or substance abuse Risk Reduction Factors:  Sense of responsibility to family, Employed, Living with another person, especially a relative, Positive social support  Total Time spent with patient: 1 hour Principal Problem: Bipolar disorder, current episode manic severe with psychotic features (H[facility]) Diagnosis:   Patient Active Problem List   Diagnosis Date Noted  . Noncompliance [Z91.19] 09/02/2016  . Cannabis use disorder, severe, dependence (HCC) [F12.20] 08/16/2016  . Bipolar disorder, current episode manic severe with psychotic features (HCC) [F31.2] 08/16/2016   Subjective Data: agitation, threatening behavior.  Continued Clinical Symptoms:  Alcohol Use Disorder Identification Test Final Score (AUDIT): 0 The "Alcohol Use Disorders Identification Test", Guidelines for Use in Primary Care, Second Edition.  World Science writerHealth Organization [facility](WHO). Score between 0-7:  no or low risk or alcohol related problems. Score between 8-15:  moderate risk of alcohol related problems. Score between 16-19:  high risk of alcohol related problems. Score 20 or above:  warrants further diagnostic evaluation for alcohol dependence and treatment.   CLINICAL FACTORS:   Bipolar Disorder:   Mixed State Depression:   Impulsivity   Musculoskeletal: Strength & Muscle Tone: within normal limits Gait & Station: normal Patient leans: N/A  Psychiatric Specialty Exam: Physical Exam  Nursing note and vitals reviewed.   Review of Systems  Psychiatric/Behavioral: Positive for depression, hallucinations and suicidal ideas.  All other systems reviewed and are negative.   Blood pressure 133/70, pulse 74, temperature 97.8 F (36.6 C), temperature source Oral, resp.  rate 18, height 6\' 2"  (1.88 m), weight 63.5 kg (140 lb).Body mass index is 17.98 kg/m.  See H&P                                                  Sleep:  Number of Hours: 7      COGNITIVE FEATURES THAT CONTRIBUTE TO RISK:  None    SUICIDE RISK:   Moderate:  Frequent suicidal ideation with limited intensity, and duration, some specificity in terms of plans, no associated intent, good self-control, limited dysphoria/symptomatology, some risk factors present, and identifiable protective factors, including available and accessible social support.   PLAN OF CARE: Hospital admission, medication management, discharge planning.  Leonard Davidson is a 20 year old male with history of bipolar disorder admitted for agitation and threatening behavior at the house in the context of medication noncompliance.  1. Agitation. This has resolved.  2. Suicidal and homicidal ideation. The patient is able to contract for safety in the hospital.  3. Mood and psychosis. He was restarted on Risperdal that was helpful in the past.  4. Insomnia. Trazodone is available.  5. Anxiety. Vistaril is available.  6. Metabolic syndromes screening. It was completed during his recent hospitalization.  7. Disposition. He will be discharged home with his grandparents. He will follow up with his regular psychiatrist.   I certify that inpatient services furnished can reasonably be expected to improve the patient's condition.  Kristine LineaJolanta Hikaru Delorenzo, MD 09/03/2016, 9:47 AM

## 2016-09-03 NOTE — Tx Team (Signed)
Initial Treatment Plan 09/03/2016 5:17 AM Leonard PancoastWilliam J Davidson [id number]    PATIENT STRESSORS: Marital or family conflict   PATIENT STRENGTHS: Active sense of humor Average or above average intelligence Physical Health   PATIENT IDENTIFIED PROBLEMS: Bipolar disorder  Medication non-compliance  Conflict at home                 DISCHARGE CRITERIA:  Ability to meet basic life and health needs Medical problems require only outpatient monitoring Motivation to continue treatment in a less acute level of care  PRELIMINARY DISCHARGE PLAN: Outpatient therapy  PATIENT/FAMILY INVOLVEMENT: This treatment plan has been presented to and reviewed with the patient, Leonard Davidson.  The patient and family have been given the opportunity to ask questions and make suggestions.  Rockie NeighboursLuke B Derrian Poli, RN 09/03/2016, 5:17 AM

## 2016-09-03 NOTE — [facility] Group Notes (Signed)
[facility] LCSW Group Therapy Note  Date/Time 09/03/2016 3:00pm  Type of Therapy/Topic:  Group Therapy:  Feelings about Diagnosis  Participation Level:  Minimal   Mood:Good mood    Description of Group:    This group will allow patients to explore their thoughts and feelings about diagnoses they have received. Patients will be guided to explore their level of understanding and acceptance of these diagnoses. Facilitator will encourage patients to process their thoughts and feelings about the reactions of others to their diagnosis, and will guide patients in identifying ways to discuss their diagnosis with significant others in their lives. This group will be process-oriented, with patients participating in exploration of their own experiences as well as giving and receiving support and challenge from other group members.   Therapeutic Goals: 1. Patient will demonstrate understanding of diagnosis as evidence by identifying two or more symptoms of the disorder:  2. Patient will be able to express two feelings regarding the diagnosis 3. Patient will demonstrate ability to communicate their needs through discussion and/or role plays  Summary of Patient Progress:  Pt unable to achieve therapeutic goals as he is currently to acute to process much, responding to internal stimuli, giggling inappropriately, asking unrelated questions. But he did attend and was redirectable when necessary.   Therapeutic Modalities:   Cognitive Behavioral Therapy Brief Therapy Feelings Identification

## 2016-09-03 NOTE — BHH Counselor (Signed)
CSW attempted to complete PSA with pt and patient asked CSW, "Do I have to?" CSW said this PSA will assist the pt in facilitating treatment and pt reported he would comply.  Pt then answered all of the CSW's questions demonstrating reaction-formation defense mechanisms with exaggerated opposite answers to all the CSW questions as evidenced by the following. Pt answered the  CSW's question, "Do you still have limited social interaction with others, as you reported on your previous admission?" and pt answered "Naw, I have great social interaction, huge social interaction".  A similar question and answer sequence was repeated twice more.  CSW asked pt if pt wanted to return to [facility], the pt's previous outpatient follow-up and the pt said, no.  CSW then asked if pt wanted [facility] and the pt said "No".  CSW told pt that the pt had the right to refuse follow-up outpatient services after he leaves the hospital if he wanted and the pt replied, "I refuse".  CSW told pt he had the choice to leave the room if the pt did not want to complete the pt's assessment and the pt left the room.  Dorothe PeaJonathan F. Calvyn Kurtzman, LCSWA, LCAS   09/03/16

## 2016-09-03 NOTE — Tx Team (Signed)
Interdisciplinary Treatment and Diagnostic Plan Update  09/03/2016 Time of Session: 10:30am Leonard Davidson MRN: [id number]  Principal Diagnosis: Bipolar disorder, current episode manic severe with psychotic features [facility](HCC)  Secondary Diagnoses: Principal Problem:   Bipolar disorder, current episode manic severe with psychotic features (HCC) Active Problems:   Cannabis use disorder, severe, dependence (HCC)   Noncompliance   Current Medications:  Current Facility-Administered Medications  Medication Dose Route Frequency Provider Last Rate Last Dose  . acetaminophen (TYLENOL) tablet 650 mg  650 mg Oral Q6H PRN [name], MD      . alum & mag hydroxide-simeth (MAALOX/MYLANTA) 200-200-20 MG/5ML suspension 30 mL  30 mL Oral Q4H PRN [name], MD      . diphenhydrAMINE (BENADRYL) capsule 25 mg  25 mg Oral BID [name], MD   25 mg at 09/03/16 [id number]  . hydrOXYzine (ATARAX/VISTARIL) tablet 25 mg  25 mg Oral TID PRN [name], MD      . magnesium hydroxide (MILK OF MAGNESIA) suspension 30 mL  30 mL Oral Daily PRN [name], MD      . risperiDONE (RISPERDAL) tablet 2 mg  2 mg Oral BID Shari ProwsJolanta B Pucilowska, MD   2 mg at 09/03/16 [id number]  . traZODone (DESYREL) tablet 100 mg  100 mg Oral QHS PRN [name], MD       PTA Medications: Prescriptions Prior to Admission  Medication Sig Dispense Refill Last Dose  . BANOPHEN 25 MG capsule Take 1 capsule by mouth 2 (two) times daily.    unknown at unknown  . risperiDONE (RISPERDAL) 2 MG tablet Take 1 tablet (2 mg total) by mouth 2 (two) times daily. Please take 1 tab in am and 2 tabs at bedtime 90 tablet 0 unknown at unknown    Patient Stressors: Marital or family conflict  Patient Strengths: Active sense of humor Average or above average intelligence Physical Health  Treatment Modalities: Medication Management, Group therapy, Case management,  1 to 1 session with clinician, Psychoeducation, Recreational  therapy.   Physician Treatment Plan for Primary Diagnosis: Bipolar disorder, current episode manic severe with psychotic features (HCC) Long Term Goal(s): Improvement in symptoms so as ready for discharge Improvement in symptoms so as ready for discharge   Short Term Goals: Ability to identify changes in lifestyle to reduce recurrence of condition will improve Ability to verbalize feelings will improve Ability to disclose and discuss suicidal ideas Ability to demonstrate self-control will improve Ability to identify and develop effective coping behaviors will improve Ability to maintain clinical measurements within normal limits will improve Compliance with prescribed medications will improve Ability to identify changes in lifestyle to reduce recurrence of condition will improve Ability to demonstrate self-control will improve Ability to identify triggers associated with substance abuse/mental health issues will improve  Medication Management: Evaluate patient's response, side effects, and tolerance of medication regimen.  Therapeutic Interventions: 1 to 1 sessions, Unit Group sessions and Medication administration.  Evaluation of Outcomes: Progressing  Physician Treatment Plan for Secondary Diagnosis: Principal Problem:   Bipolar disorder, current episode manic severe with psychotic features (HCC) Active Problems:   Cannabis use disorder, severe, dependence (HCC)   Noncompliance  Long Term Goal(s): Improvement in symptoms so as ready for discharge Improvement in symptoms so as ready for discharge   Short Term Goals: Ability to identify changes in lifestyle to reduce recurrence of condition will improve Ability to verbalize feelings will improve Ability to disclose and discuss suicidal ideas Ability  to demonstrate self-control will improve Ability to identify and develop effective coping behaviors will improve Ability to maintain clinical measurements within normal limits will  improve Compliance with prescribed medications will improve Ability to identify changes in lifestyle to reduce recurrence of condition will improve Ability to demonstrate self-control will improve Ability to identify triggers associated with substance abuse/mental health issues will improve     Medication Management: Evaluate patient's response, side effects, and tolerance of medication regimen.  Therapeutic Interventions: 1 to 1 sessions, Unit Group sessions and Medication administration.  Evaluation of Outcomes: Progressing   RN Treatment Plan for Primary Diagnosis: Bipolar disorder, current episode manic severe with psychotic features (HCC) Long Term Goal(s): Knowledge of disease and therapeutic regimen to maintain health will improve  Short Term Goals: Ability to verbalize feelings will improve and Compliance with prescribed medications will improve  Medication Management: RN will administer medications as ordered by provider, will assess and evaluate patient's response and provide education to patient for prescribed medication. RN will report any adverse and/or side effects to prescribing provider.  Therapeutic Interventions: 1 on 1 counseling sessions, Psychoeducation, Medication administration, Evaluate responses to treatment, Monitor vital signs and CBGs as ordered, Perform/monitor CIWA, COWS, AIMS and Fall Risk screenings as ordered, Perform wound care treatments as ordered.  Evaluation of Outcomes: Progressing   LCSW Treatment Plan for Primary Diagnosis: Bipolar disorder, current episode manic severe with psychotic features (HCC) Long Term Goal(s): Safe transition to appropriate next level of care at discharge, Engage patient in therapeutic group addressing interpersonal concerns.  Short Term Goals: Engage patient in aftercare planning with referrals and resources, Increase social support, Increase ability to appropriately verbalize feelings, Increase emotional regulation,  Facilitate acceptance of mental health diagnosis and concerns, Facilitate patient progression through stages of change regarding substance use diagnoses and concerns, Identify triggers associated with mental health/substance abuse issues and Increase skills for wellness and recovery  Therapeutic Interventions: Assess for all discharge needs, 1 to 1 time with Social worker, Explore available resources and support systems, Assess for adequacy in community support network, Educate family and significant other(s) on suicide prevention, Complete Psychosocial Assessment, Interpersonal group therapy.  Evaluation of Outcomes: Progressing   Progress in Treatment: Attending groups: No. CSW still assessing, pt new to milieu Participating in groups: No. CSW still assessing, pt new to milieu Taking medication as prescribed: Yes. Toleration medication: Yes. Family/Significant other contact made: No, will contact:  CSW still assessing for contacts Patient understands diagnosis: Yes. Discussing patient identified problems/goals with staff: Yes. Medical problems stabilized or resolved: Yes Denies suicidal/homicidal ideation: Yes. Issues/concerns per patient self-inventory: No. Other: none reported  New problem(s) identified: No, Describe:  none reported  New Short Term/Long Term Goal(s):  Discharge Plan or Barriers:   Reason for Continuation of Hospitalization: Aggression, anxiety, delusions  Estimated Length of Stay: 3-5 days  Attendees: Patient: Leonard Davidson 09/03/2016 11:00 AM  Physician: Dr. Jennet MaduroPucilowska, MD 09/03/2016 11:00 AM  Nursing: Cheri GuppyGigi Maaniatu 09/03/2016 11:00 AM  RN Care Manager: 09/03/2016 11:00 AM  Social Worker: Dorothe PeaJonathan F. Joffrey Kerce, LCSWA, LCAS 09/03/2016 11:00 AM  Recreational Therapist:  Hershal CoriaBeth Greene, LRT 09/03/2016 11:00 AM  Other:  09/03/2016 11:00 AM  Other:  09/03/2016 11:00 AM  Other: 09/03/2016 11:00 AM    Scribe for Treatment Team: Dorothe PeaJonathan F Bladimir Auman,  LCSWA 09/03/2016

## 2016-09-03 NOTE — BHH Counselor (Signed)
Adult Comprehensive Assessment  Patient ID: Christiana FuchsWilliam J Fauver, male   DOB: 04/04/1996, 20 y.o.   MRN: [id number]  Information Source: Information source: Patient  Current Stressors:  Surveyor, quantityinancial / Lack of resources (include bankruptcy): limited income, working one day a week Social relationships: social interaction is rare Substance abuse: smoking marajuana daily  Living/Environment/Situation:  Living Arrangements: Parent, Other relatives Living conditions (as described by patient or guardian): good, How long has patient lived in current situation?: since a child What is atmosphere in current home: Comfortable, ParamedicLoving, Supportive  Family History:  Marital status: Single Are you sexually active?: Yes What is your sexual orientation?: heterosexual Has your sexual activity been affected by drugs, alcohol, medication, or emotional stress?: Hyper-sexual on admission due to mania Does patient have children?: No  Childhood History:  By whom was/is the patient raised?: Both parents Description of patient's relationship with caregiver when they were a child: good relationships Patient's description of current relationship with people who raised him/her: good Did patient suffer any verbal/emotional/physical/sexual abuse as a child?: No Did patient suffer from severe childhood neglect?: No Has patient ever been sexually abused/assaulted/raped as an adolescent or adult?: No Was the patient ever a victim of a crime or a disaster?: No Witnessed domestic violence?: No Has patient been effected by domestic violence as an adult?: No  Education:  Highest grade of school patient has completed: 12 Currently a student?: No Name of school: n/a Learning disability?: No  Employment/Work Situation:   Employment situation: Employed (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) Has patient ever been in the Eli Lilly and Companymilitary?: No Has patient ever served in Buyer, retailcombat?: No Did You Receive Any  Psychiatric Treatment/Services While in Equities traderthe Military?: No Are There Guns or Other Weapons in Your Home?: No Are These Weapons Safely Secured?: No  Financial Resources:   Financial resources: Income from employment, Support from parents / caregiver Does patient have a representative payee or guardian?: No  Alcohol/Substance Abuse:   What has been your use of drugs/alcohol within the last 12 months?: endorses marajuana daily since age 89 If attempted suicide, did drugs/alcohol play a role in this?: No Alcohol/Substance Abuse Treatment Hx: Denies past history Has alcohol/substance abuse ever caused legal problems?: No  Social Support System:   Conservation officer, natureatient's Community Support System: Fair Museum/gallery exhibitions officerDescribe Community Support System: Parents  Leisure/Recreation:   Leisure and Hobbies: video games, watching TV  Strengths/Needs:   What things does the patient do well?: video games In what areas does patient struggle / problems for patient: socializing  Discharge Plan:   Does patient have access to transportation?: Yes Will patient be returning to same living situation after discharge?: Yes Currently receiving community mental health services: No If no, would patient like referral for services when discharged?: Yes (What county?) (Referral made to [facility] in [place]) Does patient have financial barriers related to discharge medications?: No  Summary/Recommendations:   Summary and Recommendations (to be completed by the evaluator): Patient is a 20 year old male with multiple recent visits to the ED who presented to the hospital under IVC and was admitted for demonstrating threatening behavior and presenting as agitated at home, as well as noncompliance with his medicine . Pt's primary diagnosis is Bipolar disorder, current episode manic severe with psychotic features (HCC). Pt reports primary triggers for admission were worries over the physical well-being of an elderly  relative. Pt reports his stressors are his beliefs that some family members are a danger to other family members,  an inability to sleep at night, paranoia, and audio hallucinations. Pt now denies SI/HI/AVH. [place] Pt lists supports in the community as his immediate family members. Patient will benefit from crisis stabilization, medication evaluation, group therapy, and psycho education in addition to case management for discharge planning. Patient and CSW reviewed pt's identified goals and treatment plan. Pt verbalized understanding and agreed to treatment plan. At discharge it is recommended that patient remain compliant with established plan and continue treatment.    York GriceJonathan Winfrey Chillemi MSW, LCSW 09/04/2016

## 2016-09-03 NOTE — Progress Notes (Deleted)
   09/03/16 2005  Psych Admission Type (Psych Patients Only)  Admission Status Involuntary

## 2016-09-03 NOTE — Progress Notes (Signed)
   09/03/16 2005  Psych Admission Type (Psych Patients Only)  Admission Status Involuntary  Psychosocial Assessment  Patient Complaints None  Eye Contact Fair;Staring (Staring at other patients)  Facial Expression Fixed smile  Affect Euphoric;Silly (Day Room reading Newspapper and singing w/high pitch Voice)  Speech Logical/coherent;Loud  Interaction Childlike (Visible in the Day Room)  Motor Activity Slow;Other (Comment) (WNL)  Appearance/Hygiene Unremarkable  Behavior Characteristics Elopement risk;Wandering risk  Mood Silly (Silly enough to irritate his peers)  Thought Process  Coherency WDL  Content WDL  Delusions WDL  Perception WDL  Hallucination None reported or observed  Judgment Poor  Confusion None  Danger to Self  Current suicidal ideation? Denies  Danger to Others  Danger to Others None reported or observed

## 2016-09-03 NOTE — H&P (Addendum)
Psychiatric Admission Assessment Adult  Patient Identification: MUAAD BOEHNING MRN:  [id number] Date of Evaluation:  09/03/2016 Chief Complaint:  Bipolar 1 Disorder Principal Diagnosis: Bipolar disorder, current episode manic severe with psychotic features [facility]) Diagnosis:   Patient Active Problem List   Diagnosis Date Noted  . Noncompliance [Z91.19] 09/02/2016  . Cannabis use disorder, severe, dependence ([place]) [F12.20] 08/16/2016  . Bipolar disorder, current episode manic severe with psychotic features ([place]) [F31.2] 08/16/2016   History of Present Illness:    Identifying data. Mr. Holzhauer  Is a 20 year old male with a history of psychosis and mood  Instability.   Chief complaint.  " I need medical marijuana."  History of present illness.  Information was obtained  from the patient and the chart. The patient believes that he was brought to the hospital by his family after he had a small argument with his mother. According to the chart, he was brought to the hospital by the police after he became aggressive and swinging at his grandmother. The patient admits that following discharge from our hospital a few weeks ago he did not see mental health provider, neither he prescribed medications. The patient himself does not believe that he has any problems whatsoever. He denies symptoms of depression, anxiety, or psychosis. He has sleep that he smokes marijuana but is negative for substances on admission. He does not drink alcohol. He only wants to discuss medicine marijuana and believes that he should be given medicinal marijuana in the hospital as well as prescription to take home. He is rather grandiose, restless, hyperverbal, delusional, and intrusive. According to the family the patient became increasingly irritable and argumentative leading to agitation and threatening behavior towards his grandmother.  Past psychiatric history. This is his third psychiatric hospitalization. He carries a  diagnosis of bipolar disorder or possibly schizophrenia. He was treated with Risperdal with the results during his recent hospitalization but did not follow up with outpatient treatment. He denies ever attempting suicide.  Family psychiatric history. Grandfather with bipolar illness.  Social history. He graduated from high school "I'm not down". He lives with his grandparents. He works as a Astronomer.    Total Time spent with patient: 1 hour  Is the patient at risk to self? Yes.    Has the patient been a risk to self in the past 6 months? No.  Has the patient been a risk to self within the distant past? No.  Is the patient a risk to others? Yes.    Has the patient been a risk to others in the past 6 months? No.  Has the patient been a risk to others within the distant past? No.   Prior Inpatient Therapy:   Prior Outpatient Therapy:    Alcohol Screening: 1. How often do you have a drink containing alcohol?: Never 9. Have you or someone else been injured as a result of your drinking?: No 10. Has a relative or friend or a doctor or another health worker been concerned about your drinking or suggested you cut down?: No Alcohol Use Disorder Identification Test Final Score (AUDIT): 0 Brief Intervention: AUDIT score less than 7 or less-screening does not suggest unhealthy drinking-brief intervention not indicated Substance Abuse History in the last 12 months:  Yes.   Consequences of Substance Abuse: Negative Previous Psychotropic Medications: Yes  Psychological Evaluations: No  Past Medical History:  Past Medical History:  Diagnosis Date  . Bipolar 1 disorder ([place])    History reviewed. No pertinent surgical history. Family  History: History reviewed. No pertinent family history.  Tobacco Screening: Have you used any form of tobacco in the last 30 days? (Cigarettes, Smokeless Tobacco, Cigars, and/or Pipes): No Social History:  History  Alcohol Use No     History  Drug Use  . Types:  Marijuana    Additional Social History:                           Allergies:  No Known Allergies Lab Results:  Results for orders placed or performed during the hospital encounter of 09/02/16 (from the past 48 hour(s))  Comprehensive metabolic panel     Status: Abnormal   Collection Time: 09/02/16  1:26 PM  Result Value Ref Range   Sodium 133 (L) 135 - 145 mmol/L   Potassium 3.7 3.5 - 5.1 mmol/L   Chloride 100 (L) 101 - 111 mmol/L   CO2 23 22 - 32 mmol/L   Glucose, Bld 101 (H) 65 - 99 mg/dL   BUN 18 6 - 20 mg/dL   Creatinine, Ser 0.94 0.61 - 1.24 mg/dL   Calcium 9.5 8.9 - 10.3 mg/dL   Total Protein 8.1 6.5 - 8.1 g/dL   Albumin 5.0 3.5 - 5.0 g/dL   AST 139 (H) 15 - 41 U/L   ALT 292 (H) 17 - 63 U/L   Alkaline Phosphatase 67 38 - 126 U/L   Total Bilirubin 0.2 (L) 0.3 - 1.2 mg/dL   GFR calc non Af Amer >60 >60 mL/min   GFR calc Af Amer >60 >60 mL/min    Comment: (NOTE) The eGFR has been calculated using the CKD EPI equation. This calculation has not been validated in all clinical situations. eGFR's persistently <60 mL/min signify possible Chronic Kidney Disease.    Anion gap 10 5 - 15  Ethanol     Status: None   Collection Time: 09/02/16  1:26 PM  Result Value Ref Range   Alcohol, Ethyl (B) <5 <5 mg/dL    Comment:        LOWEST DETECTABLE LIMIT FOR SERUM ALCOHOL IS 5 mg/dL FOR MEDICAL PURPOSES ONLY   Salicylate level     Status: None   Collection Time: 09/02/16  1:26 PM  Result Value Ref Range   Salicylate Lvl <8.5 2.8 - 30.0 mg/dL  Acetaminophen level     Status: Abnormal   Collection Time: 09/02/16  1:26 PM  Result Value Ref Range   Acetaminophen (Tylenol), Serum <10 (L) 10 - 30 ug/mL    Comment:        THERAPEUTIC CONCENTRATIONS VARY SIGNIFICANTLY. A RANGE OF 10-30 ug/mL MAY BE AN EFFECTIVE CONCENTRATION FOR MANY PATIENTS. HOWEVER, SOME ARE BEST TREATED AT CONCENTRATIONS OUTSIDE THIS RANGE. ACETAMINOPHEN CONCENTRATIONS >150 ug/mL AT 4 HOURS  AFTER INGESTION AND >50 ug/mL AT 12 HOURS AFTER INGESTION ARE OFTEN ASSOCIATED WITH TOXIC REACTIONS.   cbc     Status: None   Collection Time: 09/02/16  1:26 PM  Result Value Ref Range   WBC 7.4 3.8 - 10.6 K/uL   RBC 4.66 4.40 - 5.90 MIL/uL   Hemoglobin 14.2 13.0 - 18.0 g/dL   HCT 40.7 40.0 - 52.0 %   MCV 87.3 80.0 - 100.0 fL   MCH 30.5 26.0 - 34.0 pg   MCHC 34.9 32.0 - 36.0 g/dL   RDW 13.3 11.5 - 14.5 %   Platelets 176 150 - 440 K/uL    Blood Alcohol level:  Lab Results  Component  Value Date   [facility] <5 09/02/2016   ETH <5 01/60/1093    Metabolic Disorder Labs:  Lab Results  Component Value Date   HGBA1C 5.3 08/17/2016   MPG 105 08/17/2016   No results found for: PROLACTIN Lab Results  Component Value Date   CHOL 119 08/17/2016   TRIG 55 08/17/2016   HDL 32 (L) 08/17/2016   CHOLHDL 3.7 08/17/2016   VLDL 11 08/17/2016   LDLCALC 76 08/17/2016    Current Medications: Current Facility-Administered Medications  Medication Dose Route Frequency Provider Last Rate Last Dose  . acetaminophen (TYLENOL) tablet 650 mg  650 mg Oral Q6H PRN Gonzella Lex, MD      . alum & mag hydroxide-simeth (MAALOX/MYLANTA) 200-200-20 MG/5ML suspension 30 mL  30 mL Oral Q4H PRN Gonzella Lex, MD      . diphenhydrAMINE (BENADRYL) capsule 25 mg  25 mg Oral BID Gonzella Lex, MD   25 mg at 09/03/16 2355  . hydrOXYzine (ATARAX/VISTARIL) tablet 25 mg  25 mg Oral TID PRN Gonzella Lex, MD      . magnesium hydroxide (MILK OF MAGNESIA) suspension 30 mL  30 mL Oral Daily PRN Gonzella Lex, MD      . risperiDONE (RISPERDAL) tablet 2 mg  2 mg Oral BID Clovis Fredrickson, MD   2 mg at 09/03/16 7322  . traZODone (DESYREL) tablet 100 mg  100 mg Oral QHS PRN Gonzella Lex, MD       PTA Medications: Prescriptions Prior to Admission  Medication Sig Dispense Refill Last Dose  . BANOPHEN 25 MG capsule Take 1 capsule by mouth 2 (two) times daily.    unknown at unknown  . risperiDONE (RISPERDAL) 2 MG  tablet Take 1 tablet (2 mg total) by mouth 2 (two) times daily. Please take 1 tab in am and 2 tabs at bedtime 90 tablet 0 unknown at unknown    Musculoskeletal: Strength & Muscle Tone: within normal limits Gait & Station: normal Patient leans: N/A  Psychiatric Specialty Exam: I reviewed physical exam performed in the emergency room and agree with the findings. Physical Exam  Nursing note and vitals reviewed.   Review of Systems  Psychiatric/Behavioral: Positive for depression, hallucinations and suicidal ideas.  All other systems reviewed and are negative.   Blood pressure 133/70, pulse 74, temperature 97.8 F (36.6 C), temperature source Oral, resp. rate 18, height 6' 2"  (1.88 m), weight 63.5 kg (140 lb).Body mass index is 17.98 kg/m.  General Appearance: Casual  Eye Contact:  Good  Speech:  Pressured  Volume:  Normal  Mood:  Euphoric  Affect:  Congruent  Thought Process:  Disorganized and Descriptions of Associations: Tangential  Orientation:  Full (Time, Place, and Person)  Thought Content:  Delusions and Paranoid Ideation  Suicidal Thoughts:  No  Homicidal Thoughts:  No  Memory:  Immediate;   Fair Recent;   Fair Remote;   Fair  Judgement:  Poor  Insight:  Lacking  Psychomotor Activity:  Increased  Concentration:  Concentration: Fair and Attention Span: Fair  Recall:  [employer] of Knowledge:  Fair  Language:  Fair  Akathisia:  No  Handed:  Right  AIMS (if indicated):     Assets:  Communication Skills Desire for Improvement Housing Physical Health Resilience Social Support  ADL's:  Intact  Cognition:  WNL  Sleep:  Number of Hours: 7    Treatment Plan Summary: Daily contact with patient to assess and evaluate symptoms and  progress in treatment and Medication management   Mr. Sofia is a 20 year old male with history of bipolar disorder admitted for agitation and threatening behavior at the house in the context of medication noncompliance.  1. Agitation.  This has resolved.  2. Suicidal and homicidal ideation. The patient is able to contract for safety in the hospital.  3. Mood and psychosis. He was restarted on Risperdal that was helpful in the past. Will increase dose to 3 mg bid. I offered injectable antipsychotic to improve compliance but the patient refuses.  4. Insomnia. Trazodone is available.  5. Anxiety. Vistaril is available.  6. Metabolic syndromes screening. This was completed during his recent hospitalization.  7. Substance abuse. The patient was positive for marijuana and supposedly drinks some. There were no symptoms of alcohol withdrawal. The patient minimizes his problems and declines treatment.  8. Disposition. He will be discharged home with his grandparents. He will follow up with his regular psychiatrist.    Observation Level/Precautions:  15 minute checks  Laboratory:  CBC Chemistry Profile UDS UA  Psychotherapy:    Medications:    Consultations:    Discharge Concerns:    Estimated LOS:  Other:     Physician Treatment Plan for Primary Diagnosis: Bipolar disorder, current episode manic severe with psychotic features ([place]) Long Term Goal(s): Improvement in symptoms so as ready for discharge  Short Term Goals: Ability to identify changes in lifestyle to reduce recurrence of condition will improve, Ability to verbalize feelings will improve, Ability to disclose and discuss suicidal ideas, Ability to demonstrate self-control will improve, Ability to identify and develop effective coping behaviors will improve, Ability to maintain clinical measurements within normal limits will improve and Compliance with prescribed medications will improve  Physician Treatment Plan for Secondary Diagnosis: Principal Problem:   Bipolar disorder, current episode manic severe with psychotic features ([place]) Active Problems:   Cannabis use disorder, severe, dependence ([place])   Noncompliance  Long Term Goal(s): Improvement in symptoms  so as ready for discharge  Short Term Goals: Ability to identify changes in lifestyle to reduce recurrence of condition will improve, Ability to demonstrate self-control will improve and Ability to identify triggers associated with substance abuse/mental health issues will improve  I certify that inpatient services furnished can reasonably be expected to improve the patient's condition.    Orson Slick, MD 10/31/20179:51 AM

## 2016-09-03 NOTE — Progress Notes (Signed)
D: Pt received from [facility]. Pt skin and contraband check was negative for any issues. Patient alert and oriented x4. Patient denies SI/HI/AVH. Pt affect is blunted. Pt expression was flat this evening, and did seemed mildly confused. Pt asked if his mother was here and then asked if writer saw his grandma tonight. Pt seemed to have minimal insight into admission. When pt asked about events leading up to admission pt blamed family making statements like "grandma was being herself, momma was being herself...they thought I was a threat to them." Pt did endorse getting into an argument with family members, but denied physically lashing out. Pt indicated that he had not been taking prescribed medication at home. Pt identified primary stressor as family, "they have negative energy that I react to."  A: Skin and contraband check performed with Tiajuana AmassAbi RN. Oriented pt to unit. Reviewed admission material with pt. Educated pt on unit policy. Offered active listening and support. Provided therapeutic communication. Administered scheduled medications.  R: Pt pleasant and cooperative. Pt medication compliant. Will continue Q15 min. checks. Safety maintained.

## 2016-09-03 NOTE — Progress Notes (Signed)
Pt awake, alert, up on unit today appropriately interacting with peers. Appears sad, blunted, flat. Pt refuses to discuss reason for admission and simply stares at this nurse when questioned. Did report he was not taking his medications at home as prescribed. Medication compliant here at the hospital. Noted to be much more pleasant, cooperative this admission than last admission. Attends group. Denies SI/HI/AVH.   Support and encouragement provided with use of therapeutic communication. Medications administered as ordered to include IM Invega with education. Safety maintained with every 15 minute checks. Will continue to monitor.

## 2016-09-03 NOTE — Plan of Care (Signed)
Problem: Coping: Goal: Ability to verbalize frustrations and anger appropriately will improve Outcome: Progressing Pt appropriately interacts with staff/peers. No signs of anger or aggression here at the hospital.

## 2016-09-03 NOTE — Progress Notes (Signed)
Recreation Therapy Notes  Date: 10.31.17 Time: 9:30 am Location: Craft Room  Group Topic: Communication  Goal Area(s) Addresses:  Patient will communicate effectively with peer. Patient will verbalize the importance of having healthy communication skills.  Behavioral Response: Attentive, Interactive, Disruptive  Intervention: Blind drawing  Activity: Patients were paired up. One patient was given a picture and was instructed to tell the other patient how to draw the picture.   Education: LRT educated patients on healthy communication skills.  Education Outcome: In group clarification offered   Clinical Observations/Feedback: Patient participated in activity. When giving instructions, patient would laugh and kept saying it was not possible. Patient did not contribute to group discussion. Patient would ask random questions during group discussion. LRT had to redirect patient multiple times.  Jacquelynn CreeGreene,Camyla Camposano M, LRT/CTRS 09/03/2016 10:16 AM

## 2016-09-04 ENCOUNTER — Ambulatory Visit: Payer: Self-pay | Admitting: Psychiatry

## 2016-09-04 MED ORDER — DIVALPROEX SODIUM 500 MG PO DR TAB
500.0000 mg | DELAYED_RELEASE_TABLET | Freq: Three times a day (TID) | ORAL | Status: DC
  Administered 2016-09-04 – 2016-09-09 (×16): 500 mg via ORAL
  Filled 2016-09-04 (×16): qty 1

## 2016-09-04 MED ORDER — PALIPERIDONE PALMITATE 156 MG/ML IM SUSP
156.0000 mg | Freq: Once | INTRAMUSCULAR | Status: AC
  Administered 2016-09-07: 156 mg via INTRAMUSCULAR
  Filled 2016-09-04: qty 1

## 2016-09-04 NOTE — Progress Notes (Signed)
Recreation Therapy Notes  Date: 11.01.17 Time: 9:30 am Location: Craft Room  Group Topic: Self-esteem  Goal Area(s) Addresses:  Patient will write at least one positive trait about self. Patient will verbalize benefit of having self-esteem.  Behavioral Response: Disruptive  Intervention: I Am  Activity: Patients were given a worksheet with the letter I on it and instructed to write as many positive traits inside the letter.  Education: LRT educated patients on ways to increase their self-esteem.  Education Outcome: Patient was asked to leave group before LRT educated patients.   Clinical Observations/Feedback: Before group started, patient was complaining about getting out of bed. Patient stated, "But I knew my girl would want to see me." Patient wrote positive traits. Patient was focused on snack time and wanted to know when snack was. LRT attempted to redirect patient. Patient continued being disruptive. LRT asked patient to go back to his room because he was unable to stay focused. Patient left and started messing with the stereo. LRT redirected patient. Patient returned to group later. LRT informed patient he needed to go back to his room. Patient made a kissy face before he left. Patient continued to try to come back to group. LRT informed patient he needed to go to his room.  Jacquelynn CreeGreene,Janyce Ellinger M, LRT/CTRS 09/04/2016 10:15 AM

## 2016-09-04 NOTE — [facility] Group Notes (Signed)
[facility] LCSW Group Therapy  09/04/2016 2:34 PM  Type of Therapy:  Group Therapy  Participation Level:  Minimal  Participation Quality:  Drowsy, Intrusive and Inattentive  Affect:  Anxious, Defensive and Irritable  Cognitive:  Lacking  Insight:  Off Topic  Engagement in Therapy:  Poor  Modes of Intervention:  Activity, Discussion, Education and Support  Summary of Progress/Problems:Balance in life: Patients will discuss the concept of balance and how it looks and feels to be unbalanced. Pt will identify areas in their life that is unbalanced and ways to become more balanced. Patient needed multiple prompts to remain appropriate during group. Patient would laugh when others were speaking and interrupt his peers. Patient became drowsy about 15 minutes through group and had difficulty saying awake. CSW asked if patient needed to lay down in his room and rest, patient responded "I just need to take a shower but they won't let me". Patient remained in group but did not participate in the group discussion further.    Barack Nicodemus G. Garnette CzechSampson MSW, LCSWA 09/04/2016, 2:37 PM

## 2016-09-04 NOTE — Progress Notes (Addendum)
Ms Dan HumphreysWalker, patient's mother, called to find out how his son is doing, she is concerned that he was discharged too soon the last admission.

## 2016-09-04 NOTE — Progress Notes (Signed)
Leonard Davidson's behaviors are much better tonight, appropriate with peers in the day room, no bizarre behaviors, no animated behaviors, no sexually inappropriate behavior, but patient's highly unpredictable; complied with Depakote DR 500 mg, no PRNs given at this time. Will give PRNs timely per patient's behavior.

## 2016-09-04 NOTE — Progress Notes (Signed)
[facility] MD Progress Note  09/04/2016 2:29 PM Leonard Davidson  MRN:  [id number]  Subjective:  Leonard Davidson is still very psychotic, disorganized, intrusive, unreasonable. He comes to my office every 5 minutes asking if I would give him medicinal marijuana so he can smoke it for free. He has been accepting medications including beta injection given yesterday. This will be followed by second injection on Saturday. We had to ban this patient from groups as he is inappropriate, disruptive, disrespectful to peers and staff. I will start Depakote.  Principal Problem: Bipolar disorder, current episode manic severe with psychotic features [facility]) Diagnosis:   Patient Active Problem List   Diagnosis Date Noted  . Noncompliance [Z91.19] 09/02/2016  . Cannabis use disorder, severe, dependence (HCC) [F12.20] 08/16/2016  . Bipolar disorder, current episode manic severe with psychotic features (HCC) [F31.2] 08/16/2016   Total Time spent with patient: 20 minutes  Past Psychiatric History: Schizophrenia.  Past Medical History:  Past Medical History:  Diagnosis Date  . Bipolar 1 disorder (HCC)    History reviewed. No pertinent surgical history. Family History: History reviewed. No pertinent family history. Family Psychiatric  History: See H&P. Social History:  History  Alcohol Use No     History  Drug Use  . Types: Marijuana    Social History   Social History  . Marital status: Single    Spouse name: N/A  . Number of children: N/A  . Years of education: N/A   Social History Main Topics  . Smoking status: Never Smoker  . Smokeless tobacco: Never Used  . Alcohol use No  . Drug use:     Types: Marijuana  . Sexual activity: Not Asked   Other Topics Concern  . None   Social History Narrative  . None   Additional Social History:                         Sleep: Fair  Appetite:  Fair  Current Medications: Current Facility-Administered Medications  Medication Dose Route  Frequency Provider Last Rate Last Dose  . acetaminophen (TYLENOL) tablet 650 mg  650 mg Oral Q6H PRN Audery Amel, MD      . alum & mag hydroxide-simeth (MAALOX/MYLANTA) 200-200-20 MG/5ML suspension 30 mL  30 mL Oral Q4H PRN Audery Amel, MD      . diphenhydrAMINE (BENADRYL) capsule 25 mg  25 mg Oral BID Audery Amel, MD   25 mg at 09/04/16 0757  . hydrOXYzine (ATARAX/VISTARIL) tablet 25 mg  25 mg Oral TID PRN Audery Amel, MD   25 mg at 09/04/16 1409  . magnesium hydroxide (MILK OF MAGNESIA) suspension 30 mL  30 mL Oral Daily PRN Audery Amel, MD      . [name] ON 09/07/2016] paliperidone (INVEGA SUSTENNA) injection 156 mg  156 mg Intramuscular Once [place] Fritze B Samaiyah Howes, MD      . risperiDONE (RISPERDAL) tablet 2 mg  2 mg Oral BID [name], MD   2 mg at 09/04/16 0757  . traZODone (DESYREL) tablet 100 mg  100 mg Oral QHS PRN Audery Amel, MD   100 mg at 09/03/16 2159    Lab Results:  Results for orders placed or performed during the hospital encounter of 09/02/16 (from the past 48 hour(s))  Urine Drug Screen, Qualitative     Status: None   Collection Time: 09/03/16  9:55 AM  Result Value Ref Range   Tricyclic, Ur Screen NONE  DETECTED NONE DETECTED   Amphetamines, Ur Screen NONE DETECTED NONE DETECTED   MDMA (Ecstasy)Ur Screen NONE DETECTED NONE DETECTED   Cocaine Metabolite,Ur [place] NONE DETECTED NONE DETECTED   Opiate, Ur Screen NONE DETECTED NONE DETECTED   Phencyclidine (PCP) Ur S NONE DETECTED NONE DETECTED   Cannabinoid 50 Ng, Ur [place] NONE DETECTED NONE DETECTED   Barbiturates, Ur Screen NONE DETECTED NONE DETECTED   Benzodiazepine, Ur Scrn NONE DETECTED NONE DETECTED   Methadone Scn, Ur NONE DETECTED NONE DETECTED    Comment: (NOTE) 100  Tricyclics, urine               Cutoff 1000 ng/mL 200  Amphetamines, urine             Cutoff 1000 ng/mL 300  MDMA (Ecstasy), urine           Cutoff 500 ng/mL 400  Cocaine Metabolite, urine       Cutoff 300 ng/mL 500  Opiate,  urine                   Cutoff 300 ng/mL 600  Phencyclidine (PCP), urine      Cutoff 25 ng/mL 700  Cannabinoid, urine              Cutoff 50 ng/mL 800  Barbiturates, urine             Cutoff 200 ng/mL 900  Benzodiazepine, urine           Cutoff 200 ng/mL 1000 Methadone, urine                Cutoff 300 ng/mL 1100 1200 The urine drug screen provides only a preliminary, unconfirmed 1300 analytical test result and should not be used for non-medical 1400 purposes. Clinical consideration and professional judgment should 1500 be applied to any positive drug screen result due to possible 1600 interfering substances. A more specific alternate chemical method 1700 must be used in order to obtain a confirmed analytical result.  1800 Gas chromato graphy / mass spectrometry (GC/MS) is the preferred 1900 confirmatory method.     Blood Alcohol level:  Lab Results  Component Value Date   [facility] 09[facility]   ETH <5 08/14/2016    Metabolic Disorder Labs: Lab Results  Component Value Date   HGBA1C 5.3 08/17/2016   MPG 105 08/17/2016   No results found for: PROLACTIN Lab Results  Component Value Date   CHOL 119 08/17/2016   TRIG 55 08/17/2016   HDL 32 (L) 08/17/2016   CHOLHDL 3.7 08/17/2016   VLDL 11 08/17/2016   LDLCALC 76 08/17/2016    Physical Findings: AIMS:  , ,  ,  ,    CIWA:    COWS:     Musculoskeletal: Strength & Muscle Tone: within normal limits Gait & Station: normal Patient leans: N/A  Psychiatric Specialty Exam: Physical Exam  Nursing note and vitals reviewed.   Review of Systems  Psychiatric/Behavioral: Positive for hallucinations.  All other systems reviewed and are negative.   Blood pressure 123/62, pulse (!) 57, temperature 97.8 F (36.6 C), temperature source Oral, resp. rate 18, height 6\' 2"  (1.88 m), weight 63.5 kg (140 lb).Body mass index is 17.98 kg/m.  General Appearance: Fairly Groomed  Eye Contact:  Good  Speech:  Clear and Coherent  Volume:   Normal  Mood:  Euphoric  Affect:  Congruent  Thought Process:  Disorganized and Descriptions of Associations: Loose  Orientation:  Full (Time, Place, and Person)  Thought Content:  Delusions and Paranoid Ideation  Suicidal Thoughts:  No  Homicidal Thoughts:  No  Memory:  Immediate;   Fair Recent;   Fair Remote;   Fair  Judgement:  Poor  Insight:  Lacking  Psychomotor Activity:  Increased  Concentration:  Concentration: Fair and Attention Span: Fair  Recall:  [employer]  Fund of Knowledge:  Fair  Language:  Fair  Akathisia:  No  Handed:  Left  AIMS (if indicated):     Assets:  Communication Skills Desire for Improvement Housing Physical Health Resilience Social Support  ADL's:  Intact  Cognition:  WNL  Sleep:  Number of Hours: 7     Treatment Plan Summary: Daily contact with patient to assess and evaluate symptoms and progress in treatment and Medication management   [name] is a 20 year old male with history of bipolar disorder admitted for agitation and threatening behavior at the house in the context of medication noncompliance.  1. Agitation. This has resolved. The patient is no longer allow to participate in programming due to disruptive, inappropriate behavior.  2. Suicidal and homicidal ideation. The patient is able to contract for safety in the hospital.  3. Mood and psychosis. He was restarted on Risperdal. He was given injection of Invega Sustenna 234 mg on 10/31. Next injection on 11/4. I will start Depakote for mood stabilization today.  4. Insomnia. Trazodone is available.  5. Anxiety. Vistaril is available.  6. Metabolic syndromes screening. This was completed during his recent hospitalization.  7. Substance abuse. The patient was positive for marijuana and supposedly drinks some. There were no symptoms of alcohol withdrawal. The patient minimizes his problems and declines treatment.  8. Disposition. He will be discharged home with his  grandparents. He will follow up with his regular psychiatrist. RN  Kristine LineaJolanta Tanyika Barros, MD 09/04/2016, 2:29 PM

## 2016-09-04 NOTE — Plan of Care (Signed)
Problem: Safety: Goal: Ability to remain free from injury will improve Outcome: Progressing Trazodone 100 mg and Vistaril 25 mg PO PRN given, 15 minute checks maintained for safety, clinical and moral support provided, patient encouraged to continue to express feelings and demonstrate safe care. Patient remains free from harm, will continue to monitor.

## 2016-09-04 NOTE — Plan of Care (Signed)
Problem: Activity: Goal: Sleeping patterns will improve Outcome: Progressing Pt reports good sleep last night. Reportedly slept 7 hours.

## 2016-09-04 NOTE — [facility] Group Notes (Signed)
[facility] Group Notes:  (Nursing/MHT/Case Management/Adjunct)  Date:  09/04/2016  Time:  3:47 PM  Type of Therapy:  Psychoeducational Skills  Participation Level:  Did Not Attend   Noelle PennerKristen J Yahye Siebert 09/04/2016, 3:47 PM

## 2016-09-04 NOTE — [facility] Group Notes (Signed)
[facility] Group Notes:  (Nursing/MHT/Case Management/Adjunct)  Date:  09/04/2016  Time:  9:45 PM  Type of Therapy:  Psychoeducational Skills  Participation Level:  Active  Participation Quality:  Attentive  Affect:  Lethargic  Cognitive:  Alert  Insight:  Good  Engagement in Group:  Improving  Modes of Intervention:  Support  Summary of Progress/Problems:  Leonard Davidson 09/04/2016, 9:45 PM

## 2016-09-04 NOTE — [facility] Counselor (Deleted)
Adult Comprehensive Assessment  Patient ID: Leonard Davidson, male   DOB: 04/05/1996, 20 y.o.   MRN: [id number]  Information Source: Information source: Patient  Current Stressors:     Living/Environment/Situation:  Living Arrangements: Parent, Other relatives (mother on weekdays, grandmoth on weekends)  Family History:     Childhood History:     Education:     Employment/Work Situation:      Architectinancial Resources:      Alcohol/Substance Abuse:      Social Support System:      Leisure/Recreation:      Strengths/Needs:      Discharge Plan:      Summary/Recommendations:   Emergency planning/management officerummary and Recommendations (to be completed by the evaluator): Patient is a 20 year old male with multiple recent visits to the ED who presented to the hospital under IVC and was admitted for demonstrating threatening behavior and presenting as agitated at home, as well as noncompliance with his medicine . Pt's primary diagnosis is Bipolar disorder, current episode manic severe with psychotic features (HCC). Pt reports primary triggers for admission were worries over the physical well-being of an elderly relative. Pt reports his stressors are his beliefs that some family members are a danger to other family members, an inability to sleep at night, paranoia, and audio hallucinations. Pt now denies SI/HI/AVH. [place] Pt lists supports in the community as his immediate family members. Patient will benefit from crisis stabilization, medication evaluation, group therapy, and psycho education in addition to case management for discharge planning. Patient and CSW reviewed pt's identified goals and treatment plan. Pt verbalized understanding and agreed to treatment plan. At discharge it is recommended that patient remain compliant with established plan and continue treatment.  Leonard PeaJonathan F Lajuanna Pompa. 09/04/2016

## 2016-09-04 NOTE — Progress Notes (Signed)
Pt awake, alert, up on unit today. Hyperverbal, inappropriately interacts with staff/peers, intrusive, and bothersome to peers. Pt asks everyone "where can I get some medical marijuana? That's all I need." Noted to be having other pts ask for soft drinks for him at lunch, then takes them from the pts. Reports good sleep last night, good appetite, high energy level, and good concentration. Rates depression, anxiety, and hopelessness 0/10 (low 0-10 high). Denies SI/HI/AVH. When questioned regarding reason for admission, pt states "ask my mama, ask my grandma. They know." Medication compliant. Attends group, but interrupts group sessions, actions cause other pts to be bothered.  Support and encouragement provided with use of therapeutic communication. Medications administered as ordered with education. Safety maintained with every 15 minute checks. Will continue to monitor.

## 2016-09-05 NOTE — Plan of Care (Signed)
Problem: Activity: Goal: Interest or engagement in activities will improve Outcome: Progressing Therapy groups encouraged.

## 2016-09-05 NOTE — Progress Notes (Signed)
Patient's mother phones with security code for plan of care update. Update provided. Mother Zella Ball(Robin) requests phone call from social worker and doctor at [phone]. Patient phoned mother to tell her he was "itchy after being outside". Prn Vistaril administered. Patient visiting with grandfather with no more complaints. Patient sullen at this time. Not speaking with Clinical research associatewriter and returning to phone mother.

## 2016-09-05 NOTE — Progress Notes (Signed)
[facility]  09/05/2016 2:40 PM Leonard Davidson  MRN:  [id number]  Subjective:  Mr. Leonard Davidson has a history of psychosis, possibly bipolar or schizoaffective disorder with disorganized, manic, sexualized behavior. He was restarted on Risperdal and received first Invega sustenna injection on 09/03/2016. Next dose on Saturday. I also started Depakote for mood stabilization. VPA leve on Saturday.   Today, Mr. Leonard Davidson is still very psychotic, disorganized, intrusive, and unreasonable.In addition, he has been sexually inappropriate trying to  Touch male peers and staff, gesturing inappropriately. He comes to my office on multiple occasions to ask about medicinal marijuana. He is not allowed to participate in classes as he is disruptive and disrespectful. He accepts Depakote and tolerates it well.   Principal Problem: Bipolar disorder, current episode manic severe with psychotic features [facility](HCC) Diagnosis:   Patient Active Problem List   Diagnosis Date Noted  . Noncompliance [Z91.19] 09/02/2016  . Cannabis use disorder, severe, dependence (HCC) [F12.20] 08/16/2016  . Bipolar disorder, current episode manic severe with psychotic features (HCC) [F31.2] 08/16/2016   Total Time spent with patient: 20 minutes  Past Psychiatric History: Schizophrenia.  Past Medical History:  Past Medical History:  Diagnosis Date  . Bipolar 1 disorder (HCC)    History reviewed. No pertinent surgical history. Family History: History reviewed. No pertinent family history. Family Psychiatric  History: See H&P. Social History:  History  Alcohol Use No     History  Drug Use  . Types: Marijuana    Social History   Social History  . Marital status: Single    Spouse name: N/A  . Number of children: N/A  . Years of education: N/A   Social History Main Topics  . Smoking status: Never Smoker  . Smokeless tobacco: Never Used  . Alcohol use No  . Drug use:     Types: Marijuana  . Sexual  activity: Not Asked   Other Topics Concern  . None   Social History Narrative  . None   Additional Social History:                         Sleep: Fair  Appetite:  Fair  Current Medications: Current Facility-Administered Medications  Medication Dose Route Frequency Provider Last Rate Last Dose  . acetaminophen (TYLENOL) tablet 650 mg  650 mg Oral Q6H PRN [name], MD   650 mg at 09/05/16 0508  . alum & mag hydroxide-simeth (MAALOX/MYLANTA) 200-200-20 MG/5ML suspension 30 mL  30 mL Oral Q4H PRN [name], MD      . diphenhydrAMINE (BENADRYL) capsule 25 mg  25 mg Oral BID [name], MD   25 mg at 09/05/16 0849  . divalproex (DEPAKOTE) DR tablet 500 mg  500 mg Oral Q8H Marti Mclane B Aletha Allebach, MD   500 mg at 09/05/16 1429  . hydrOXYzine (ATARAX/VISTARIL) tablet 25 mg  25 mg Oral TID PRN [name], MD   25 mg at 09/05/16 0508  . magnesium hydroxide (MILK OF MAGNESIA) suspension 30 mL  30 mL Oral Daily PRN [name], MD      . [name][START ON 09/07/2016] paliperidone (INVEGA SUSTENNA) injection 156 mg  156 mg Intramuscular Once Almedia Cordell B Westen Dinino, MD      . risperiDONE (RISPERDAL) tablet 2 mg  2 mg Oral BID Shari ProwsJolanta B Tinna Kolker, MD   2 mg at 09/05/16 0849  . traZODone (DESYREL) tablet 100 mg  100 mg Oral QHS PRN Jonny RuizJohn  T Clapacs, MD   100 mg at 09/03/16 2159    Lab Results:  No results found for this or any previous visit (from the past 48 hour(s)).  Blood Alcohol level:  Lab Results  Component Value Date   [facility] <5 09/02/2016   ETH <5 08/14/2016    Metabolic Disorder Labs: Lab Results  Component Value Date   HGBA1C 5.3 08/17/2016   MPG 105 08/17/2016   No results found for: PROLACTIN Lab Results  Component Value Date   CHOL 119 08/17/2016   TRIG 55 08/17/2016   HDL 32 (L) 08/17/2016   CHOLHDL 3.7 08/17/2016   VLDL 11 08/17/2016   LDLCALC 76 08/17/2016    Physical Findings: AIMS:  , ,  ,  ,    CIWA:    COWS:     Musculoskeletal: Strength  & Muscle Tone: within normal limits Gait & Station: normal Patient leans: N/A  Psychiatric Specialty Exam: Physical Exam  Nursing note and vitals reviewed.   Review of Systems  Psychiatric/Behavioral: Positive for hallucinations.  All other systems reviewed and are negative.   Blood pressure 107/70, pulse 84, temperature 98.6 F (37 C), temperature source Oral, resp. rate 18, height 6\' 2"  (1.88 m), weight 63.5 kg (140 lb), SpO2 100 %.Body mass index is 17.98 kg/m.  General Appearance: Fairly Groomed  Eye Contact:  Good  Speech:  Clear and Coherent  Volume:  Normal  Mood:  Euphoric  Affect:  Congruent  Thought Process:  Disorganized and Descriptions of Associations: Loose  Orientation:  Full (Time, Place, and Person)  Thought Content:  Delusions and Paranoid Ideation  Suicidal Thoughts:  No  Homicidal Thoughts:  No  Memory:  Immediate;   Fair Recent;   Fair Remote;   Fair  Judgement:  Poor  Insight:  Lacking  Psychomotor Activity:  Increased  Concentration:  Concentration: Fair and Attention Span: Fair  Recall:  [employer] of Knowledge:  Fair  Language:  Fair  Akathisia:  No  Handed:  Left  AIMS (if indicated):     Assets:  Communication Skills Desire for Improvement Housing Physical Health Resilience Social Support  ADL's:  Intact  Cognition:  WNL  Sleep:  Number of Hours: 6     Treatment Plan Summary: Daily contact with patient to assess and evaluate symptoms and progress in treatment and Medication management   Mr. Leonard Davidson is a 20 year old male with history of bipolar disorder admitted for agitation and threatening behavior at the house in the context of medication noncompliance.  1. Agitation. This has resolved. The patient is no longer allow to participate in programming due to disruptive, inappropriate behavior.  2. Suicidal and homicidal ideation. The patient is able to contract for safety in the hospital.  3. Mood and psychosis. He was restarted  on Risperdal. He was given injection of Invega Sustenna 234 mg on 10/31. Next injection on 11/4. We started Depakote for mood stabilization. VPA on 09/07/2016.   4. Insomnia. Trazodone is available.  5. Anxiety. Vistaril is available.  6. Metabolic syndromes screening. This was completed during his recent hospitalization.  7. Substance abuse. The patient was positive for marijuana and supposedly drinks some. There were no symptoms of alcohol withdrawal. The patient minimizes his problems and declines treatment.  8. Disposition. He will be discharged home with his grandparents. He will follow up with his regular psychiatrist at [facility]. We will attempt to provide ACT team but the patient has private insurance.  Kristine LineaJolanta Nickoli Bagheri, MD 09/05/2016, 2:40 PM

## 2016-09-05 NOTE — Tx Team (Signed)
Interdisciplinary Treatment and Diagnostic Plan Update  09/05/2016 Time of Session: 10:30am Leonard FuchsWilliam J Davidson MRN: [id number]  Principal Diagnosis: Bipolar disorder, current episode manic severe with psychotic features [name](HCC)  Secondary Diagnoses: Principal Problem:   Bipolar disorder, current episode manic severe with psychotic features (HCC) Active Problems:   Cannabis use disorder, severe, dependence (HCC)   Noncompliance   Current Medications:  Current Facility-Administered Medications  Medication Dose Route Frequency Provider Last Rate Last Dose  . acetaminophen (TYLENOL) tablet 650 mg  650 mg Oral Q6H PRN Leonard AmelJohn T Clapacs, MD   650 mg at 09/05/16 0508  . alum & mag hydroxide-simeth (MAALOX/MYLANTA) 200-200-20 MG/5ML suspension 30 mL  30 mL Oral Q4H PRN Leonard AmelJohn T Clapacs, MD      . diphenhydrAMINE (BENADRYL) capsule 25 mg  25 mg Oral BID Leonard AmelJohn T Clapacs, MD   25 mg at 09/05/16 0849  . divalproex (DEPAKOTE) DR tablet 500 mg  500 mg Oral Q8H Leonard B Pucilowska, MD   500 mg at 09/05/16 0512  . hydrOXYzine (ATARAX/VISTARIL) tablet 25 mg  25 mg Oral TID PRN Leonard AmelJohn T Clapacs, MD   25 mg at 09/05/16 0508  . magnesium hydroxide (MILK OF MAGNESIA) suspension 30 mL  30 mL Oral Daily PRN Leonard AmelJohn T Clapacs, MD      . Leonard Davidson[START ON 09/07/2016] paliperidone (INVEGA SUSTENNA) injection 156 mg  156 mg Intramuscular Once Leonard B Pucilowska, MD      . risperiDONE (RISPERDAL) tablet 2 mg  2 mg Oral BID Leonard ProwsJolanta B Pucilowska, MD   2 mg at 09/05/16 0849  . traZODone (DESYREL) tablet 100 mg  100 mg Oral QHS PRN Leonard AmelJohn T Clapacs, MD   100 mg at 09/03/16 2159   PTA Medications: Prescriptions Prior to Admission  Medication Sig Dispense Refill Last Dose  . BANOPHEN 25 MG capsule Take 1 capsule by mouth 2 (two) times daily.    unknown at unknown  . risperiDONE (RISPERDAL) 2 MG tablet Take 1 tablet (2 mg total) by mouth 2 (two) times daily. Please take 1 tab in am and 2 tabs at bedtime 90 tablet 0 unknown at unknown     Patient Stressors: Marital or family conflict  Patient Strengths: Active sense of humor Average or above average intelligence Physical Health  Treatment Modalities: Medication Management, Group therapy, Case management,  1 to 1 session with clinician, Psychoeducation, Recreational therapy.   Physician Treatment Plan for Primary Diagnosis: Bipolar disorder, current episode manic severe with psychotic features (HCC) Long Term Goal(s): Improvement in symptoms so as ready for discharge Improvement in symptoms so as ready for discharge   Short Term Goals: Ability to identify changes in lifestyle to reduce recurrence of condition will improve Ability to verbalize feelings will improve Ability to disclose and discuss suicidal ideas Ability to demonstrate self-control will improve Ability to identify and develop effective coping behaviors will improve Ability to maintain clinical measurements within normal limits will improve Compliance with prescribed medications will improve Ability to identify changes in lifestyle to reduce recurrence of condition will improve Ability to demonstrate self-control will improve Ability to identify triggers associated with substance abuse/mental health issues will improve  Medication Management: Evaluate patient's response, side effects, and tolerance of medication regimen.  Therapeutic Interventions: 1 to 1 sessions, Unit Group sessions and Medication administration.  Evaluation of Outcomes: Progressing  Physician Treatment Plan for Secondary Diagnosis: Principal Problem:   Bipolar disorder, current episode manic severe with psychotic features (HCC) Active Problems:   Cannabis use disorder, severe,  dependence (HCC)   Noncompliance  Long Term Goal(s): Improvement in symptoms so as ready for discharge Improvement in symptoms so as ready for discharge   Short Term Goals: Ability to identify changes in lifestyle to reduce recurrence of condition  will improve Ability to verbalize feelings will improve Ability to disclose and discuss suicidal ideas Ability to demonstrate self-control will improve Ability to identify and develop effective coping behaviors will improve Ability to maintain clinical measurements within normal limits will improve Compliance with prescribed medications will improve Ability to identify changes in lifestyle to reduce recurrence of condition will improve Ability to demonstrate self-control will improve Ability to identify triggers associated with substance abuse/mental health issues will improve     Medication Management: Evaluate patient's response, side effects, and tolerance of medication regimen.  Therapeutic Interventions: 1 to 1 sessions, Unit Group sessions and Medication administration.  Evaluation of Outcomes: Progressing   RN Treatment Plan for Primary Diagnosis: Bipolar disorder, current episode manic severe with psychotic features (HCC) Long Term Goal(s): Knowledge of disease and therapeutic regimen to maintain health will improve  Short Term Goals: Ability to verbalize feelings will improve and Compliance with prescribed medications will improve  Medication Management: RN will administer medications as ordered by provider, will assess and evaluate patient's response and provide education to patient for prescribed medication. RN will report any adverse and/or side effects to prescribing provider.  Therapeutic Interventions: 1 on 1 counseling sessions, Psychoeducation, Medication administration, Evaluate responses to treatment, Monitor vital signs and CBGs as ordered, Perform/monitor CIWA, COWS, AIMS and Fall Risk screenings as ordered, Perform wound care treatments as ordered.  Evaluation of Outcomes: Progressing   LCSW Treatment Plan for Primary Diagnosis: Bipolar disorder, current episode manic severe with psychotic features (HCC) Long Term Goal(s): Safe transition to appropriate next level  of care at discharge, Engage patient in therapeutic group addressing interpersonal concerns.  Short Term Goals: Engage patient in aftercare planning with referrals and resources, Increase social support, Increase ability to appropriately verbalize feelings, Increase emotional regulation, Facilitate acceptance of mental health diagnosis and concerns, Facilitate patient progression through stages of change regarding substance use diagnoses and concerns, Identify triggers associated with mental health/substance abuse issues and Increase skills for wellness and recovery  Therapeutic Interventions: Assess for all discharge needs, 1 to 1 time with Social worker, Explore available resources and support systems, Assess for adequacy in community support network, Educate family and significant other(s) on suicide prevention, Complete Psychosocial Assessment, Interpersonal group therapy.  Evaluation of Outcomes: Progressing   Progress in Treatment: Attending groups: No. CSW still assessing, pt new to milieu Participating in groups: No. CSW still assessing, pt new to milieu Taking medication as prescribed: Yes. Toleration medication: Yes. Family/Significant other contact made: No, will contact:  CSW still assessing for contacts Patient understands diagnosis: Yes. Discussing patient identified problems/goals with staff: Yes. Medical problems stabilized or resolved: Yes Denies suicidal/homicidal ideation: Yes. Issues/concerns per patient self-inventory: No. Other: none reported  New problem(s) identified: No, Describe:  none reported  New Short Term/Long Term Goal(s):  Discharge Plan or Barriers:   Reason for Continuation of Hospitalization: Aggression, anxiety, delusions  Estimated Length of Stay: 3-5 days  Attendees: Patient:  09/05/2016 10:50 AM  Physician: Dr. Jennet MaduroPucilowska, MD 09/05/2016 10:50 AM  Nursing: Shelia MediaJanet Jones, RN 09/05/2016 10:50 AM  RN Care Manager: 09/05/2016 10:50 AM  Social Worker:  Dorothe PeaJonathan F. Rollins Wrightson, LCSWA, LCAS 09/05/2016 10:50 AM  Recreational Therapist: Hershal CoriaBeth Greene, LRT 09/05/2016 10:50 AM  Other:  09/05/2016 10:50 AM  Other:  09/05/2016 10:50 AM  Other: 09/05/2016 10:50 AM    Scribe for Treatment Team: Dorothe Pea Vennie Waymire, LCSWA

## 2016-09-05 NOTE — [facility] Group Notes (Signed)
[facility] LCSW Group Therapy Note  Type of Therapy and Topic:  Group Therapy:  Goals Group: SMART Goals  Participation Level:  None, Patient was disruptive during group and was unable to be redirected. Patient entered and left the group three times and was focused on being prescribed medical marijuana.   Description of Group:   The purpose of a daily goals group is to assist and guide patients in setting recovery/wellness-related goals.  The objective is to set goals as they relate to the crisis in which they were admitted. Patients will be using SMART goal modalities to set measurable goals.  Characteristics of realistic goals will be discussed and patients will be assisted in setting and processing how one will reach their goal. Facilitator will also assist patients in applying interventions and coping skills learned in psycho-education groups to the SMART goal and process how one will achieve defined goal.  Therapeutic Goals: -Patients will develop and document one goal related to or their crisis in which brought them into treatment. -Patients will be guided by LCSW using SMART goal setting modality in how to set a measurable, attainable, realistic and time sensitive goal.  -Patients will process barriers in reaching goal. -Patients will process interventions in how to overcome and successful in reaching goal.   Summary of Patient Progress:  Patient Goal: "To get my Corrie DandyMary Jane/ Medical Marijuana before I leave".   Therapeutic Modalities:   Motivational Interviewing Engineer, manufacturing systemsCognitive Behavioral Therapy Crisis Intervention Model SMART goals setting  Griffin Dewilde G. Garnette CzechSampson MSW, [facility] Hospital DistrictCSWA 09/05/2016 10:17 AM

## 2016-09-05 NOTE — Progress Notes (Signed)
Patient with bright affect, cooperative behavior with meals, meds and plan of care. No SI/HI/AVH at this time. Patient is social with select male peer. Poor insight, no mention of motivation. Patient States his goal today is to  to get a perscription for medical marijuana. Patient silly and paces around nurse's station and will repeatedly request this when he sees Clinical research associatewriter. Therapy groups encouraged. Safety maintained.

## 2016-09-05 NOTE — [facility] Group Notes (Signed)
[facility] Group Notes:  (Nursing/MHT/Case Management/Adjunct)  Date:  09/05/2016  Time:  10:13 PM  Type of Therapy:  Psychoeducational Skills  Participation Level:  Did Not Attend  Participation Quality:   Summary of Progress/Problems:  Mayra NeerJackie L Sahory Nordling 09/05/2016, 10:13 PM

## 2016-09-05 NOTE — [facility] Group Notes (Signed)
[facility] LCSW Group Therapy  09/05/2016 2:02 PM  Type of Therapy:  Group Therapy  Participation Level:  Minimal  Participation Quality:  Drowsy  Affect:  Lethargic  Cognitive:  Lacking  Insight:  None  Engagement in Therapy:  None  Modes of Intervention:  Activity, Discussion, Education and Support  Summary of Progress/Problems:Emotional Regulation: Patients will identify both negative and positive emotions. They will discuss emotions they have difficulty regulating and how they impact their lives. Patients will be asked to identify healthy coping skills to combat unhealthy reactions to negative emotions. Patient did not participate in the group discussion and left after about 10 minutes and did not return to group.    Harald Quevedo G. Garnette CzechSampson MSW, LCSWA 09/05/2016, 2:05 PM

## 2016-09-05 NOTE — Progress Notes (Signed)
Frequent to the nurses station many times, Tylenol 650 mg and Vistaril 25 mg, inconsiderate of others, farting, obnoxious this morning, poor insight, unable to understand simple concepts.

## 2016-09-06 LAB — VALPROIC ACID LEVEL: VALPROIC ACID LVL: 99 ug/mL (ref 50.0–100.0)

## 2016-09-06 MED ORDER — OLANZAPINE 5 MG PO TBDP
10.0000 mg | ORAL_TABLET | Freq: Three times a day (TID) | ORAL | Status: DC | PRN
Start: 2016-09-06 — End: 2016-09-09

## 2016-09-06 MED ORDER — SIMETHICONE 80 MG PO CHEW
160.0000 mg | CHEWABLE_TABLET | Freq: Four times a day (QID) | ORAL | Status: DC | PRN
  Administered 2016-09-06 – 2016-09-08 (×4): 160 mg via ORAL
  Filled 2016-09-06 (×6): qty 2

## 2016-09-06 NOTE — Progress Notes (Signed)
Patient on 1:1 for sexual aggression with sitter and security. Patient currently in bed resting with eyes closed. Patient able to be awaken to take medications.  Will continue to monitor.

## 2016-09-06 NOTE — Progress Notes (Signed)
Patient in room with sitter and security 1:1 for sexual aggression. Patient is resting in bed with eyes closed. No sexually aggressive behaviors noted. Will continue to monitor.

## 2016-09-06 NOTE — Progress Notes (Signed)
Garment/textile technologistecurity sitter and MHT 1:1 with patient. Safety maintained. Will continue to monitor.

## 2016-09-06 NOTE — [facility] Group Notes (Signed)
[facility] LCSW Group Therapy  09/06/2016 1:52 PM  Type of Therapy:  Group Therapy  Participation Level:  None. Patient came to group but left out after about 5 minutes and did not return.   Summary of Progress/Problems:Feelings around Relapse. Group members discussed the meaning of relapse and shared personal stories of relapse, how it affected them and others, and how they perceived themselves during this time. Group members were encouraged to identify triggers, warning signs and coping skills used when facing the possibility of relapse. Social supports were discussed and explored in detail. Patients also discussed facing disappointment and how that can trigger someone to relapse.   Skiler Olden G. Garnette CzechSampson MSW, LCSWA 09/06/2016, 1:52 PM

## 2016-09-06 NOTE — Progress Notes (Signed)
9:30 PM       Patient on 1:1 for sexual aggression with sitter and security. Patient currently in bed resting with eyes closed. Patient able to be awaken to take medications.  Will continue to monitor.

## 2016-09-06 NOTE — Progress Notes (Signed)
Pt in room with sitter and security 1:1 for sexual aggression. Pt cooperative at this time with no sexually aggressive behaviors noted. Will continue to monitor.

## 2016-09-06 NOTE — Progress Notes (Signed)
Pt in room with sitter and security 1:1 for sexual aggression. Pt cooperative at this time with no sexually aggressive behaviors noted. Will continue to monitor. 

## 2016-09-06 NOTE — Progress Notes (Signed)
D: Patient alert and oriented x 3. Patient is a 1:1 with sitter and security for sexual aggression. Patient is currently walking in hallway. Patient denies pain/Si/HI/AVH. Will continue to monitor.  A: Staff to monitor 1:1 mins for safety. Encouragement and support offered. Scheduled medications administered per orders. R: Patient remains safe on the unit. Patient attended group tonight. Patient visible on the unit. Patient taking administered medications.

## 2016-09-06 NOTE — Progress Notes (Signed)
Pt in room sitting on bed. Security and sitter present.

## 2016-09-06 NOTE — Progress Notes (Signed)
Recreation Therapy Notes  Date: 11.03.17 Time: 9:30 am Location: Craft Room  Group Topic: Self-expression/Coping Skills  Goal Area(s) Addresses:  Patient will effectively use art as a means of self-expression. Patient will recognize positive benefit of self-expression. Patient will be able to identify one emption experienced during group session. Patient will identify use of art as a coping skill.  Behavioral Response: Attentive, Left early  Intervention: Two Faces of Me  Activity: Patients were given a blank face worksheet and were instructed to draw a line down the middle. On one side of the worksheet they were instructed to draw or write how they felt when they were admitted to the hospital. On the other side, they were instructed to draw or write how they want to feel when they are d/c.  Education: LRT educated patients on healthy coping skills.  Education Outcome: Patient left before LRT educated group.  Clinical Observations/Feedback: Patient arrived to group at approximately 9:50 am. LRT explained activity. Patient completed activity by writing how he felt when he was admitted and how he wants to feel when he is d/c. Patient left group at 9:51 am. Patient returned to group at approximately 9:53 am and asked about snack. Patient left group at approximately 9:55 am stating to let him know when snack was.  Jacquelynn CreeGreene,Aliyah Abeyta M, LRT/CTRS 09/06/2016 10:19 AM

## 2016-09-06 NOTE — Progress Notes (Addendum)
[facility] MD Progress Note  09/06/2016 5:09 PM Leonard Davidson  MRN:  [id number]  Subjective:  Leonard Davidson has a history of psychosis, possibly bipolar or schizoaffective disorder with disorganized, manic, sexualized behavior. He was restarted on Risperdal and received first Invega sustenna injection on 09/03/2016. Next dose on Saturday. I also started Depakote for mood stabilization. VPA 99. I added Zyprexa to his regimen today.  Today, Leonard Davidson is still very psychotic, disorganized, intrusive, and unreasonable. In addition, he has been sexually inappropriate trying to touch male peers and staff, gesturing inappropriately. He also gets into males persona space creating conflict. He has security and 1:1 sitter to follow him around. He is not allow to participate in programming as he is disrespectful and disruptive to the milieu.  He accepts medications as evidenced by good VPA level and tolerates them well. He met with treatment team today to discuss his behavior and steps taken to assure safety on the unit.  Principal Problem: Bipolar disorder, current episode manic severe with psychotic features [facility]) Diagnosis:   Patient Active Problem List   Diagnosis Date Noted  . Noncompliance [Z91.19] 09/02/2016  . Cannabis use disorder, severe, dependence ([place]) [F12.20] 08/16/2016  . Bipolar disorder, current episode manic severe with psychotic features ([place]) [F31.2] 08/16/2016   Total Time spent with patient: 20 minutes  Past Psychiatric History: Schizophrenia.  Past Medical History:  Past Medical History:  Diagnosis Date  . Bipolar 1 disorder ([place])    History reviewed. No pertinent surgical history. Family History: History reviewed. No pertinent family history. Family Psychiatric  History: See H&P. Social History:  History  Alcohol Use No     History  Drug Use  . Types: Marijuana    Social History   Social History  . Marital status: Single    Spouse name: N/A  . Number of  children: N/A  . Years of education: N/A   Social History Main Topics  . Smoking status: Never Smoker  . Smokeless tobacco: Never Used  . Alcohol use No  . Drug use:     Types: Marijuana  . Sexual activity: Not Asked   Other Topics Concern  . None   Social History Narrative  . None   Additional Social History:                         Sleep: Fair  Appetite:  Fair  Current Medications: Current Facility-Administered Medications  Medication Dose Route Frequency Provider Last Rate Last Dose  . acetaminophen (TYLENOL) tablet 650 mg  650 mg Oral Q6H PRN Gonzella Lex, MD   650 mg at 09/06/16 0759  . alum & mag hydroxide-simeth (MAALOX/MYLANTA) 200-200-20 MG/5ML suspension 30 mL  30 mL Oral Q4H PRN Gonzella Lex, MD   30 mL at 09/06/16 1421  . diphenhydrAMINE (BENADRYL) capsule 25 mg  25 mg Oral BID Gonzella Lex, MD   25 mg at 09/06/16 0755  . divalproex (DEPAKOTE) DR tablet 500 mg  500 mg Oral Q8H [name], MD   500 mg at 09/06/16 1420  . hydrOXYzine (ATARAX/VISTARIL) tablet 25 mg  25 mg Oral TID PRN Gonzella Lex, MD   25 mg at 09/06/16 1519  . magnesium hydroxide (MILK OF MAGNESIA) suspension 30 mL  30 mL Oral Daily PRN Gonzella Lex, MD   30 mL at 09/06/16 1421  . OLANZapine zydis (ZYPREXA) disintegrating tablet 10 mg  10 mg Oral TID PRN Jolanta B  Pucilowska, MD      . Derrill Memo ON 09/07/2016] paliperidone (INVEGA SUSTENNA) injection 156 mg  156 mg Intramuscular Once [name], MD      . risperiDONE (RISPERDAL) tablet 2 mg  2 mg Oral BID Clovis Fredrickson, MD   2 mg at 09/06/16 0755  . simethicone (MYLICON) chewable tablet 160 mg  160 mg Oral QID PRN Clovis Fredrickson, MD   160 mg at 09/06/16 1518  . traZODone (DESYREL) tablet 100 mg  100 mg Oral QHS PRN Gonzella Lex, MD   100 mg at 09/03/16 2159    Lab Results:  Results for orders placed or performed during the hospital encounter of 09/02/16 (from the past 48 hour(s))  Valproic acid  level     Status: None   Collection Time: 09/06/16  7:37 AM  Result Value Ref Range   Valproic Acid Lvl 99 50.0 - 100.0 ug/mL    Blood Alcohol level:  Lab Results  Component Value Date   ETH <5 09/02/2016   ETH <5 41/96/2229    Metabolic Disorder Labs: Lab Results  Component Value Date   HGBA1C 5.3 08/17/2016   MPG 105 08/17/2016   No results found for: PROLACTIN Lab Results  Component Value Date   CHOL 119 08/17/2016   TRIG 55 08/17/2016   HDL 32 (L) 08/17/2016   CHOLHDL 3.7 08/17/2016   VLDL 11 08/17/2016   LDLCALC 76 08/17/2016    Physical Findings: AIMS:  , ,  ,  ,    CIWA:    COWS:     Musculoskeletal: Strength & Muscle Tone: within normal limits Gait & Station: normal Patient leans: N/A  Psychiatric Specialty Exam: Physical Exam  Nursing note and vitals reviewed.   Review of Systems  Psychiatric/Behavioral: Positive for hallucinations.  All other systems reviewed and are negative.   Blood pressure (!) 120/102, pulse 79, temperature 98.2 F (36.8 C), temperature source Oral, resp. rate 20, height 6' 2"  (1.88 m), weight 63.5 kg (140 lb), SpO2 100 %.Body mass index is 17.98 kg/m.  General Appearance: Fairly Groomed  Eye Contact:  Good  Speech:  Clear and Coherent  Volume:  Normal  Mood:  Euphoric  Affect:  Congruent  Thought Process:  Disorganized and Descriptions of Associations: Loose  Orientation:  Full (Time, Place, and Person)  Thought Content:  Delusions and Paranoid Ideation  Suicidal Thoughts:  No  Homicidal Thoughts:  No  Memory:  Immediate;   Fair Recent;   Fair Remote;   Fair  Judgement:  Poor  Insight:  Lacking  Psychomotor Activity:  Increased  Concentration:  Concentration: Fair and Attention Span: Fair  Recall:  [employer] of Knowledge:  Fair  Language:  Fair  Akathisia:  No  Handed:  Left  AIMS (if indicated):     Assets:  Communication Skills Desire for Improvement Housing Physical Health Resilience Social Support   ADL's:  Intact  Cognition:  WNL  Sleep:  Number of Hours: 7.25     Treatment Plan Summary: Daily contact with patient to assess and evaluate symptoms and progress in treatment and Medication management   Leonard Davidson is a 20 year old male with history of bipolar disorder admitted for agitation and threatening behavior at the house in the context of medication noncompliance.  1. Hypersexual. Security and 1:1 sitter. The patient is no longer allow to participate in programming due to disruptive, inappropriate behavior.  2. Suicidal and homicidal ideation. The patient is able  to contract for safety in the hospital.  3. Mood and psychosis. He was restarted on Risperdal. He was given injection of Invega Sustenna 234 mg on 10/31. Next injection on 11/4. We started Depakote for mood  VPA 99. We added Zyprexa 10 mg nightly for further mood stabilization.   4. Insomnia. Trazodone is available.  5. Anxiety. Vistaril is available.  6. Metabolic syndromes screening. This was completed during his recent hospitalization.  7. Substance abuse. The patient was positive for marijuana and supposedly drinks some. There were no symptoms of alcohol withdrawal. The patient minimizes his problems and declines treatment.  8. Disposition. He will be discharged home with his grandparents. He will follow up with his regular psychiatrist at [facility]. We will attempt to provide ACT team but the patient has private insurance.    Orson Slick, MD 09/06/2016, 5:09 PM

## 2016-09-06 NOTE — Progress Notes (Signed)
D: Pt spends most of this evening in his room sleeping. Upon approach, he appears sullen and does not forward much information to Clinical research associatewriter. Pt gets up for medications and snack then returns back to his room. He reports that his goal today was "to get out." Denies SI/HI/AVH at this time. A: Emotional support and encouragement provided. Medications administered with education. q15 minute safety checks maintained. R: Pt remains free from harm. Will continue to monitor.

## 2016-09-06 NOTE — Plan of Care (Signed)
Problem: Safety: Goal: Ability to demonstrate self-control will improve Outcome: Not Progressing Pt now 1:1 with MHT and security sitter for sexually inappropriateness. Continues to be intrusive, inappropriate with interactions.

## 2016-09-06 NOTE — Progress Notes (Signed)
Pt hyperverbal, intrusive, and has repeated requests regarding "having gas" and going outside for group. Appears blank, silly, and continues to be 1:1 for sexually inappropriateness. Focused on discharging. Medication complaint to include PRN medications for gas. Denies SI/HI/AVH, but does not answer many questions regarding his reasons for being here at [facility], always responds "talk to my mom about, I'm here because of her."  Support and encouragement provided with use of therapeutic communication. Medications administered as ordered with education. Safety maintained with every 15 minute checks and MHT and security sitter 1:1. Will continue to monitor.

## 2016-09-06 NOTE — Progress Notes (Signed)
Security aware of security sitter 1: order. Awaiting security sitter to come. Will continue to monitor.

## 2016-09-06 NOTE — Plan of Care (Signed)
Problem: Activity: Goal: Sleeping patterns will improve Outcome: Progressing Pt in room sleeping throughout most of the evening. Awakens upon approach.  Problem: Safety: Goal: Periods of time without injury will increase Outcome: Progressing Pt remains free from harm.

## 2016-09-06 NOTE — Progress Notes (Signed)
Order acknowledged for sitter and security 1:1 for allegations of sexually aggression. MHTs report pt has been sexually inappropriate, touching inappropriately, making sexually inappropriate comments to other pts and MHTs. Security and MHTs informed.   Pt currently denies being sexually inappropriate, but states "it's hard with all these women around." Pt aware that he will be 1:1 with MHT and security sitter. Cooperative at this time. Safety maintained. Will continue to monitor.

## 2016-09-07 LAB — AMMONIA: AMMONIA: 31 umol/L (ref 9–35)

## 2016-09-07 NOTE — Progress Notes (Signed)
Pt in bed awake.

## 2016-09-07 NOTE — Progress Notes (Signed)
Pt sleeping. 

## 2016-09-07 NOTE — Progress Notes (Addendum)
[facility]  09/07/2016 10:57 AM Leonard Davidson  MRN:  [id number]  Subjective:  Leonard Davidson has a history of psychosis, possibly bipolar or schizoaffective disorder with disorganized, manic, sexualized behavior. He was restarted on Risperdal and received first Invega sustenna injection on 09/03/2016. Next dose on Saturday. I also started Depakote for mood stabilization. VPA 99. I added Zyprexa to his regimen today.  Follow-up for Saturday the fourth. Patient has no new complaints. His affect continues to be odd. Smiles broadly. Doesn't communicate very much. He has not been aggressive today and not been disruptive but he continues to have close observation from security staff and nursing staff. Compliant with medicine. No physical complaints. Only request is for discharge. Vital signs stable except for a low pulse. No complaints of dizziness. Principal Problem: Bipolar disorder, current episode manic severe with psychotic features [facility](HCC) Diagnosis:   Patient Active Problem List   Diagnosis Date Noted  . Noncompliance [Z91.19] 09/02/2016  . Cannabis use disorder, severe, dependence (HCC) [F12.20] 08/16/2016  . Bipolar disorder, current episode manic severe with psychotic features (HCC) [F31.2] 08/16/2016   Total Time spent with patient: 20 minutes  Past Psychiatric History: Schizophrenia.  Past Medical History:  Past Medical History:  Diagnosis Date  . Bipolar 1 disorder (HCC)    History reviewed. No pertinent surgical history. Family History: History reviewed. No pertinent family history. Family Psychiatric  History: See H&P. Social History:  History  Alcohol Use No     History  Drug Use  . Types: Marijuana    Social History   Social History  . Marital status: Single    Spouse name: N/A  . Number of children: N/A  . Years of education: N/A   Social History Main Topics  . Smoking status: Never Smoker  . Smokeless tobacco: Never Used  . Alcohol use No  .  Drug use:     Types: Marijuana  . Sexual activity: Not Asked   Other Topics Concern  . None   Social History Narrative  . None   Additional Social History:                         Sleep: Fair  Appetite:  Fair  Current Medications: Current Facility-Administered Medications  Medication Dose Route Frequency Provider Last Rate Last Dose  . acetaminophen (TYLENOL) tablet 650 mg  650 mg Oral Q6H PRN Audery AmelJohn T Gemayel Mascio, MD   650 mg at 09/06/16 1838  . alum & mag hydroxide-simeth (MAALOX/MYLANTA) 200-200-20 MG/5ML suspension 30 mL  30 mL Oral Q4H PRN Audery AmelJohn T Lorann Tani, MD   30 mL at 09/06/16 1421  . diphenhydrAMINE (BENADRYL) capsule 25 mg  25 mg Oral BID Audery AmelJohn T Telina Kleckley, MD   25 mg at 09/07/16 0915  . divalproex (DEPAKOTE) DR tablet 500 mg  500 mg Oral Q8H [name], MD   500 mg at 09/07/16 [id number]  . hydrOXYzine (ATARAX/VISTARIL) tablet 25 mg  25 mg Oral TID PRN Audery AmelJohn T Lyvonne Cassell, MD   25 mg at 09/06/16 2158  . magnesium hydroxide (MILK OF MAGNESIA) suspension 30 mL  30 mL Oral Daily PRN Audery AmelJohn T Saquoia Sianez, MD   30 mL at 09/06/16 1421  . OLANZapine zydis (ZYPREXA) disintegrating tablet 10 mg  10 mg Oral TID PRN [name], MD      . risperiDONE (RISPERDAL) tablet 2 mg  2 mg Oral BID Shari ProwsJolanta B Pucilowska, MD   2 mg at 09/07/16 0915  .  simethicone (MYLICON) chewable tablet 160 mg  160 mg Oral QID PRN [name], MD   160 mg at 09/07/16 0656  . traZODone (DESYREL) tablet 100 mg  100 mg Oral QHS PRN Audery Amel, MD   100 mg at 09/06/16 2158    Lab Results:  Results for orders placed or performed during the hospital encounter of 09/02/16 (from the past 48 hour(s))  Valproic acid level     Status: None   Collection Time: 09/06/16  7:37 AM  Result Value Ref Range   Valproic Acid Lvl 99 50.0 - 100.0 ug/mL  Ammonia     Status: None   Collection Time: 09/07/16  6:37 AM  Result Value Ref Range   Ammonia 31 9 - 35 umol/L    Blood Alcohol level:  Lab Results   Component Value Date   ETH <5 09/02/2016   ETH <5 08/14/2016    Metabolic Disorder Labs: Lab Results  Component Value Date   HGBA1C 5.3 08/17/2016   MPG 105 08/17/2016   No results found for: PROLACTIN Lab Results  Component Value Date   CHOL 119 08/17/2016   TRIG 55 08/17/2016   HDL 32 (L) 08/17/2016   CHOLHDL 3.7 08/17/2016   VLDL 11 08/17/2016   LDLCALC 76 08/17/2016    Physical Findings: AIMS:  , ,  ,  ,    CIWA:    COWS:     Musculoskeletal: Strength & Muscle Tone: within normal limits Gait & Station: normal Patient leans: N/A  Psychiatric Specialty Exam: Physical Exam  Nursing note and vitals reviewed. Constitutional: He appears well-developed and well-nourished.  HENT:  Head: Normocephalic and atraumatic.  Eyes: Conjunctivae are normal. Pupils are equal, round, and reactive to light.  Neck: Normal range of motion.  Cardiovascular: Normal heart sounds.   Respiratory: Effort normal.  GI: Soft.  Musculoskeletal: Normal range of motion.  Neurological: He is alert.  Skin: Skin is warm and dry.  Psychiatric: He has a normal mood and affect. His speech is delayed. He is slowed. Cognition and memory are impaired. He expresses impulsivity. He expresses no suicidal ideation.    Review of Systems  Constitutional: Negative.   HENT: Negative.   Eyes: Negative.   Respiratory: Negative.   Cardiovascular: Negative.   Gastrointestinal: Negative.   Musculoskeletal: Negative.   Skin: Negative.   Neurological: Negative.   Psychiatric/Behavioral: Negative for hallucinations.  All other systems reviewed and are negative.   Blood pressure 133/72, pulse (!) 53, temperature 98 F (36.7 C), temperature source Oral, resp. rate 18, height 6\' 2"  (1.88 m), weight 63.5 kg (140 lb), SpO2 100 %.Body mass index is 17.98 kg/m.  General Appearance: Fairly Groomed  Eye Contact:  Good  Speech:  Clear and Coherent  Volume:  Normal  Mood:  Euphoric  Affect:  Congruent  Thought  Process:  Disorganized and Descriptions of Associations: Loose  Orientation:  Full (Time, Place, and Person)  Thought Content:  Delusions and Paranoid Ideation  Suicidal Thoughts:  No  Homicidal Thoughts:  No  Memory:  Immediate;   Fair Recent;   Fair Remote;   Fair  Judgement:  Poor  Insight:  Lacking  Psychomotor Activity:  Increased  Concentration:  Concentration: Fair and Attention Span: Fair  Recall:  [employer] of Knowledge:  Fair  Language:  Fair  Akathisia:  No  Handed:  Left  AIMS (if indicated):     Assets:  Communication Skills Desire for Improvement Housing Physical Health  Resilience Social Support  ADL's:  Intact  Cognition:  WNL  Sleep:  Number of Hours: 7.45     Treatment Plan Summary: Daily contact with patient to assess and evaluate symptoms and progress in treatment and Medication management   Leonard Davidson is a 20 year old male with history of bipolar disorder admitted for agitation and threatening behavior at the house in the context of medication noncompliance.  1. Hypersexual. Security and 1:1 sitter. The patient is no longer allow to participate in programming due to disruptive, inappropriate behavior.  2. Suicidal and homicidal ideation. The patient is able to contract for safety in the hospital.  3. Mood and psychosis. He was restarted on Risperdal. He was given injection of Invega Sustenna 234 mg on 10/31. Next injection on 11/4. We started Depakote for mood  VPA 99. We added Zyprexa 10 mg nightly for further mood stabilization.   4. Insomnia. Trazodone is available.  5. Anxiety. Vistaril is available.  6. Metabolic syndromes screening. This was completed during his recent hospitalization.  7. Substance abuse. The patient was positive for marijuana and supposedly drinks some. There were no symptoms of alcohol withdrawal. The patient minimizes his problems and declines treatment.  8. Disposition. He will be discharged home with his  grandparents. [facility]. We will attempt to provide ACT team but the patient has private insurance.  Follow-up Saturday the fourth. No change indicated to medication for today. Patient counseled about medication plan and treatment plan. Continue on close observation. Case reviewed with nursing. If behavior continues to improve could consider change in his observation status as time goes on. No other change for today.  Ammonia level was checked this morning and was 31. Well within the normal limits. No need to worry about this given the Depakote therapy at this time.   Mordecai RasmussenJohn Shanetta Nicolls, MD 09/07/2016, 10:57 AM

## 2016-09-07 NOTE — Plan of Care (Signed)
Problem: Coping: Goal: Ability to verbalize frustrations and anger appropriately will improve Outcome: Not Progressing Pt forwards little. Unwilling to participate

## 2016-09-07 NOTE — Progress Notes (Signed)
Pt cooperative with medications. Remains unwilling to participate in assessment. Disorganized. Odd behavior. Remains 1:1 Garment/textile technologistsecurity sitter for sexual aggression. No sexually inappropriate behavior observed this shift. Pt doesn't endorse SI/HI/AVH or pain. Safety maintained. Will continue to monitor.

## 2016-09-07 NOTE — Progress Notes (Signed)
Pt continues on close observation without incident. Pt out side playing basketball and got accidentally scratched  On the left side of his face. Scratch was about 1/4 inch long.

## 2016-09-07 NOTE — Progress Notes (Signed)
Pt has been pleasant and cooperative. Pt denies SI,HI and A/V hallucinations. Pt remains on close observations without incident. Pt's mood and affect has been depressed.

## 2016-09-07 NOTE — [facility] Group Notes (Signed)
[facility] LCSW Group Therapy  09/07/2016 2:26 PM  Type of Therapy:  Group Therapy  Participation Level:  Patient did not attend group. CSW invited patient to group.   Summary of Progress/Problems:Coping Skills: Patients defined and discussed healthy coping skills. Patients identified healthy coping skills they would like to try during hospitalization and after discharge. CSW offered insight to varying coping skills that may have been new to patients such as practicing mindfulness.  Leonard Davidson G. Garnette CzechSampson MSW, LCSWA 09/07/2016, 2:26 PM

## 2016-09-07 NOTE — Progress Notes (Signed)
Pt sleeping in bed. No distress noted.  

## 2016-09-07 NOTE — Progress Notes (Signed)
Patient in bed resting with eyes closed. No acts of sexual aggression during first of shift. Will continue to monitor.

## 2016-09-08 NOTE — Progress Notes (Signed)
Order D/C for pt to have 1;1 close observation. No inappropriate behaviors noted. Pt stated that he would "behave".

## 2016-09-08 NOTE — Procedures (Signed)
Pt remains on close observations without incident

## 2016-09-08 NOTE — Progress Notes (Signed)
Pt in bed sleeping. Sitter present. 

## 2016-09-08 NOTE — [facility] Group Notes (Signed)
[facility] Group Notes:  (Nursing/MHT/Case Management/Adjunct)  Date:  09/08/2016  Time:  4:01 AM  Type of Therapy:  Group Therapy  Participation Level:  Did Not Attend    Leonard Davidson 09/08/2016, 4:01 AM

## 2016-09-08 NOTE — Progress Notes (Signed)
Pt on close observations without incident. 

## 2016-09-08 NOTE — Progress Notes (Signed)
Pt needs frequent redirection for being intrusive and antagonizing other Pts.

## 2016-09-08 NOTE — Progress Notes (Signed)
Pt on close observations

## 2016-09-08 NOTE — Progress Notes (Signed)
Pt in dayroom. Minimal interaction. Sitter present

## 2016-09-08 NOTE — Progress Notes (Signed)
[facility] MD Progress Note  09/08/2016 1:43 PM ALYAS CREARY  MRN:  [id number]  Subjective:  Mr. Mccollam has a history of psychosis, possibly bipolar or schizoaffective disorder with disorganized, manic, sexualized behavior. He was restarted on Risperdal and received first Invega sustenna injection on 09/03/2016. Next dose on Saturday. I also started Depakote for mood stabilization. VPA 99. I added Zyprexa to his regimen today.  Follow-up for Sunday the fifth. No new complaints. A prescription reviewed with nursing. Patient's behavior has been much better. Not dangerous or aggressive. Cooperative for the most part. More appropriate than previously. Principal Problem: Bipolar disorder, current episode manic severe with psychotic features [facility]) Diagnosis:   Patient Active Problem List   Diagnosis Date Noted  . Noncompliance [Z91.19] 09/02/2016  . Cannabis use disorder, severe, dependence (HCC) [F12.20] 08/16/2016  . Bipolar disorder, current episode manic severe with psychotic features (HCC) [F31.2] 08/16/2016   Total Time spent with patient: 20 minutes  Past Psychiatric History: Schizophrenia.  Past Medical History:  Past Medical History:  Diagnosis Date  . Bipolar 1 disorder (HCC)    History reviewed. No pertinent surgical history. Family History: History reviewed. No pertinent family history. Family Psychiatric  History: See H&P. Social History:  History  Alcohol Use No     History  Drug Use  . Types: Marijuana    Social History   Social History  . Marital status: Single    Spouse name: N/A  . Number of children: N/A  . Years of education: N/A   Social History Main Topics  . Smoking status: Never Smoker  . Smokeless tobacco: Never Used  . Alcohol use No  . Drug use:     Types: Marijuana  . Sexual activity: Not Asked   Other Topics Concern  . None   Social History Narrative  . None   Additional Social History:                         Sleep:  Fair  Appetite:  Fair  Current Medications: Current Facility-Administered Medications  Medication Dose Route Frequency Provider Last Rate Last Dose  . acetaminophen (TYLENOL) tablet 650 mg  650 mg Oral Q6H PRN Audery Amel, MD   650 mg at 09/06/16 1838  . alum & mag hydroxide-simeth (MAALOX/MYLANTA) 200-200-20 MG/5ML suspension 30 mL  30 mL Oral Q4H PRN Audery Amel, MD   30 mL at 09/06/16 1421  . diphenhydrAMINE (BENADRYL) capsule 25 mg  25 mg Oral BID Audery Amel, MD   25 mg at 09/08/16 0739  . divalproex (DEPAKOTE) DR tablet 500 mg  500 mg Oral Q8H [name], MD   500 mg at 09/08/16 1314  . hydrOXYzine (ATARAX/VISTARIL) tablet 25 mg  25 mg Oral TID PRN Audery Amel, MD   25 mg at 09/08/16 7829  . magnesium hydroxide (MILK OF MAGNESIA) suspension 30 mL  30 mL Oral Daily PRN Audery Amel, MD   30 mL at 09/06/16 1421  . OLANZapine zydis (ZYPREXA) disintegrating tablet 10 mg  10 mg Oral TID PRN [name], MD      . risperiDONE (RISPERDAL) tablet 2 mg  2 mg Oral BID [name], MD   2 mg at 09/08/16 0739  . simethicone (MYLICON) chewable tablet 160 mg  160 mg Oral QID PRN [name], MD   160 mg at 09/08/16 0742  . traZODone (DESYREL) tablet 100 mg  100 mg Oral  QHS PRN Audery AmelJohn T Akia Desroches, MD   100 mg at 09/07/16 2132    Lab Results:  Results for orders placed or performed during the hospital encounter of 09/02/16 (from the past 48 hour(s))  Ammonia     Status: None   Collection Time: 09/07/16  6:37 AM  Result Value Ref Range   Ammonia 31 9 - 35 umol/L    Blood Alcohol level:  Lab Results  Component Value Date   ETH <5 09/02/2016   ETH <5 08/14/2016    Metabolic Disorder Labs: Lab Results  Component Value Date   HGBA1C 5.3 08/17/2016   MPG 105 08/17/2016   No results found for: PROLACTIN Lab Results  Component Value Date   CHOL 119 08/17/2016   TRIG 55 08/17/2016   HDL 32 (L) 08/17/2016   CHOLHDL 3.7 08/17/2016   VLDL 11  08/17/2016   LDLCALC 76 08/17/2016    Physical Findings: AIMS:  , ,  ,  ,    CIWA:    COWS:     Musculoskeletal: Strength & Muscle Tone: within normal limits Gait & Station: normal Patient leans: N/A  Psychiatric Specialty Exam: Physical Exam  Nursing note and vitals reviewed. Constitutional: He appears well-developed and well-nourished.  HENT:  Head: Normocephalic and atraumatic.  Eyes: Conjunctivae are normal. Pupils are equal, round, and reactive to light.  Neck: Normal range of motion.  Cardiovascular: Normal heart sounds.   Respiratory: Effort normal.  GI: Soft.  Musculoskeletal: Normal range of motion.  Neurological: He is alert.  Skin: Skin is warm and dry.  Psychiatric: He has a normal mood and affect. His speech is delayed. He is slowed. Cognition and memory are impaired. He expresses impulsivity. He expresses no suicidal ideation.    Review of Systems  Constitutional: Negative.   HENT: Negative.   Eyes: Negative.   Respiratory: Negative.   Cardiovascular: Negative.   Gastrointestinal: Negative.   Musculoskeletal: Negative.   Skin: Negative.   Neurological: Negative.   Psychiatric/Behavioral: Negative for hallucinations.  All other systems reviewed and are negative.   Blood pressure 133/72, pulse 61, temperature 97.9 F (36.6 C), resp. rate 18, height 6\' 2"  (1.88 m), weight 63.5 kg (140 lb), SpO2 100 %.Body mass index is 17.98 kg/m.  General Appearance: Fairly Groomed  Eye Contact:  Good  Speech:  Clear and Coherent  Volume:  Normal  Mood:  Euphoric  Affect:  Congruent  Thought Process:  Disorganized and Descriptions of Associations: Loose  Orientation:  Full (Time, Place, and Person)  Thought Content:  Delusions and Paranoid Ideation  Suicidal Thoughts:  No  Homicidal Thoughts:  No  Memory:  Immediate;   Fair Recent;   Fair Remote;   Fair  Judgement:  Poor  Insight:  Lacking  Psychomotor Activity:  Increased  Concentration:  Concentration: Fair  and Attention Span: Fair  Recall:  [employer]  Fund of Knowledge:  Fair  Language:  Fair  Akathisia:  No  Handed:  Left  AIMS (if indicated):     Assets:  Communication Skills Desire for Improvement Housing Physical Health Resilience Social Support  ADL's:  Intact  Cognition:  WNL  Sleep:  Number of Hours: 8     Treatment Plan Summary: Daily contact with patient to assess and evaluate symptoms and progress in treatment and Medication management   [name] is a 20 year old male with history of bipolar disorder admitted for agitation and threatening behavior at the house in the context of medication noncompliance.  1. Hypersexual. Security and 1:1 sitter. The patient is no longer allow to participate in programming due to disruptive, inappropriate behavior.  2. Suicidal and homicidal ideation. The patient is able to contract for safety in the hospital.  3. Mood and psychosis. He was restarted on Risperdal. He was given injection of Invega Sustenna 234 mg on 10/31. Next injection on 11/4. We started Depakote for mood  VPA 99. We added Zyprexa 10 mg nightly for further mood stabilization.   4. Insomnia. Trazodone is available.  5. Anxiety. Vistaril is available.  6. Metabolic syndromes screening. This was completed during his recent hospitalization.  7. Substance abuse. The patient was positive for marijuana and supposedly drinks some. There were no symptoms of alcohol withdrawal. The patient minimizes his problems and declines treatment.  8. Disposition. He will be discharged home with his grandparents. [facility]. We will attempt to provide ACT team but the patient has private insurance.  Follow-up Saturday the fourth. No change indicated to medication for today. Patient counseled about medication plan and treatment plan. Continue on close observation. Case reviewed with nursing. If behavior continues to improve could consider  change in his observation status as time goes on. No other change for today.  No change to medication today. Patient can probably be taken off of the security one to one. He is convinced that he is being discharged tomorrow. I'm not sure that that's the actual plan at this point although he does seem to be getting better.   Mordecai RasmussenJohn Midas Daughety, MD 09/08/2016, 1:43 PM

## 2016-09-08 NOTE — Progress Notes (Signed)
Sleeping

## 2016-09-08 NOTE — Progress Notes (Signed)
Pt sitting by his door in hall. Security and sitter present. Redirected for singing in halls.

## 2016-09-08 NOTE — Progress Notes (Signed)
Pt remains on close observations without incident. Pt has been med compliant. Pt denies SI,HI and A/V hallucinations.

## 2016-09-08 NOTE — Progress Notes (Signed)
Pt awake in room sitting on bed. Sitter present.

## 2016-09-08 NOTE — [facility] Group Notes (Signed)
[facility] LCSW Group Therapy  09/08/2016 2:25 PM  Type of Therapy:  Group Therapy  Participation Level:  Patient did not attend group. CSW invited patient to group.   Summary of Progress/Problems: Communications: Patients identify how individuals communicate with one another appropriately and inappropriately. Patients will be guided to discuss their thoughts, feelings, and behaviors related to barriers when communicating. The group will process together ways to execute positive and appropriate communications.   Casy Tavano G. Garnette CzechSampson MSW, LCSWA 09/08/2016, 2:27 PM

## 2016-09-08 NOTE — Progress Notes (Signed)
Pt in hallway. Security and sitter present.

## 2016-09-08 NOTE — [facility] Group Notes (Signed)
[facility] Group Notes:  (Nursing/MHT/Case Management/Adjunct)  Date:  09/08/2016  Time:  10:36 PM  Type of Therapy:  Psychoeducational Skills  Participation Level:  Did Not Attend  Participation Quality:  Summary of Progress/Problems:  Leonard Davidson 09/08/2016, 10:36 PM

## 2016-09-08 NOTE — Progress Notes (Signed)
Pt on close observations without incident.

## 2016-09-08 NOTE — Progress Notes (Signed)
Pt in his room resting. No inappropriate behaviors noted

## 2016-09-09 MED ORDER — SIMETHICONE 80 MG PO CHEW: 160.0000 mg | CHEWABLE_TABLET | Freq: Four times a day (QID) | ORAL | 0 refills | Status: DC | PRN

## 2016-09-09 MED ORDER — TRAZODONE HCL 100 MG PO TABS: 100.0000 mg | ORAL_TABLET | Freq: Every evening | ORAL | 1 refills | Status: DC | PRN

## 2016-09-09 MED ORDER — PALIPERIDONE PALMITATE 234 MG/1.5ML IM SUSP: 234.0000 mg | INTRAMUSCULAR | 1 refills | Status: DC

## 2016-09-09 MED ORDER — DIVALPROEX SODIUM 500 MG PO DR TAB: 1500.0000 mg | DELAYED_RELEASE_TABLET | Freq: Every day | ORAL | 1 refills | Status: DC

## 2016-09-09 MED ORDER — PALIPERIDONE PALMITATE 234 MG/1.5ML IM SUSP: 234.0000 mg | INTRAMUSCULAR | Status: DC

## 2016-09-09 NOTE — Discharge Summary (Signed)
Physician Discharge Summary Note  Patient:  Leonard FuchsWilliam J Davidson is an 20 y.o., male MRN:  [id number] DOB:  Nov 29, 1995 Patient phone:  [phone] (home)  Patient address:   [address] [place] [address],  Total Time spent with patient: 30 minutes  Date of Admission:  09/02/2016 Date of Discharge: 09/09/2016  Reason for Admission:  Psychotic break.  Identifying data. Mr. Leonard Davidson  Is a 20 year old male with a history of psychosis and mood  Instability.   Chief complaint.  " I need medical marijuana."  History of present illness.  Information was obtained  from the patient and the chart. The patient believes that he was brought to the hospital by his family after he had a small argument with his mother. According to the chart, he was brought to the hospital by the police after he became aggressive and swinging at his grandmother. The patient admits that following discharge from our hospital a few weeks ago he did not see mental health provider, neither he prescribed medications. The patient himself does not believe that he has any problems whatsoever. He denies symptoms of depression, anxiety, or psychosis. He has sleep that he smokes marijuana but is negative for substances on admission. He does not drink alcohol. He only wants to discuss medicine marijuana and believes that he should be given medicinal marijuana in the hospital as well as prescription to take home. He is rather grandiose, restless, hyperverbal, delusional, and intrusive. According to the family the patient became increasingly irritable and argumentative leading to agitation and threatening behavior towards his grandmother.  Past psychiatric history. This is his third psychiatric hospitalization. He carries a diagnosis of bipolar disorder or possibly schizophrenia. He was treated with Risperdal with the results during his recent hospitalization but did not follow up with outpatient treatment. He denies ever  attempting suicide.  Family psychiatric history. Grandfather with bipolar illness.  Social history. He graduated from high school "I'm not down". He lives with his grandparents. He works as a Public affairs consultantdishwasher.    Principal Problem: Bipolar disorder, current episode manic severe with psychotic features [facility](HCC) Discharge Diagnoses: Patient Active Problem List   Diagnosis Date Noted  . Noncompliance [Z91.19] 09/02/2016  . Cannabis use disorder, severe, dependence (HCC) [F12.20] 08/16/2016  . Bipolar disorder, current episode manic severe with psychotic features (HCC) [F31.2] 08/16/2016   Past Medical History:  Past Medical History:  Diagnosis Date  . Bipolar 1 disorder (HCC)    History reviewed. No pertinent surgical history. Family History: History reviewed. No pertinent family history.  Social History:  History  Alcohol Use No     History  Drug Use  . Types: Marijuana    Social History   Social History  . Marital status: Single    Spouse name: N/A  . Number of children: N/A  . Years of education: N/A   Social History Main Topics  . Smoking status: Never Smoker  . Smokeless tobacco: Never Used  . Alcohol use No  . Drug use:     Types: Marijuana  . Sexual activity: Not Asked   Other Topics Concern  . None   Social History Narrative  . None    Hospital Course:    Mr. Leonard Davidson is a 20 year old male with history of bipolar disorder admitted for agitation and threatening behavior at the house in the context of medication noncompliance.  1. Hypersexual behavior. Resolved.   2. Suicidal and homicidal ideation. Resolved. The patient is able to contract for safety.  He is forward thinking and optimistic about the future.   3. Mood and psychosis. He was restarted on oral Risperdal and given both initial injections of [place]. Next injection on 10/04/2016. We started Depakote for mood stabilization. VPA 99.   4. Insomnia. Trazodone was available.  5. Anxiety.  Vistaril was available.  6. Metabolic syndrome screening. Thiswas completed during his recent hospitalization.  7. Substance abuse. The patient was positive for marijuana and supposedly drinks some. There were no symptoms of alcohol withdrawal. The patient minimizes his problems and declines treatment.  8. Disposition. He was discharged to home with his grandparents. He will [facility] up with [facility]. We asked for 90 days of involuntarly psychiatric commitment to improve compliance.    Physical Findings: AIMS:  , ,  ,  ,    CIWA:    COWS:     Musculoskeletal: Strength & Muscle Tone: within normal limits Gait & Station: normal Patient leans: N/A  Psychiatric Specialty Exam: Physical Exam  Nursing note and vitals reviewed.   Review of Systems  All other systems reviewed and are negative.   Blood pressure (!) 124/56, pulse 87, temperature 97.9 F (36.6 C), temperature source Oral, resp. rate 18, height 6\' 2"  (1.88 m), weight 63.5 kg (140 lb), SpO2 100 %.Body mass index is 17.98 kg/m.  General Appearance: Casual  Eye Contact:  Good  Speech:  Clear and Coherent  Volume:  Normal  Mood:  Euthymic  Affect:  Appropriate  Thought Process:  Goal Directed and Descriptions of Associations: Intact  Orientation:  Full (Time, Place, and Person)  Thought Content:  WDL  Suicidal Thoughts:  No  Homicidal Thoughts:  No  Memory:  Immediate;   Fair Recent;   Fair Remote;   Fair  Judgement:  Poor  Insight:  Shallow  Psychomotor Activity:  Normal  Concentration:  Concentration: Fair and Attention Span: Fair  Recall:  [employer] of Knowledge:  Fair  Language:  Fair  Akathisia:  No  Handed:  Right  AIMS (if indicated):     Assets:  Communication Skills Desire for Improvement Financial Resources/Insurance Housing Physical Health Resilience Social Support  ADL's:  Intact  Cognition:  WNL  Sleep:  Number of Hours: 6.3     Have you used any form of tobacco in the  last 30 days? (Cigarettes, Smokeless Tobacco, Cigars, and/or Pipes): No  Has this patient used any form of tobacco in the last 30 days? (Cigarettes, Smokeless Tobacco, Cigars, and/or Pipes) Yes, No  Blood Alcohol level:  Lab Results  Component Value Date   [facility] <5 09/02/2016   ETH <5 08/14/2016    Metabolic Disorder Labs:  Lab Results  Component Value Date   HGBA1C 5.3 08/17/2016   MPG 105 08/17/2016   No results found for: PROLACTIN Lab Results  Component Value Date   CHOL 119 08/17/2016   TRIG 55 08/17/2016   HDL 32 (L) 08/17/2016   CHOLHDL 3.7 08/17/2016   VLDL 11 08/17/2016   LDLCALC 76 08/17/2016    See Psychiatric Specialty Exam and Suicide Risk Assessment completed by Attending Physician prior to discharge.  Discharge destination:  Home  Is patient on multiple antipsychotic therapies at discharge:  No   Has Patient had three or more failed trials of antipsychotic monotherapy by history:  No  Recommended Plan for Multiple Antipsychotic Therapies: NA  Discharge Instructions    Diet - low sodium heart healthy    Complete by:  As directed  Increase activity slowly    Complete by:  As directed        Medication List    STOP taking these medications   BANOPHEN 25 mg capsule Generic drug:  diphenhydrAMINE   risperiDONE 2 MG tablet Commonly known as:  RISPERDAL     TAKE these medications     Indication  divalproex 500 MG DR tablet Commonly known as:  DEPAKOTE Take 3 tablets (1,500 mg total) by mouth at bedtime.  Indication:  Manic Phase of Manic-Depression   paliperidone 234 MG/1.5ML Susp injection Commonly known as:  INVEGA SUSTENNA Inject 234 mg into the muscle every 28 (twenty-eight) days. Start taking on:  10/04/2016  Indication:  Schizoaffective Disorder   simethicone 80 MG chewable tablet Commonly known as:  MYLICON Chew 2 tablets (160 mg total) by mouth 4 (four) times daily as needed for flatulence.  Indication:  Gas   traZODone 100 MG  tablet Commonly known as:  DESYREL Take 1 tablet (100 mg total) by mouth at bedtime as needed for sleep.  Indication:  Trouble Sleeping      Follow-up Information    [facility] DO NOT SEND NOT COMPLETE SEE CSW! Follow up.   Why:  Please contact your ACT Team representative Liborio NixonJanice upon discharge at ph: [phone] Liborio NixonJanice and your doctor will arrive at your home the day after discharge for your hospital follow up for meidcation managment, substance abuse treatment and therapy Contact information: [facility] [address] [place], [place] [address] Ph: [phone] Fax: [phone]          Follow-up recommendations:  Activity:  as tolerated. Diet:  regular. Other:  keep follow up appointments.   Comments:    Signed: Kristine LineaJolanta Swati Granberry, MD 09/09/2016, 12:31 PM

## 2016-09-09 NOTE — Progress Notes (Signed)
Patient denies SI/HI, denies A/V hallucinations. Patient verbalizes understanding of discharge instructions, follow up care and prescriptions. Patient given all belongings from  locker. Patient escorted out by staff, transported by family. 

## 2016-09-09 NOTE — Progress Notes (Signed)
Pt denies SI/HI/AVH. Forwards little. Restless. Remains on close observation without issue. Did not attend evening group. Does not participate in treatment. Med compliant. Safety maintained.

## 2016-09-09 NOTE — Plan of Care (Signed)
Problem: Coping: Goal: Ability to verbalize frustrations and anger appropriately will improve Outcome: Not Progressing Pt forwards little. Antagonizes peers when upset.

## 2016-09-09 NOTE — [facility] Suicide Risk Assessment (Signed)
[facility]   Principal Problem: Bipolar disorder, current episode manic severe with psychotic features [facility](HCC) Discharge Diagnoses:  Patient Active Problem List   Diagnosis Date Noted  . Noncompliance [Z91.19] 09/02/2016  . Cannabis use disorder, severe, dependence (HCC) [F12.20] 08/16/2016  . Bipolar disorder, current episode manic severe with psychotic features (HCC) [F31.2] 08/16/2016    Total Time spent with patient: 30 minutes  Musculoskeletal: Strength & Muscle Tone: within normal limits Gait & Station: normal Patient leans: N/A  Psychiatric Specialty Exam: Review of Systems  All other systems reviewed and are negative.   Blood pressure (!) 124/56, pulse 87, temperature 97.9 F (36.6 C), temperature source Oral, resp. rate 18, height 6\' 2"  (1.88 m), weight 63.5 kg (140 lb), SpO2 100 %.Body mass index is 17.98 kg/m.  General Appearance: Casual  Eye Contact::  Good  Speech:  Clear and Coherent409  Volume:  Normal  Mood:  Euthymic  Affect:  Appropriate  Thought Process:  Goal Directed and Descriptions of Associations: Intact  Orientation:  Full (Time, Place, and Person)  Thought Content:  WDL  Suicidal Thoughts:  No  Homicidal Thoughts:  No  Memory:  Immediate;   Fair Recent;   Fair Remote;   Fair  Judgement:  Poor  Insight:  Shallow  Psychomotor Activity:  Normal  Concentration:  Fair  Recall:  [employer]  Fund of Knowledge:Fair  Language: Fair  Akathisia:  No  Handed:  Right  AIMS (if indicated):     Assets:  Communication Skills Desire for Improvement Financial Resources/Insurance Housing Physical Health Resilience Social Support  Sleep:  Number of Hours: 6.3  Cognition: WNL  ADL's:  Intact   Mental Status Per Nursing Assessment::   On Admission:  NA (pt denies)  Demographic Factors:  Male and Unemployed  Loss Factors: NA  Historical Factors: Family history of mental illness or substance abuse and Impulsivity  Risk  Reduction Factors:   Sense of responsibility to family, Living with another person, especially a relative, Positive social support and Positive therapeutic relationship  Continued Clinical Symptoms:  Alcohol/Substance Abuse/Dependencies Schizophrenia:   Less than 20 years old  Cognitive Features That Contribute To Risk:  None    Suicide Risk:  Minimal: No identifiable suicidal ideation.  Patients presenting with no risk factors but with morbid ruminations; may be classified as minimal risk based on the severity of the depressive symptoms  Follow-up Information    [facility] DO NOT SEND NOT COMPLETE SEE CSW! Follow up.   Why:  Please contact your ACT Team representative Liborio NixonJanice upon discharge at ph: [phone] Liborio NixonJanice and your doctor will arrive at your home the day after discharge for your hospital follow up for meidcation managment, substance abuse treatment and therapy Contact information: [facility] [address] [place], [place] [address] Ph: [phone] Fax: [phone]          Plan Of Care/Follow-up recommendations:  Activity:  as tolerated. Diet:  regular. Other:  keep follow up appointments.  Kristine LineaJolanta Pucilowska, MD 09/09/2016, 12:28 PM

## 2016-09-09 NOTE — Progress Notes (Signed)
  [facility] :  Will you be returning to the same living situation after discharge:  Yes,  [place] At discharge, do you have transportation home?: Yes,  pt will be picked up by his grandfather Do you have the ability to pay for your medications: Yes,  pt will be provided with prescriptions at discharge  Release of information consent forms completed and in the chart;  Patient's signature needed at discharge.  Patient to Follow up at: Follow-up Information    [facility].   Why:  Please arrive at on Tuesday November 7th at 11am to see your psychiatrist for your hospital follow up for medication managment, substance abuse treatment and therapy  .  Please contact Liborio NixonJanice upon discharge at ph: [phone] for questions and concerns. Contact information: [facility] [address] [place], [place] [address] Ph: [phone] Fax: [phone]          Next level of care provider has access [facility] Link:no  Safety Planning and Suicide Prevention discussed: Yes,  completed with pt  Have you used any form of tobacco in the last 30 days? (Cigarettes, Smokeless Tobacco, Cigars, and/or Pipes): No  Has patient been referred to the Quitline?: N/A patient is not a smoker  Patient has been referred for addiction treatment: Yes  Leonard Davidson 09/09/2016, 2:42 PM

## 2016-09-09 NOTE — [facility] Suicide Risk Assessment (Signed)
[facility] INPATIENT:  Family/Significant Other Suicide Prevention Education  Suicide Prevention Education:  Patient Refusal for Family/Significant Other Suicide Prevention Education: The patient Leonard Davidson has refused to provide written consent for family/significant other to be provided Family/Significant Other Suicide Prevention Education during admission and/or prior to discharge.  Physician notified.  CSW completed SPE with the pt.    [name] 09/09/2016, 2:34 PM

## 2016-09-09 NOTE — Progress Notes (Signed)
Recreation Therapy Notes  Date: 11.06.17 Time: 9:30 am Location: Craft Room  Group Topic: Self-expression  Goal Area(s) Addresses:  Patient will identify one color per emotion listed on wheel.  Patient will verbalize benefit of using art as a means of self-expression. Patient will verbalize one emotion experienced during session. Patient will be educated on other forms of self-expression.  Behavioral Response: Attentive, Interactive  Intervention: Emotion Wheel  Activity: Patients were given a worksheet with 7 emotions listed and were instructed to pick a color for each emotion.  Education: LRT educated patients on other forms of self-expression.  Education Outcome: Acknowledges education/In group clarification offered  Clinical Observations/Feedback: Patient picked a color for each emotion. Patient contributed to group discussion by stating what colors he picked for certain emotions, and how it felt to describe his emotions in color.  Jacquelynn CreeGreene,Jerrye Seebeck M, LRT/CTRS 09/09/2016 10:21 AM

## 2016-09-09 NOTE — Tx Team (Signed)
Interdisciplinary Treatment and Diagnostic Plan Update  09/09/2016 Time of Session: 10:30am Leonard Davidson MRN: [id number]  Principal Diagnosis: Bipolar disorder, current episode manic severe with psychotic features [facility])  Secondary Diagnoses: Principal Problem:   Bipolar disorder, current episode manic severe with psychotic features (HCC) Active Problems:   Cannabis use disorder, severe, dependence (HCC)   Noncompliance   Current Medications:  Current Facility-Administered Medications  Medication Dose Route Frequency Provider Last Rate Last Dose  . acetaminophen (TYLENOL) tablet 650 mg  650 mg Oral Q6H PRN Audery Amel, MD   650 mg at 09/08/16 1755  . alum & mag hydroxide-simeth (MAALOX/MYLANTA) 200-200-20 MG/5ML suspension 30 mL  30 mL Oral Q4H PRN Audery Amel, MD   30 mL at 09/06/16 1421  . diphenhydrAMINE (BENADRYL) capsule 25 mg  25 mg Oral BID Audery Amel, MD   25 mg at 09/09/16 0836  . divalproex (DEPAKOTE) DR tablet 500 mg  500 mg Oral Q8H [name], MD   500 mg at 09/09/16 1344  . hydrOXYzine (ATARAX/VISTARIL) tablet 25 mg  25 mg Oral TID PRN Audery Amel, MD   25 mg at 09/08/16 4098  . magnesium hydroxide (MILK OF MAGNESIA) suspension 30 mL  30 mL Oral Daily PRN Audery Amel, MD   30 mL at 09/06/16 1421  . OLANZapine zydis (ZYPREXA) disintegrating tablet 10 mg  10 mg Oral TID PRN [name], MD      . [name] ON 10/04/2016] paliperidone (INVEGA SUSTENNA) injection 234 mg  234 mg Intramuscular Q28 days [name], MD      . simethicone (MYLICON) chewable tablet 160 mg  160 mg Oral QID PRN [name], MD   160 mg at 09/08/16 1436  . traZODone (DESYREL) tablet 100 mg  100 mg Oral QHS PRN Audery Amel, MD   100 mg at 09/08/16 2133   Current Outpatient Prescriptions  Medication Sig Dispense Refill  . divalproex (DEPAKOTE) 500 MG DR tablet Take 3 tablets (1,500 mg total) by mouth at bedtime. 90 tablet 1  . [START ON  10/04/2016] paliperidone (INVEGA SUSTENNA) 234 MG/1.5ML SUSP injection Inject 234 mg into the muscle every 28 (twenty-eight) days. 0.9 mL 1  . simethicone (MYLICON) 80 MG chewable tablet Chew 2 tablets (160 mg total) by mouth 4 (four) times daily as needed for flatulence. 30 tablet 0  . traZODone (DESYREL) 100 MG tablet Take 1 tablet (100 mg total) by mouth at bedtime as needed for sleep. 30 tablet 1   PTA Medications: No prescriptions prior to admission.    Patient Stressors: Marital or family conflict  Patient Strengths: Active sense of humor Average or above average intelligence Physical Health  Treatment Modalities: Medication Management, Group therapy, Case management,  1 to 1 session with clinician, Psychoeducation, Recreational therapy.   Physician Treatment Plan for Primary Diagnosis: Bipolar disorder, current episode manic severe with psychotic features (HCC) Long Term Goal(s): Improvement in symptoms so as ready for discharge Improvement in symptoms so as ready for discharge   Short Term Goals: Ability to identify changes in lifestyle to reduce recurrence of condition will improve Ability to verbalize feelings will improve Ability to disclose and discuss suicidal ideas Ability to demonstrate self-control will improve Ability to identify and develop effective coping behaviors will improve Ability to maintain clinical measurements within normal limits will improve Compliance with prescribed medications will improve Ability to identify changes in lifestyle to reduce recurrence of condition will improve Ability to  demonstrate self-control will improve Ability to identify triggers associated with substance abuse/mental health issues will improve  Medication Management: Evaluate patient's response, side effects, and tolerance of medication regimen.  Therapeutic Interventions: 1 to 1 sessions, Unit Group sessions and Medication administration.  Evaluation of Outcomes: Adequate  for discharge   Physician Treatment Plan for Secondary Diagnosis: Principal Problem:   Bipolar disorder, current episode manic severe with psychotic features (HCC) Active Problems:   Cannabis use disorder, severe, dependence (HCC)   Noncompliance  Long Term Goal(s): Improvement in symptoms so as ready for discharge Improvement in symptoms so as ready for discharge   Short Term Goals: Ability to identify changes in lifestyle to reduce recurrence of condition will improve Ability to verbalize feelings will improve Ability to disclose and discuss suicidal ideas Ability to demonstrate self-control will improve Ability to identify and develop effective coping behaviors will improve Ability to maintain clinical measurements within normal limits will improve Compliance with prescribed medications will improve Ability to identify changes in lifestyle to reduce recurrence of condition will improve Ability to demonstrate self-control will improve Ability to identify triggers associated with substance abuse/mental health issues will improve     Medication Management: Evaluate patient's response, side effects, and tolerance of medication regimen.  Therapeutic Interventions: 1 to 1 sessions, Unit Group sessions and Medication administration.  Evaluation of Outcomes: Adequate for discharge    RN Treatment Plan for Primary Diagnosis: Bipolar disorder, current episode manic severe with psychotic features (HCC) Long Term Goal(s): Knowledge of disease and therapeutic regimen to maintain health will improve  Short Term Goals: Ability to verbalize feelings will improve and Compliance with prescribed medications will improve  Medication Management: RN will administer medications as ordered by provider, will assess and evaluate patient's response and provide education to patient for prescribed medication. RN will report any adverse and/or side effects to prescribing provider.  Therapeutic  Interventions: 1 on 1 counseling sessions, Psychoeducation, Medication administration, Evaluate responses to treatment, Monitor vital signs and CBGs as ordered, Perform/monitor CIWA, COWS, AIMS and Fall Risk screenings as ordered, Perform wound care treatments as ordered.  Evaluation of Outcomes: Adequate for discharge    LCSW Treatment Plan for Primary Diagnosis: Bipolar disorder, current episode manic severe with psychotic features (HCC) Long Term Goal(s): Safe transition to appropriate next level of care at discharge, Engage patient in therapeutic group addressing interpersonal concerns.  Short Term Goals: Engage patient in aftercare planning with referrals and resources, Increase social support, Increase ability to appropriately verbalize feelings, Increase emotional regulation, Facilitate acceptance of mental health diagnosis and concerns, Facilitate patient progression through stages of change regarding substance use diagnoses and concerns, Identify triggers associated with mental health/substance abuse issues and Increase skills for wellness and recovery  Therapeutic Interventions: Assess for all discharge needs, 1 to 1 time with Social worker, Explore available resources and support systems, Assess for adequacy in community support network, Educate family and significant other(s) on suicide prevention, Complete Psychosocial Assessment, Interpersonal group therapy.  Evaluation of Outcomes: Adequate for discharge    Progress in Treatment: Attending groups: No. CSW still assessing, pt new to milieu Participating in groups: No. CSW still assessing, pt new to milieu Taking medication as prescribed: Yes. Toleration medication: Yes. Family/Significant other contact made: No, will contact:  CSW still assessing for contacts Patient understands diagnosis: Yes. Discussing patient identified problems/goals with staff: Yes. Medical problems stabilized or resolved: Yes Denies suicidal/homicidal  ideation: Yes. Issues/concerns per patient self-inventory: No. Other: none reported  New problem(s) identified: No,  Describe:  none reported  New Short Term/Long Term Goal(s):  Discharge Plan or Barriers: [place] and [facility] for medication management, substance abuse treatment and therapy.   Reason for Continuation of Hospitalization: Adequate for discharge per MD on 09/09/16 Aggression, anxiety, delusions  Estimated Date of discharge 09/09/16    Attendees:  Patient:  09/09/2016 10:50 AM  Physician: Dr. Jennet MaduroPucilowska, MD 09/09/2016 10:50 AM  Nursing: Adriana MccallumPhyllis Campbell, RN 09/09/2016 10:50 AM  RN Care Manager: 09/09/2016 10:50 AM  Social Worker: Leonard Davidson, LCSWA, LCAS 09/09/2016 10:50 AM  Recreational Therapist: Hershal CoriaBeth Greene, LRT 09/09/2016 10:50 AM  Other:  09/09/2016 10:50 AM  Other:  09/09/2016 10:50 AM  Other: 09/09/2016 10:50 AM    Scribe for Treatment Team: Leonard PeaJonathan F Laster Appling, LCSWA

## 2016-09-12 ENCOUNTER — Telehealth: Payer: Self-pay | Admitting: Licensed Clinical Social Worker

## 2016-09-16 ENCOUNTER — Ambulatory Visit: Payer: 59 | Admitting: Licensed Clinical Social Worker

## 2016-10-06 ENCOUNTER — Ambulatory Visit
Admission: EM | Admit: 2016-10-06 | Discharge: 2016-10-06 | Disposition: A | Discharge status: Home / Self Care | Payer: 59 | Attending: Family Medicine | Admitting: Family Medicine

## 2016-10-06 ENCOUNTER — Encounter: Payer: Self-pay | Admitting: *Deleted

## 2016-10-06 DIAGNOSIS — Z4802 Encounter for removal of sutures: Secondary | ICD-10-CM

## 2016-10-06 NOTE — ED Triage Notes (Signed)
Here for suture removal. Site is left eye brow. Site appears normal, no redness, drainage, edema, etc.

## 2016-10-06 NOTE — ED Provider Notes (Signed)
[facility]    CSN: [id number] Arrival date & time: 10/06/16  1028     History   Chief Complaint Chief Complaint  Patient presents with  . Suture / Staple Removal    HPI Christiana FuchsWilliam J Price is a 20 y.o. male.   20 yo male with a h/o laceration over one month ago while in ED, here for suture removal. Denies any pain or problems with the laceration. States has healed.    The history is provided by the patient.  Suture / Staple Removal     Past Medical History:  Diagnosis Date  . Bipolar 1 disorder [facility](HCC)     Patient Active Problem List   Diagnosis Date Noted  . Noncompliance 09/02/2016  . Cannabis use disorder, severe, dependence (HCC) 08/16/2016  . Bipolar disorder, current episode manic severe with psychotic features (HCC) 08/16/2016    History reviewed. No pertinent surgical history.     Home Medications    Prior to Admission medications   Medication Sig Start Date End Date Taking? Authorizing Provider  divalproex (DEPAKOTE) 500 MG DR tablet Take 3 tablets (1,500 mg total) by mouth at bedtime. 09/09/16   Shari ProwsJolanta B Pucilowska, MD  paliperidone (INVEGA SUSTENNA) 234 MG/1.5ML SUSP injection Inject 234 mg into the muscle every 28 (twenty-eight) days. 10/04/16   Shari ProwsJolanta B Pucilowska, MD  simethicone (MYLICON) 80 MG chewable tablet Chew 2 tablets (160 mg total) by mouth 4 (four) times daily as needed for flatulence. 09/09/16   Shari ProwsJolanta B Pucilowska, MD  traZODone (DESYREL) 100 MG tablet Take 1 tablet (100 mg total) by mouth at bedtime as needed for sleep. 09/09/16   Shari ProwsJolanta B Pucilowska, MD    Family History History reviewed. No pertinent family history.  Social History Social History  Substance Use Topics  . Smoking status: Never Smoker  . Smokeless tobacco: Never Used  . Alcohol use No     Allergies   Patient has no known allergies.   Review of Systems Review of Systems   Physical Exam Triage Vital Signs ED Triage Vitals  Enc Vitals  Group     BP 10/06/16 1040 (!) 129/59     Pulse Rate 10/06/16 1040 71     Resp 10/06/16 1040 16     Temp 10/06/16 1040 97.6 F (36.4 C)     Temp Source 10/06/16 1040 Oral     SpO2 10/06/16 1040 98 %     Weight 10/06/16 1043 170 lb (77.1 kg)     Height 10/06/16 1043 6\' 3"  (1.905 m)     Head Circumference --      Peak Flow --      Pain Score --      Pain Loc --      Pain Edu? --      Excl. in GC? --    No data found.   Updated Vital Signs BP (!) 129/59 (BP Location: Left Arm)   Pulse 71   Temp 97.6 F (36.4 C) (Oral)   Resp 16   Ht 6\' 3"  (1.905 m)   Wt 170 lb (77.1 kg)   SpO2 98%   BMI 21.25 kg/m   Visual Acuity Right Eye Distance:   Left Eye Distance:   Bilateral Distance:    Right Eye Near:   Left Eye Near:    Bilateral Near:     Physical Exam  Constitutional: He appears well-developed and well-nourished. No distress.  Skin: He is not diaphoretic.  Well healed left eyebrow  laceration  Nursing note and vitals reviewed.    UC Treatments / Results  Labs (all labs ordered are listed, but only abnormal results are displayed) Labs Reviewed - No data to display  EKG  EKG Interpretation None       Radiology No results found.  Procedures Procedures (including critical care time)  Medications Ordered in UC Medications - No data to display   Initial Impression / Assessment and Plan / UC Course  I have reviewed the triage vital signs and the nursing notes.  Pertinent labs & imaging results that were available during my care of the patient were reviewed by me and considered in my medical decision making (see chart for details).  Clinical Course       Final Clinical Impressions(s) / UC Diagnoses   Final diagnoses:  Visit for suture removal    New Prescriptions Discharge Medication List as of 10/06/2016 10:57 AM     Sutures removed. Well healed laceration.  F/u prn   Payton Mccallumrlando Ireene Ballowe, MD 10/06/16 1201

## 2017-11-10 IMAGING — CT CT HEAD W/O CM
4 of 5 series · 15 of 47 positions shown, 17 images · non-contrast
Comparison: None.

CLINICAL DATA: Paranoia

EXAM:
CT HEAD WITHOUT CONTRAST
TECHNIQUE: Contiguous axial images were obtained from the base of the skull
through the vertex without intravenous contrast.

[Series 2: head wo · axial · 0.40mm/px · z∈[-88,+12]mm · 5 of 31 slices shown, 7 images]
[im 6/31  brain]
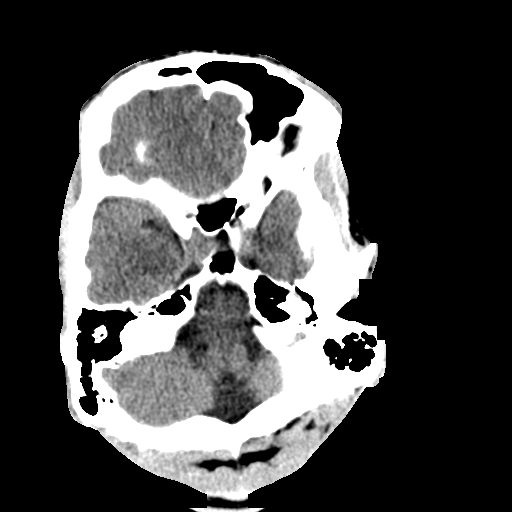
[im 6/31  bone]
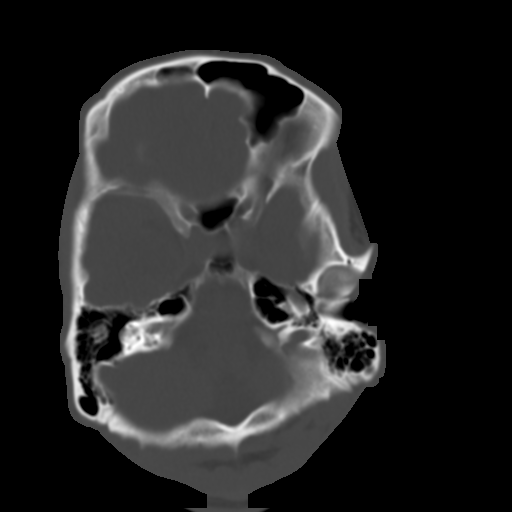
[im 11/31  brain]
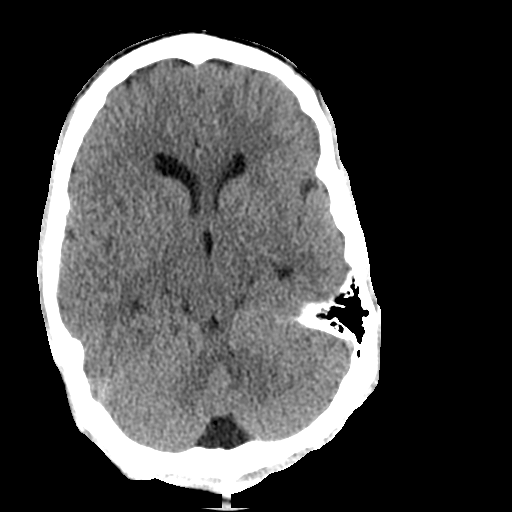
[im 16/31  brain]
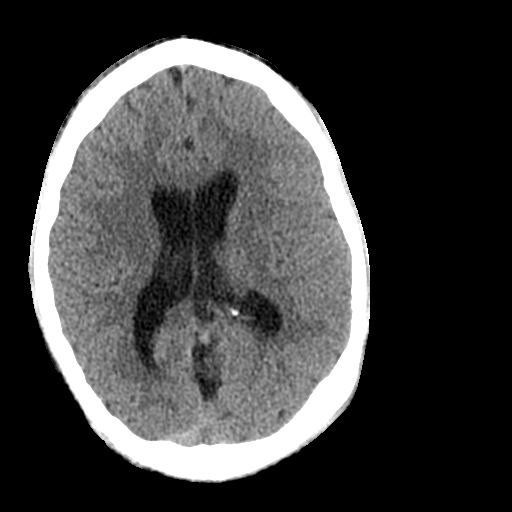
[im 21/31  brain]
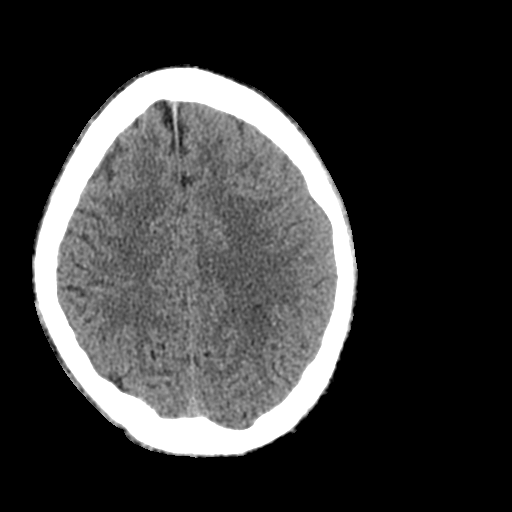
[im 26/31  brain]
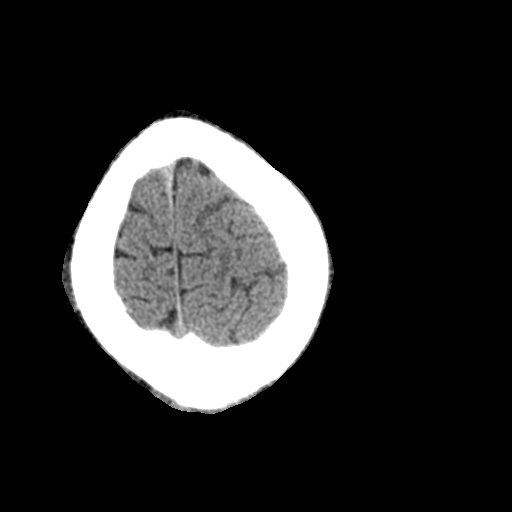
[im 26/31  bone]
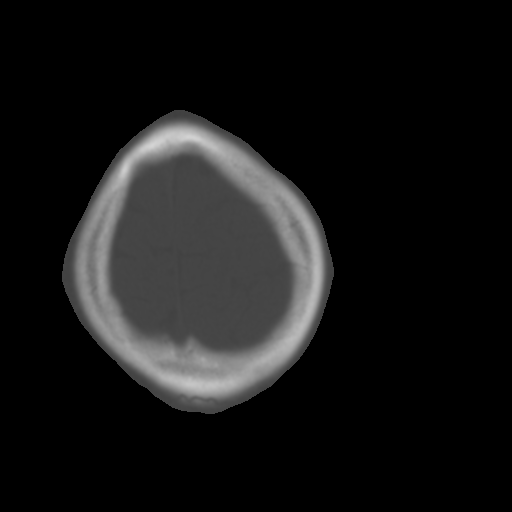

[Series 4: ax head wo recon · axial · 0.40mm/px · z∈[-89,-10]mm · 4 of 28 slices shown]
[im 6/28  brain]
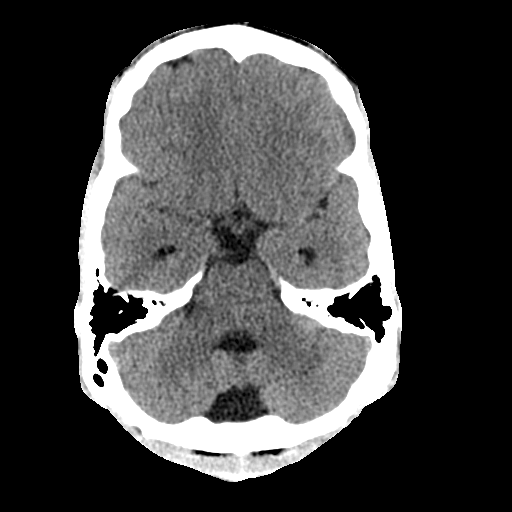
[im 11/28  brain]
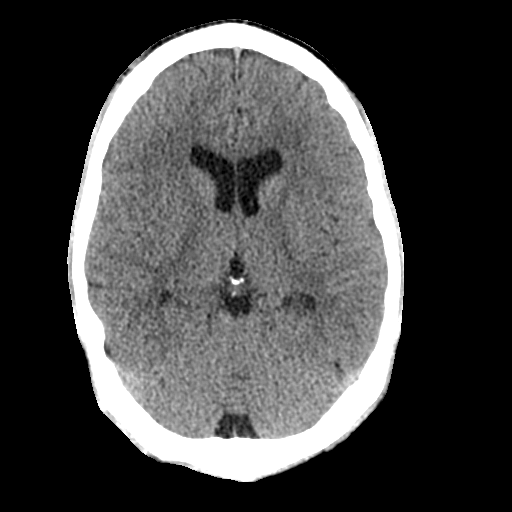
[im 17/28  brain]
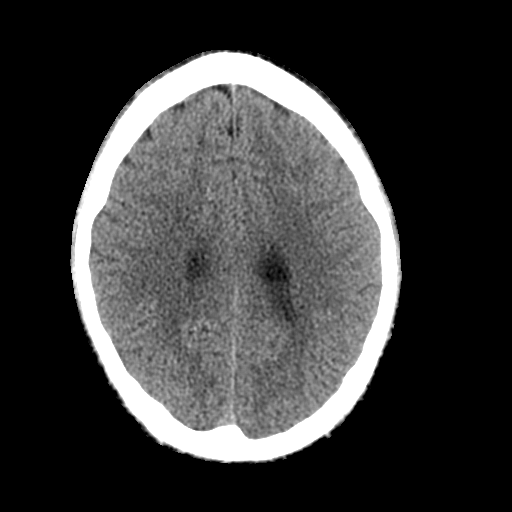
[im 22/28  brain]
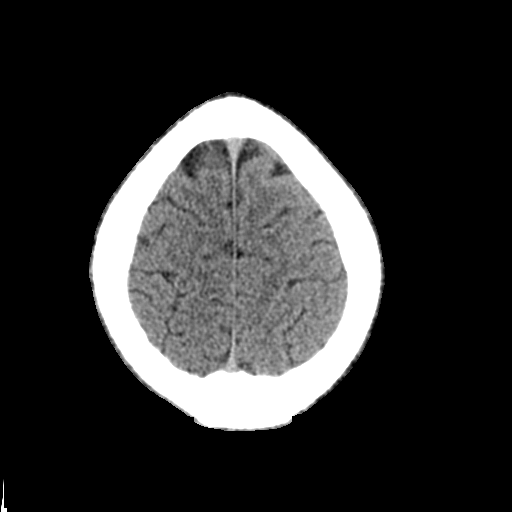

[Series 6: coronal soft tissue · coronal · 0.31mm/px · 3 of 63 slices shown]
[im 21/63  brain]
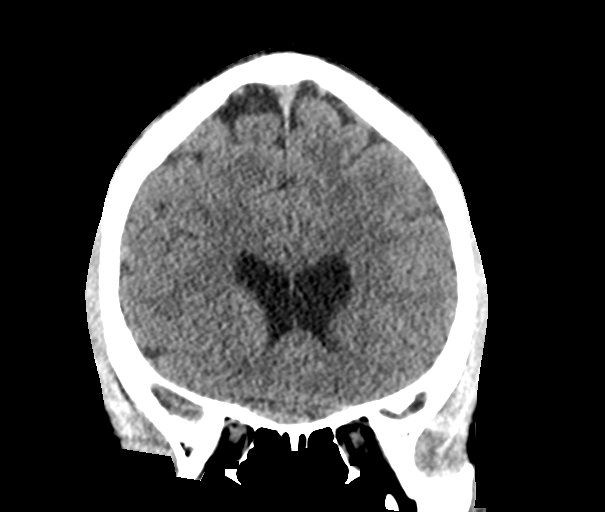
[im 28/63  brain]
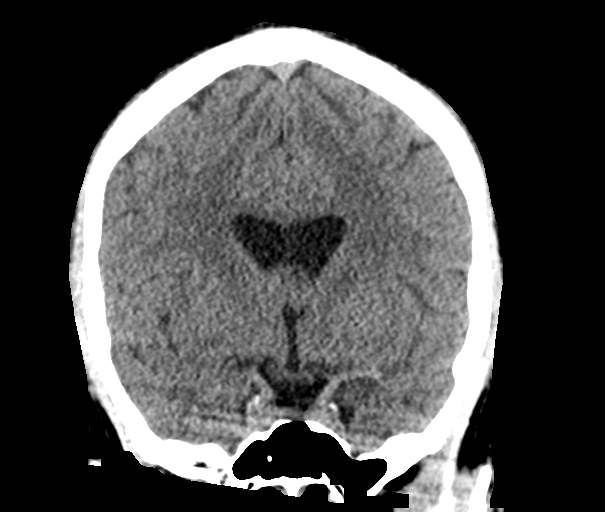
[im 35/63  brain]
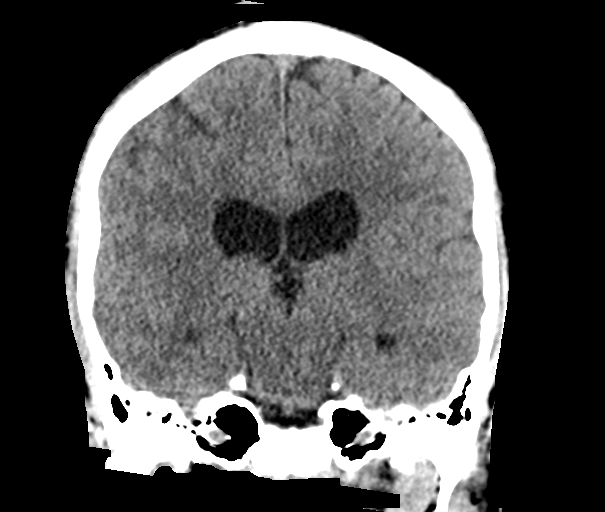

[Series 7: sagittal soft tissue · sagittal · 0.31mm/px · 3 of 48 slices shown]
[im 17/48  brain]
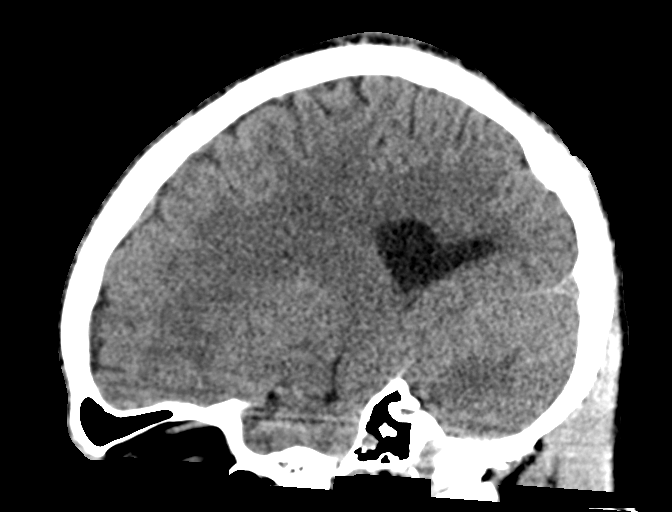
[im 24/48  brain]
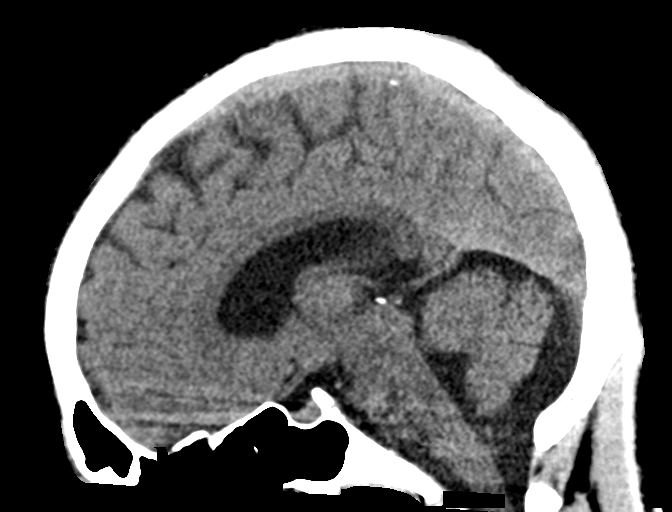
[im 32/48  brain]
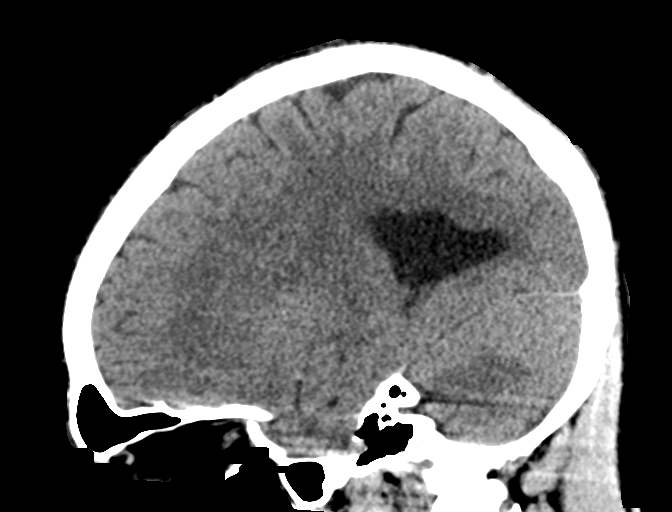

[15 of 47 positions shown; findings below may reference images not displayed]

FINDINGS: Brain: No evidence of acute infarction, hemorrhage, hydrocephalus,
extra-axial collection or mass lesion/mass effect.

Vascular: No hyperdense vessel or unexpected calcification.

Skull: Normal. Negative for fracture or focal lesion.

Sinuses/Orbits: No acute finding.

Other: None.
IMPRESSION: No acute abnormality noted.

## 2018-06-12 ENCOUNTER — Encounter: Payer: Self-pay | Admitting: Adult Health

## 2018-06-12 ENCOUNTER — Ambulatory Visit: Payer: Managed Care, Other (non HMO) | Admitting: Adult Health

## 2018-06-12 VITALS — BP 122/70 | HR 77 | Resp 16 | Ht 75.0 in | Wt 200.0 lb

## 2018-06-12 DIAGNOSIS — F312 Bipolar disorder, current episode manic severe with psychotic features: Secondary | ICD-10-CM

## 2018-06-12 DIAGNOSIS — Z0001 Encounter for general adult medical examination with abnormal findings: Secondary | ICD-10-CM

## 2018-06-12 NOTE — Patient Instructions (Signed)
Living With Bipolar Disorder If you have been diagnosed with bipolar disorder, you may be relieved that you now know why you have felt or behaved a certain way. You may also feel overwhelmed about the treatment ahead, how to get the support you need, and how to deal with the condition day-to-day. With care and support, you can learn to manage your symptoms and live with bipolar disorder. How to manage lifestyle changes Managing stress Stress is your body's reaction to life changes and events, both good and bad. Stress can play a major role in bipolar disorder, so it is important to learn how to cope with stress. Some techniques to cope with stress include:  Meditation, muscle relaxation, and breathing exercises.  Exercise. Even a short daily walk can help to lower stress levels.  Getting enough good-quality sleep. Too little sleep can cause mania to start (can trigger mania).  Making a schedule to manage your time. Knowing your daily schedule can help to keep you from feeling overwhelmed by tasks and deadlines.  Spending time on hobbies that you enjoy.  Medicines Your health care provider may suggest certain medicines if he or she feels that they will help improve your condition. Avoid using caffeine, alcohol, and other substances that may prevent your medicines from working properly (may interact). It is also important to:  Talk with your pharmacist or health care provider about all the medicines that you take, their possible side effects, and which medicines are safe to take together.  Make it your goal to take part in all treatment decisions (shared decision-making). Ask about possible side effects of medicines that your health care provider recommends, and tell him or her how you feel about having those side effects. It is best if shared decision-making with your health care provider is part of your total treatment plan.  If you are taking medicines as part of your treatment, do not stop  taking medicines before you ask your health care provider if it is safe to stop. You may need to have the medicine slowly decreased (tapered) over time to decrease the risk of harmful side effects. Relationships Spend time with people that you trust and with whom you feel a sense of understanding and calm. Try to find friends or family members who make you feel safe and can help you control feelings of mania. Consider going to couples counseling, family education classes, or family therapy to:  Educate your loved ones about your condition and offer suggestions about how they can support you.  Help resolve conflicts.  Help develop communication skills in your relationships.  How to recognize changes in your condition Everyone responds differently to treatment for bipolar disorder. Some signs that your condition is improving include:  Leveling of your mood. You may have less anger and excitement about daily activities, and your low moods may not be as bad.  Your symptoms being less intense.  Feeling calm more often.  Thinking clearly.  Not experiencing consequences for extreme behavior.  Feeling like your life is settling down.  Your behavior seeming more normal to you and to other people.  Some signs that your condition may be getting worse include:  Sleep problems.  Moods cycling between deep lows and unusually high (excess) energy.  Extreme emotions.  More anger at loved ones.  Staying away from others (isolating yourself).  A feeling of power or superiority.  Completing a lot of tasks in a very short amount of time.  Unusual thoughts and behaviors.    Suicidal thoughts.  Where to find support Talking to others  Try making a list of the people you may want to tell about your condition, such as the people you trust most.  Plan what you are willing to talk about and what you do not want to discuss. Think about your needs ahead of time, and how your friends and family  members can support you.  Let your loved ones know when they can share advice and when you would just like them to listen.  Give your loved ones information about bipolar disorder, and encourage them to learn about the condition. Finances Not all insurance plans cover mental health care, so it is important to check with your insurance carrier. If paying for co-pays or counseling services is a problem, search for a local or county mental health care center. Public mental health care services may be offered there at a low cost or no cost when you are not able to see a private health care provider. If you are taking medicine for depression, you may be able to get the generic form, which may be less expensive than brand-name medicine. Some makers of prescription medicines also offer help to patients who cannot afford the medicines they need. Follow these instructions at home: Medicines  Take over-the-counter and prescription medicines only as told by your health care provider or pharmacist.  Ask your pharmacist what over-the-counter cold medicines you should avoid. Some medicines can make symptoms worse. General instructions  Ask for support from trusted family members or friends to make sure you stay on track with your treatment.  Keep a journal to write down your daily moods, medicines, sleep habits, and life events. This may help you have more success with your treatment.  Make and follow a routine for daily meal times. Eat healthy foods, such as whole grains, vegetables, and fresh fruit.  Try to go to sleep and wake up around the same time every day.  Keep all follow-up visits as told by your health care provider. This is important. Questions to ask your health care provider:  If you are taking medicines: ? How long do I need to take medicine? ? Are there any long-term side effects of my medicine? ? Are there any alternatives to taking medicine?  How would I benefit from  therapy?  How often should I follow up with a health care provider? Contact a health care provider if:  Your symptoms get worse or they do not get better with treatment. Get help right away if:  You have thoughts about harming yourself or others. If you ever feel like you may hurt yourself or others, or have thoughts about taking your own life, get help right away. You can go to your nearest emergency department or call:  Your local emergency services (911 in the [place]  A suicide crisis helpline, such as the National Suicide Prevention Lifeline at 1-800-273-8255. This is open 24-hours a day.  Summary  Learning ways to deal with stress can help to calm you and may also help your treatment work better.  There is a wide range of medicines that can help to treat bipolar disorder.  Having healthy relationships can help to make your moods more stable.  Contact a health care provider if your symptoms get worse or they do not get better with treatment. This information is not intended to replace advice given to you by your health care provider. Make sure you discuss any questions you have with your   health care provider. Document Released: 02/20/2017 Document Revised: 02/20/2017 Document Reviewed: 02/20/2017 Elsevier Interactive Patient Education  2018 [employer]  

## 2018-06-12 NOTE — Progress Notes (Signed)
[facility] [address] [place], [place] [address]  Internal MEDICINE  Office Visit Note  Patient Name: Leonard Davidson  [birth year]  [id number]  Date of Service: 06/24/2018  Chief Complaint  Patient presents with  . Annual Exam  . Headache    thinks it may be from injections   Pt is here for routine health maintenance examination.  He reports headaches occasionally that could be attributed to his [place].  His mother is in the exam room, and she is being told by Frederich ChickEaster seals who administers the medication currently to have his PCP prescribe it and give it once a month. She is unsure what the medication will cost when filled on her insurance so she is checking on that before any changes are made.   Headache   This is a new problem. The problem occurs monthly. The problem has been gradually improving. The pain is located in the right unilateral, occipital and temporal region. The pain quality is similar to prior headaches. The quality of the pain is described as aching. The pain is moderate. Pertinent negatives include no abdominal pain, back pain, coughing, eye redness, nausea, neck pain, numbness, rhinorrhea, sore throat or vomiting.   Current Medication: Outpatient Encounter Medications as of 06/12/2018  Medication Sig  . paliperidone (INVEGA SUSTENNA) 234 MG/1.5ML SUSP injection Inject 234 mg into the muscle every 28 (twenty-eight) days.  . divalproex (DEPAKOTE) 500 MG DR tablet Take 3 tablets (1,500 mg total) by mouth at bedtime. (Patient not taking: Reported on 06/12/2018)  . simethicone (MYLICON) 80 MG chewable tablet Chew 2 tablets (160 mg total) by mouth 4 (four) times daily as needed for flatulence. (Patient not taking: Reported on 06/12/2018)  . traZODone (DESYREL) 100 MG tablet Take 1 tablet (100 mg total) by mouth at bedtime as needed for sleep. (Patient not taking: Reported on 06/12/2018)   No facility-administered encounter medications on file as of 06/12/2018.      Surgical History: History reviewed. No pertinent surgical history.  Medical History: Past Medical History:  Diagnosis Date  . Bipolar 1 disorder (HCC)     Family History: Family History  Problem Relation Age of Onset  . Diabetes Father    Review of Systems  Constitutional: Negative.  Negative for chills, fatigue and unexpected weight change.  HENT: Negative.  Negative for congestion, rhinorrhea, sneezing and sore throat.   Eyes: Negative for redness.  Respiratory: Negative.  Negative for cough, chest tightness and shortness of breath.   Cardiovascular: Negative.  Negative for chest pain and palpitations.  Gastrointestinal: Negative.  Negative for abdominal pain, constipation, diarrhea, nausea and vomiting.  Endocrine: Negative.   Genitourinary: Negative.  Negative for dysuria and frequency.  Musculoskeletal: Negative.  Negative for arthralgias, back pain, joint swelling and neck pain.  Skin: Negative.  Negative for rash.  Allergic/Immunologic: Negative.   Neurological: Positive for headaches. Negative for tremors and numbness.  Hematological: Negative for adenopathy. Does not bruise/bleed easily.  Psychiatric/Behavioral: Negative.  Negative for behavioral problems, sleep disturbance and suicidal ideas. The patient is not nervous/anxious.    Vital Signs: BP 122/70   Pulse 77   Resp 16   Ht 6\' 3"  (1.905 m)   Wt 200 lb (90.7 kg)   SpO2 98%   BMI 25.00 kg/m   Physical Exam  Constitutional: He is oriented to person, place, and time. He appears well-developed and well-nourished. No distress.  HENT:  Head: Normocephalic and atraumatic.  Mouth/Throat: Oropharynx is clear and moist. No oropharyngeal  exudate.  Eyes: Pupils are equal, round, and reactive to light. EOM are normal.  Neck: Normal range of motion. Neck supple. No JVD present. No tracheal deviation present. No thyromegaly present.  Cardiovascular: Normal rate, regular rhythm and normal heart sounds. Exam reveals  no gallop and no friction rub.  No murmur heard. Pulmonary/Chest: Effort normal and breath sounds normal. No respiratory distress. He has no wheezes. He has no rales. He exhibits no tenderness.  Abdominal: Soft. There is no tenderness. There is no guarding.  Musculoskeletal: Normal range of motion.  Lymphadenopathy:    He has no cervical adenopathy.  Neurological: He is alert and oriented to person, place, and time. No cranial nerve deficit.  Skin: Skin is warm and dry. He is not diaphoretic.  Psychiatric: He has a normal mood and affect. His behavior is normal. Judgment and thought content normal.  Nursing note and vitals reviewed.  LABS: Recent Results (from the past 2160 hour(s))  UA/M w/rflx Culture, Routine     Status: Abnormal   Collection Time: 06/12/18  2:00 PM  Result Value Ref Range   Specific Gravity, UA 1.028 1.005 - 1.030   pH, UA 7.0 5.0 - 7.5   Color, UA Yellow Yellow   Appearance Ur Clear Clear   Leukocytes, UA Negative Negative   Protein, UA Negative Negative/Trace   Glucose, UA Negative Negative   Ketones, UA Trace (A) Negative   RBC, UA Negative Negative   Bilirubin, UA Negative Negative   Urobilinogen, Ur 1.0 0.2 - 1.0 mg/dL   Nitrite, UA Negative Negative   Microscopic Examination Comment     Comment: Microscopic follows if indicated.   Microscopic Examination See below:     Comment: Microscopic was indicated and was performed.   Urinalysis Reflex Comment     Comment: This specimen will not reflex to a Urine Culture.  Microscopic Examination     Status: None   Collection Time: 06/12/18  2:00 PM  Result Value Ref Range   WBC, UA 0-5 0 - 5 /hpf   RBC, UA 0-2 0 - 2 /hpf   Epithelial Cells (non renal) None seen 0 - 10 /hpf   Casts None seen None seen /lpf   Mucus, UA Present Not Estab.   Bacteria, UA None seen None seen/Few    Assessment/Plan: 1. Encounter for health maintenance examination with abnormal findings Draw labs, will follow results.  -  UA/M w/rflx Culture, Routine - CBC with Differential/Platelet - Lipid Panel With LDL/HDL Ratio - TSH - T4, free - Comprehensive metabolic panel - HIV antibody (with reflex)  2. Bipolar disorder, current episode manic severe with psychotic features (HCC) Pt taking Invega, appears stable. Followed by [facility].     General Counseling: jaleal schliep understanding of the findings of todays visit and agrees with plan of treatment. I have discussed any further diagnostic evaluation that may be needed or ordered today. We also reviewed his medications today. he has been encouraged to call the office with any questions or concerns that should arise related to todays visit.   Orders Placed This Encounter  Procedures  . Microscopic Examination  . UA/M w/rflx Culture, Routine  . CBC with Differential/Platelet  . Lipid Panel With LDL/HDL Ratio  . TSH  . T4, free  . Comprehensive metabolic panel  . HIV antibody (with reflex)    Time spent: 25 Minutes   This patient was seen by Blima Ledger AGNP-C in Collaboration with Dr Lyndon Code as a part  of collaborative care agreement   Lyndon Code, MD  Internal Medicine

## 2018-06-13 LAB — UA/M W/RFLX CULTURE, ROUTINE
BILIRUBIN UA: NEGATIVE
GLUCOSE, UA: NEGATIVE
Leukocytes, UA: NEGATIVE
NITRITE UA: NEGATIVE
Protein, UA: NEGATIVE
RBC, UA: NEGATIVE
SPEC GRAV UA: 1.028 (ref 1.005–1.030)
UUROB: 1 mg/dL (ref 0.2–1.0)
pH, UA: 7 (ref 5.0–7.5)

## 2018-06-13 LAB — MICROSCOPIC EXAMINATION
Bacteria, UA: NONE SEEN
Casts: NONE SEEN /lpf
Epithelial Cells (non renal): NONE SEEN /hpf (ref 0–10)

## 2018-06-26 LAB — LIPID PANEL WITH LDL/HDL RATIO
Cholesterol, Total: 157 mg/dL (ref 100–199)
HDL: 29 mg/dL — ABNORMAL LOW (ref >39)
LDL Calculated: 118 mg/dL — ABNORMAL HIGH (ref 0–99)
LDl/HDL Ratio: 4.1 ratio — ABNORMAL HIGH (ref 0.0–3.6)
Triglycerides: 50 mg/dL (ref 0–149)
VLDL Cholesterol Cal: 10 mg/dL (ref 5–40)

## 2018-06-26 LAB — CBC WITH DIFFERENTIAL/PLATELET
BASOS: 0 %
Basophils Absolute: 0 10*3/uL (ref 0.0–0.2)
EOS (ABSOLUTE): 0.1 10*3/uL (ref 0.0–0.4)
Eos: 2 %
Hematocrit: 43 % (ref 37.5–51.0)
Hemoglobin: 14.1 g/dL (ref 13.0–17.7)
Immature Grans (Abs): 0 10*3/uL (ref 0.0–0.1)
Immature Granulocytes: 0 %
Lymphocytes Absolute: 1.9 10*3/uL (ref 0.7–3.1)
Lymphs: 42 %
MCH: 28.3 pg (ref 26.6–33.0)
MCHC: 32.8 g/dL (ref 31.5–35.7)
MCV: 86 fL (ref 79–97)
MONOS ABS: 0.3 10*3/uL (ref 0.1–0.9)
Monocytes: 6 %
NEUTROS ABS: 2.3 10*3/uL (ref 1.4–7.0)
Neutrophils: 50 %
PLATELETS: 237 10*3/uL (ref 150–450)
RBC: 4.98 x10E6/uL (ref 4.14–5.80)
RDW: 13.6 % (ref 12.3–15.4)
WBC: 4.5 10*3/uL (ref 3.4–10.8)

## 2018-06-26 LAB — COMPREHENSIVE METABOLIC PANEL
A/G RATIO: 1.8 (ref 1.2–2.2)
ALT: 16 IU/L (ref 0–44)
AST: 18 IU/L (ref 0–40)
Albumin: 4.6 g/dL (ref 3.5–5.5)
Alkaline Phosphatase: 69 IU/L (ref 39–117)
BUN/Creatinine Ratio: 13 (ref 9–20)
BUN: 12 mg/dL (ref 6–20)
Bilirubin Total: 0.3 mg/dL (ref 0.0–1.2)
CALCIUM: 9.2 mg/dL (ref 8.7–10.2)
CO2: 21 mmol/L (ref 20–29)
CREATININE: 0.9 mg/dL (ref 0.76–1.27)
Chloride: 104 mmol/L (ref 96–106)
GFR calc Af Amer: 140 mL/min/{1.73_m2} (ref >59)
GFR, EST NON AFRICAN AMERICAN: 121 mL/min/{1.73_m2} (ref >59)
GLUCOSE: 94 mg/dL (ref 65–99)
Globulin, Total: 2.5 g/dL (ref 1.5–4.5)
POTASSIUM: 4.6 mmol/L (ref 3.5–5.2)
Sodium: 141 mmol/L (ref 134–144)
Total Protein: 7.1 g/dL (ref 6.0–8.5)

## 2018-06-26 LAB — HIV ANTIBODY (ROUTINE TESTING W REFLEX): HIV Screen 4th Generation wRfx: NONREACTIVE

## 2018-06-26 LAB — T4, FREE: FREE T4: 1.26 ng/dL (ref 0.82–1.77)

## 2018-06-26 LAB — TSH: TSH: 1.61 u[IU]/mL (ref 0.450–4.500)

## 2018-11-26 ENCOUNTER — Ambulatory Visit: Payer: Managed Care, Other (non HMO) | Admitting: Adult Health

## 2018-11-26 ENCOUNTER — Encounter: Payer: Self-pay | Admitting: Nurse Practitioner

## 2018-11-26 VITALS — BP 144/91 | HR 93 | Temp 97.7°F | Resp 16 | Ht 74.0 in | Wt 215.0 lb

## 2018-11-26 DIAGNOSIS — R059 Cough, unspecified: Secondary | ICD-10-CM

## 2018-11-26 DIAGNOSIS — J029 Acute pharyngitis, unspecified: Secondary | ICD-10-CM | POA: Diagnosis not present

## 2018-11-26 DIAGNOSIS — J0110 Acute frontal sinusitis, unspecified: Secondary | ICD-10-CM | POA: Diagnosis not present

## 2018-11-26 DIAGNOSIS — R05 Cough: Secondary | ICD-10-CM | POA: Diagnosis not present

## 2018-11-26 LAB — POCT INFLUENZA A/B
INFLUENZA A, POC: NEGATIVE
Influenza B, POC: NEGATIVE

## 2018-11-26 MED ORDER — AMOXICILLIN-POT CLAVULANATE 875-125 MG PO TABS: 1.0000 | ORAL_TABLET | Freq: Two times a day (BID) | ORAL | 0 refills | Status: DC

## 2018-11-26 NOTE — Patient Instructions (Signed)

## 2018-11-26 NOTE — Progress Notes (Signed)
[facility] [address] [place], [place] [address]  Internal MEDICINE  Office Visit Note  Patient Name: Leonard Davidson  [id number]  [id number]  Date of Service: 11/26/2018  Chief Complaint  Patient presents with  . Fever    body aches , chills started Tuesday  . Cough  . Headache     HPI Pt is here for a sick visit.  Patient reports approximately 3 days ago he began to notice cough that progressed to body aches and general malaise.  He also reports a sore throat that is worse in the morning and gets better throughout the day.  He reports fever and chills also.  He says he is feeling a little better today however is concerned that he may get worse again so he kept his appointment.     Current Medication:  Outpatient Encounter Medications as of 11/26/2018  Medication Sig  . paliperidone (INVEGA SUSTENNA) 234 MG/1.5ML SUSP injection Inject 234 mg into the muscle every 28 (twenty-eight) days.  Marland Kitchen amoxicillin-clavulanate (AUGMENTIN) 875-125 MG tablet Take 1 tablet by mouth 2 (two) times daily.  . [DISCONTINUED] amoxicillin-clavulanate (AUGMENTIN) 875-125 MG tablet Take 1 tablet by mouth 2 (two) times daily.  . [DISCONTINUED] divalproex (DEPAKOTE) 500 MG DR tablet Take 3 tablets (1,500 mg total) by mouth at bedtime. (Patient not taking: Reported on 06/12/2018)  . [DISCONTINUED] simethicone (MYLICON) 80 MG chewable tablet Chew 2 tablets (160 mg total) by mouth 4 (four) times daily as needed for flatulence. (Patient not taking: Reported on 06/12/2018)  . [DISCONTINUED] traZODone (DESYREL) 100 MG tablet Take 1 tablet (100 mg total) by mouth at bedtime as needed for sleep. (Patient not taking: Reported on 06/12/2018)   No facility-administered encounter medications on file as of 11/26/2018.       Medical History: Past Medical History:  Diagnosis Date  . Bipolar 1 disorder (HCC)      Vital Signs: BP (!) 144/91   Pulse 93   Temp 97.7 F (36.5 C) (Oral)   Resp 16    Ht 6\' 2"  (1.88 m)   Wt 215 lb (97.5 kg)   SpO2 97%   BMI 27.60 kg/m    Review of Systems  Constitutional: Positive for chills, fatigue and fever. Negative for unexpected weight change.  HENT: Positive for congestion and sore throat. Negative for rhinorrhea and sneezing.   Eyes: Negative for redness.  Respiratory: Positive for cough. Negative for chest tightness and shortness of breath.   Cardiovascular: Negative.  Negative for chest pain and palpitations.  Gastrointestinal: Negative.  Negative for abdominal pain, constipation, diarrhea, nausea and vomiting.  Endocrine: Negative.   Genitourinary: Negative.  Negative for dysuria and frequency.  Musculoskeletal: Negative.  Negative for arthralgias, back pain, joint swelling and neck pain.  Skin: Negative.  Negative for rash.  Allergic/Immunologic: Negative.   Neurological: Negative.  Negative for tremors and numbness.  Hematological: Negative for adenopathy. Does not bruise/bleed easily.  Psychiatric/Behavioral: Negative.  Negative for behavioral problems, sleep disturbance and suicidal ideas. The patient is not nervous/anxious.     Physical Exam Vitals signs and nursing note reviewed.  Constitutional:      General: He is not in acute distress.    Appearance: He is well-developed. He is not diaphoretic.  HENT:     Head: Normocephalic and atraumatic.     Mouth/Throat:     Pharynx: No oropharyngeal exudate.  Eyes:     Pupils: Pupils are equal, round, and reactive to light.  Neck:  Musculoskeletal: Normal range of motion and neck supple.     Thyroid: No thyromegaly.     Vascular: No JVD.     Trachea: No tracheal deviation.  Cardiovascular:     Rate and Rhythm: Normal rate and regular rhythm.     Heart sounds: Normal heart sounds. No murmur. No friction rub. No gallop.   Pulmonary:     Effort: Pulmonary effort is normal. No respiratory distress.     Breath sounds: Normal breath sounds. No wheezing or rales.  Chest:      Chest wall: No tenderness.  Abdominal:     Palpations: Abdomen is soft.     Tenderness: There is no abdominal tenderness. There is no guarding.  Musculoskeletal: Normal range of motion.  Lymphadenopathy:     Cervical: No cervical adenopathy.  Skin:    General: Skin is warm and dry.  Neurological:     Mental Status: He is alert and oriented to person, place, and time.     Cranial Nerves: No cranial nerve deficit.  Psychiatric:        Behavior: Behavior normal.        Thought Content: Thought content normal.        Judgment: Judgment normal.    Assessment/Plan: 1. Acute non-recurrent frontal sinusitis Patient provided with course of Augmentin.  Instructed patient to take all medication until completion.  Also instructed patient that if he is not better within 7 to 10 days he should return to clinic. - amoxicillin-clavulanate (AUGMENTIN) 875-125 MG tablet; Take 1 tablet by mouth 2 (two) times daily.  Dispense: 14 tablet; Refill: 0  2. Sore throat Flu negative at this visit. - POCT Influenza A/B  3. Cough Most likely due to drainage from sinuses.  General Counseling: Leonard Davidson understanding of the findings of todays visit and agrees with plan of treatment. I have discussed any further diagnostic evaluation that may be needed or ordered today. We also reviewed his medications today. he has been encouraged to call the office with any questions or concerns that should arise related to todays visit.   Orders Placed This Encounter  Procedures  . POCT Influenza A/B    Meds ordered this encounter  Medications  . DISCONTD: amoxicillin-clavulanate (AUGMENTIN) 875-125 MG tablet    Sig: Take 1 tablet by mouth 2 (two) times daily.    Dispense:  14 tablet    Refill:  0  . amoxicillin-clavulanate (AUGMENTIN) 875-125 MG tablet    Sig: Take 1 tablet by mouth 2 (two) times daily.    Dispense:  14 tablet    Refill:  0    Time spent: 25 Minutes  This patient was seen by Blima Ledger AGNP-C in Collaboration with Dr Lyndon Code as a part of collaborative care agreement.  Johnna Acosta AGNP-C Internal Medicine

## 2019-06-18 ENCOUNTER — Ambulatory Visit: Payer: Self-pay | Admitting: Nurse Practitioner

## 2019-07-28 ENCOUNTER — Encounter: Payer: Self-pay | Admitting: Nurse Practitioner

## 2019-11-18 ENCOUNTER — Telehealth: Payer: Self-pay

## 2019-11-18 NOTE — Telephone Encounter (Signed)
RECORD REQUEST COMPLETED AND FAXED TO JESSIE DAUGHTRY WITH DDS AND ALSO PLACED IN MAIL ON 11-18-19. COPY IN [facility]

## 2020-02-07 ENCOUNTER — Ambulatory Visit: Payer: Self-pay | Attending: Internal Medicine

## 2020-02-07 DIAGNOSIS — Z23 Encounter for immunization: Secondary | ICD-10-CM

## 2020-02-07 NOTE — Progress Notes (Signed)
   Covid-19 Vaccination Clinic  Name:  Leonard Davidson    MRN: [id number] DOB: 08-28-1996  02/07/2020  Leonard Davidson was observed post Covid-19 immunization for 15 minutes without incident. He was provided with Vaccine Information Sheet and instruction to access the V-Safe system.   Leonard Davidson was instructed to call 911 with any severe reactions post vaccine: Marland Kitchen Difficulty breathing  . Swelling of face and throat  . A fast heartbeat  . A bad rash all over body  . Dizziness and weakness   Immunizations Administered    Name Date Dose VIS Date Route   Pfizer COVID-19 Vaccine 02/07/2020  2:09 PM 0.3 mL 10/15/2019 Intramuscular   Manufacturer: [employer], [employer]   Lot: [id number]   NDC: [address]-7

## 2020-03-07 ENCOUNTER — Ambulatory Visit: Payer: Self-pay | Attending: Internal Medicine

## 2020-03-07 DIAGNOSIS — Z23 Encounter for immunization: Secondary | ICD-10-CM

## 2020-03-07 NOTE — Progress Notes (Signed)
   Covid-19 Vaccination Clinic  Name:  Leonard Davidson    MRN: [id number] DOB: [birth year]  03/07/2020  Mr. [name] was observed post Covid-19 immunization for 15 minutes without incident. He was provided with Vaccine Information Sheet and instruction to access the V-Safe system.   Mr. [name] was instructed to call 911 with any severe reactions post vaccine: Marland Kitchen Difficulty breathing  . Swelling of face and throat  . A fast heartbeat  . A bad rash all over body  . Dizziness and weakness   Immunizations Administered    Name Date Dose VIS Date Route   Pfizer COVID-19 Vaccine 03/07/2020  9:53 AM 0.3 mL 12/29/2018 Intramuscular   Manufacturer: [employer], [employer]   Lot: [id number]   NDC: [address]-7

## 2020-05-25 ENCOUNTER — Ambulatory Visit: Payer: Managed Care, Other (non HMO) | Admitting: Adult Health

## 2020-05-25 ENCOUNTER — Other Ambulatory Visit: Payer: Self-pay

## 2020-05-25 ENCOUNTER — Encounter: Payer: Self-pay | Admitting: Adult Health

## 2020-05-25 VITALS — Resp 16 | Ht 74.0 in | Wt 210.0 lb

## 2020-05-25 DIAGNOSIS — J988 Other specified respiratory disorders: Secondary | ICD-10-CM | POA: Diagnosis not present

## 2020-05-25 DIAGNOSIS — R05 Cough: Secondary | ICD-10-CM

## 2020-05-25 DIAGNOSIS — R059 Cough, unspecified: Secondary | ICD-10-CM

## 2020-05-25 MED ORDER — AZITHROMYCIN 250 MG PO TABS: ORAL_TABLET | ORAL | 0 refills | Status: DC

## 2020-05-25 MED ORDER — GUAIFENESIN-CODEINE 100-10 MG/5ML PO SYRP: 5.0000 mL | ORAL_SOLUTION | Freq: Every evening | ORAL | 0 refills | Status: DC | PRN

## 2020-05-25 NOTE — Progress Notes (Signed)
[facility] [address] [place], [place] [address]  Internal MEDICINE  Telephone Visit  Patient Name: Leonard Davidson  [id number]  [id number]  Date of Service: 05/25/2020  I connected with the patient at  200 by telephone and verified the patients identity using two identifiers.   I discussed the limitations, risks, security and privacy concerns of performing an evaluation and management service by telephone and the availability of in person appointments. I also discussed with the patient that there may be a patient responsible charge related to the service.  The patient expressed understanding and agrees to proceed.    Chief Complaint  Patient presents with  . Telephone Screen    congestion, sneezing  . Telephone Assessment  . Cough    HPI PT seen via telephone.  He reports coughing, sneezing and chest congestion x a few weeks.  He has not had a covid test, but he has had the vaccines. He reports the cough is worse at night, and is making it difficult to sleep.     Current Medication: Outpatient Encounter Medications as of 05/25/2020  Medication Sig  . paliperidone (INVEGA SUSTENNA) 234 MG/1.5ML SUSP injection Inject 234 mg into the muscle every 28 (twenty-eight) days.  . [DISCONTINUED] amoxicillin-clavulanate (AUGMENTIN) 875-125 MG tablet Take 1 tablet by mouth 2 (two) times daily.  Marland Kitchen azithromycin (ZITHROMAX) 250 MG tablet Take as directed.  Marland Kitchen guaiFENesin-codeine (ROBITUSSIN AC) 100-10 MG/5ML syrup Take 5 mLs by mouth at bedtime as needed for cough.   No facility-administered encounter medications on file as of 05/25/2020.    Surgical History: History reviewed. No pertinent surgical history.  Medical History: Past Medical History:  Diagnosis Date  . Bipolar 1 disorder (HCC)     Family History: Family History  Problem Relation Age of Onset  . Diabetes Father     Social History   Socioeconomic History  . Marital status: Single    Spouse name: Not  on file  . Number of children: Not on file  . Years of education: Not on file  . Highest education level: Not on file  Occupational History  . Not on file  Tobacco Use  . Smoking status: Never Smoker  . Smokeless tobacco: Never Used  Vaping Use  . Vaping Use: Never used  Substance and Sexual Activity  . Alcohol use: Yes    Comment: sometimes  . Drug use: Not Currently  . Sexual activity: Not on file  Other Topics Concern  . Not on file  Social History Narrative  . Not on file   Social Determinants of Health   Financial Resource Strain:   . Difficulty of Paying Living Expenses:   Food Insecurity:   . Worried About Programme researcher, broadcasting/film/video in the Last Year:   . Barista in the Last Year:   Transportation Needs:   . Freight forwarder (Medical):   Marland Kitchen Lack of Transportation (Non-Medical):   Physical Activity:   . Days of Exercise per Week:   . Minutes of Exercise per Session:   Stress:   . Feeling of Stress :   Social Connections:   . Frequency of Communication with Friends and Family:   . Frequency of Social Gatherings with Friends and Family:   . Attends Religious Services:   . Active Member of Clubs or Organizations:   . Attends Banker Meetings:   Marland Kitchen Marital Status:   Intimate Partner Violence:   . Fear of Current or Ex-Partner:   .  Emotionally Abused:   Marland Kitchen Physically Abused:   . Sexually Abused:       Review of Systems  Constitutional: Negative.  Negative for chills, fatigue and unexpected weight change.  HENT: Negative.  Negative for congestion, rhinorrhea, sneezing and sore throat.   Eyes: Negative for redness.  Respiratory: Negative.  Negative for cough, chest tightness and shortness of breath.   Cardiovascular: Negative.  Negative for chest pain and palpitations.  Gastrointestinal: Negative.  Negative for abdominal pain, constipation, diarrhea, nausea and vomiting.  Endocrine: Negative.   Genitourinary: Negative.  Negative for dysuria  and frequency.  Musculoskeletal: Negative.  Negative for arthralgias, back pain, joint swelling and neck pain.  Skin: Negative.  Negative for rash.  Allergic/Immunologic: Negative.   Neurological: Negative.  Negative for tremors and numbness.  Hematological: Negative for adenopathy. Does not bruise/bleed easily.  Psychiatric/Behavioral: Negative.  Negative for behavioral problems, sleep disturbance and suicidal ideas. The patient is not nervous/anxious.     Vital Signs: Resp 16   Ht 6\' 2"  (1.88 m)   Wt (!) 210 lb (95.3 kg)   BMI 26.96 kg/m    Observation/Objective:  Well sounding, some coughing noted.    Assessment/Plan: 1. Respiratory infection Advised patient to take entire course of antibiotics as prescribed with food. Pt should return to clinic in 7-10 days if symptoms fail to improve or new symptoms develop.  - azithromycin (ZITHROMAX) 250 MG tablet; Take as directed.  Dispense: 6 tablet; Refill: 0  2. Cough Reviewed risks and possible side effects associated with taking opiates, benzodiazepines and other CNS depressants. Combination of these could cause dizziness and drowsiness. Advised patient not to drive or operate machinery when taking these medications, as patient's and other's life can be at risk and will have consequences. Patient verbalized understanding in this matter. Dependence and abuse for these drugs will be monitored closely. A Controlled substance policy and procedure is on file which allows [facility] to order a urine drug screen test at any visit. Patient understands and agrees with the plan - guaiFENesin-codeine (ROBITUSSIN AC) 100-10 MG/5ML syrup; Take 5 mLs by mouth at bedtime as needed for cough.  Dispense: 70 mL; Refill: 0  General Counseling: Monte Gil understanding of the findings of today's phone visit and agrees with plan of treatment. I have discussed any further diagnostic evaluation that may be needed or ordered today. We also  reviewed his medications today. he has been encouraged to call the office with any questions or concerns that should arise related to todays visit.    No orders of the defined types were placed in this encounter.   Meds ordered this encounter  Medications  . azithromycin (ZITHROMAX) 250 MG tablet    Sig: Take as directed.    Dispense:  6 tablet    Refill:  0  . guaiFENesin-codeine (ROBITUSSIN AC) 100-10 MG/5ML syrup    Sig: Take 5 mLs by mouth at bedtime as needed for cough.    Dispense:  70 mL    Refill:  0    Time spent: 25 Minutes    Lorenda Cahill AGNP-C Internal medicine

## 2020-05-28 ENCOUNTER — Ambulatory Visit
Admission: RE | Admit: 2020-05-28 | Discharge: 2020-05-28 | Disposition: A | Discharge status: Home / Self Care | Payer: Managed Care, Other (non HMO) | Source: Ambulatory Visit | Attending: Family Medicine | Admitting: Family Medicine

## 2020-05-28 ENCOUNTER — Other Ambulatory Visit: Payer: Self-pay

## 2020-05-28 VITALS — BP 138/76 | HR 83 | Temp 98.2°F | Resp 16 | Ht 75.0 in | Wt 210.0 lb

## 2020-05-28 DIAGNOSIS — Z20822 Contact with and (suspected) exposure to covid-19: Secondary | ICD-10-CM | POA: Diagnosis not present

## 2020-05-28 DIAGNOSIS — F319 Bipolar disorder, unspecified: Secondary | ICD-10-CM | POA: Diagnosis not present

## 2020-05-28 DIAGNOSIS — J988 Other specified respiratory disorders: Secondary | ICD-10-CM

## 2020-05-28 DIAGNOSIS — B9789 Other viral agents as the cause of diseases classified elsewhere: Secondary | ICD-10-CM | POA: Diagnosis present

## 2020-05-28 DIAGNOSIS — Z79899 Other long term (current) drug therapy: Secondary | ICD-10-CM | POA: Diagnosis not present

## 2020-05-28 MED ORDER — IPRATROPIUM BROMIDE 0.06 % NA SOLN: 2.0000 | Freq: Four times a day (QID) | NASAL | 0 refills | Status: DC | PRN

## 2020-05-28 MED ORDER — CETIRIZINE-PSEUDOEPHEDRINE ER 5-120 MG PO TB12: 1.0000 | ORAL_TABLET | Freq: Two times a day (BID) | ORAL | 0 refills | Status: DC

## 2020-05-28 NOTE — ED Provider Notes (Signed)
[facility]    CSN: [id number] Arrival date & time: 05/28/20  1334  History   Chief Complaint Chief Complaint  Patient presents with   Cough   Generalized Body Aches   HPI  24 year old presents with cough, congestion.  ~2 week history of cough, congestion. Also reports some body aches. No fever. No SOB. Prescribed Azithromycin on 7/22. Was also given cough medication. Reports persistent symptoms. No reported sick contacts. No other complaints.   Past Medical History:  Diagnosis Date   Bipolar 1 disorder [facility])     Patient Active Problem List   Diagnosis Date Noted   Noncompliance 09/02/2016   Cannabis use disorder, severe, dependence (HCC) 08/16/2016   Bipolar disorder, current episode manic severe with psychotic features (HCC) 08/16/2016    History reviewed. No pertinent surgical history.     Home Medications    Prior to Admission medications   Medication Sig Start Date End Date Taking? Authorizing Provider  guaiFENesin-codeine (ROBITUSSIN AC) 100-10 MG/5ML syrup Take 5 mLs by mouth at bedtime as needed for cough. 05/25/20  Yes Scarboro, Coralee North, NP  paliperidone (INVEGA SUSTENNA) 234 MG/1.5ML SUSP injection Inject 234 mg into the muscle every 28 (twenty-eight) days. 10/04/16  Yes Pucilowska, Jolanta B, MD  cetirizine-pseudoephedrine (ZYRTEC-D) 5-120 MG tablet Take 1 tablet by mouth 2 (two) times daily. 05/28/20   Tommie Sams, DO  ipratropium (ATROVENT) 0.06 % nasal spray Place 2 sprays into both nostrils 4 (four) times daily as needed for rhinitis. 05/28/20   Tommie Sams, DO    Family History Family History  Problem Relation Age of Onset   Diabetes Father     Social History Social History   Tobacco Use   Smoking status: Never Smoker   Smokeless tobacco: Never Used  Building services engineer Use: Never used  Substance Use Topics   Alcohol use: Yes    Comment: sometimes   Drug use: Not Currently     Allergies   Patient has no known  allergies.   Review of Systems Review of Systems  Constitutional: Negative for fever.  Respiratory: Positive for cough.    Physical Exam Triage Vital Signs ED Triage Vitals  Enc Vitals Group     BP 05/28/20 1353 (!) 138/76     Pulse Rate 05/28/20 1353 83     Resp 05/28/20 1353 16     Temp 05/28/20 1353 98.2 F (36.8 C)     Temp Source 05/28/20 1353 Oral     SpO2 05/28/20 1353 97 %     Weight 05/28/20 1349 (!) 210 lb (95.3 kg)     Height 05/28/20 1349 6\' 3"  (1.905 m)     Head Circumference --      Peak Flow --      Pain Score 05/28/20 1349 7     Pain Loc --      Pain Edu? --      Excl. in GC? --    Updated Vital Signs BP (!) 138/76 (BP Location: Left Arm)    Pulse 83    Temp 98.2 F (36.8 C) (Oral)    Resp 16    Ht 6\' 3"  (1.905 m)    Wt (!) 95.3 kg    SpO2 97%    BMI 26.25 kg/m   Visual Acuity Right Eye Distance:   Left Eye Distance:   Bilateral Distance:    Right Eye Near:   Left Eye Near:    Bilateral Near:  Physical Exam Vitals and nursing note reviewed.  Constitutional:      General: He is not in acute distress.    Appearance: Normal appearance. He is not ill-appearing.  HENT:     Head: Normocephalic and atraumatic.     Right Ear: Tympanic membrane normal.     Left Ear: Tympanic membrane normal.     Mouth/Throat:     Pharynx: Oropharynx is clear. No posterior oropharyngeal erythema.  Eyes:     General:        Right eye: No discharge.        Left eye: No discharge.     Conjunctiva/sclera: Conjunctivae normal.  Cardiovascular:     Rate and Rhythm: Normal rate and regular rhythm.  Pulmonary:     Effort: Pulmonary effort is normal.     Breath sounds: Normal breath sounds. No wheezing or rales.  Neurological:     Mental Status: He is alert.  Psychiatric:        Mood and Affect: Mood normal.        Behavior: Behavior normal.    UC Treatments / Results  Labs (all labs ordered are listed, but only abnormal results are displayed) Labs Reviewed    SARS CORONAVIRUS 2 (TAT 6-24 HRS)    EKG   Radiology No results found.  Procedures Procedures (including critical care time)  Medications Ordered in UC Medications - No data to display  Initial Impression / Assessment and Plan / UC Course  I have reviewed the triage vital signs and the nursing notes.  Pertinent labs & imaging results that were available during my care of the patient were reviewed by me and considered in my medical decision making (see chart for details).    24 year old male presents with a respiratory infection. No evidence of bacterial infection at this time and has recently been placed on antibiotic. Advised supportive care. Zyrtec D and atrovent as prescribed. Follow up with PCP.  Final Clinical Impressions(s) / UC Diagnoses   Final diagnoses:  Viral respiratory infection     Discharge Instructions     Rest. Fluids.  Results available in 24 hours.  Medication as prescribed.  Stay home.  Take care  Dr. Adriana Simas    ED Prescriptions    Medication Sig Dispense Auth. Provider   ipratropium (ATROVENT) 0.06 % nasal spray  (Status: Discontinued) Place 2 sprays into both nostrils 4 (four) times daily as needed for rhinitis. 15 mL Elizabella Nolet G, DO   cetirizine-pseudoephedrine (ZYRTEC-D) 5-120 MG tablet  (Status: Discontinued) Take 1 tablet by mouth 2 (two) times daily. 30 tablet Delwin Raczkowski G, DO   cetirizine-pseudoephedrine (ZYRTEC-D) 5-120 MG tablet Take 1 tablet by mouth 2 (two) times daily. 30 tablet Tasharra Nodine G, DO   ipratropium (ATROVENT) 0.06 % nasal spray Place 2 sprays into both nostrils 4 (four) times daily as needed for rhinitis. 15 mL Tommie Sams, DO     PDMP not reviewed this encounter.   Tommie Sams, [place] 05/28/20 2215

## 2020-05-28 NOTE — Discharge Instructions (Signed)
Rest. Fluids.  Results available in 24 hours.  Medication as prescribed.  Stay home.  Take care  Dr. Adriana Simas

## 2020-05-28 NOTE — ED Triage Notes (Signed)
Patient c/o cough, chest congestion, and bodyaches for a week.  Patient denies fevers. Patient states that he finished Z-Pack this morning.  Patient states that his cough has not improved.

## 2020-05-29 LAB — SARS CORONAVIRUS 2 (TAT 6-24 HRS): SARS Coronavirus 2: NEGATIVE

## 2020-05-31 ENCOUNTER — Telehealth: Payer: Self-pay | Admitting: Emergency Medicine

## 2020-05-31 MED ORDER — BENZONATATE 100 MG PO CAPS
100.0000 mg | ORAL_CAPSULE | Freq: Three times a day (TID) | ORAL | 0 refills | Status: DC | PRN
Start: 2020-05-31 — End: 2020-06-02

## 2020-05-31 NOTE — Telephone Encounter (Signed)
Patient seen in urgent care on 05/28/2020 for viral upper respiratory infection.  Per nursing staff patient called today requesting cough medication.  Tessalon Perle sent to patient pharmacy.

## 2020-06-02 ENCOUNTER — Other Ambulatory Visit: Payer: Self-pay | Admitting: Nurse Practitioner

## 2020-06-02 ENCOUNTER — Other Ambulatory Visit: Payer: Self-pay | Admitting: Adult Health

## 2020-06-02 ENCOUNTER — Telehealth: Payer: Self-pay

## 2020-06-02 DIAGNOSIS — R059 Cough, unspecified: Secondary | ICD-10-CM

## 2020-06-02 MED ORDER — BENZONATATE 100 MG PO CAPS: 100.0000 mg | ORAL_CAPSULE | Freq: Three times a day (TID) | ORAL | 0 refills | Status: DC | PRN

## 2020-06-02 NOTE — Telephone Encounter (Signed)
LOV: 05/25/20 video/phone visit NOV: 06/22/20 phy w/Heather

## 2020-06-08 ENCOUNTER — Other Ambulatory Visit: Payer: Self-pay

## 2020-06-08 ENCOUNTER — Encounter: Payer: Self-pay | Admitting: Nurse Practitioner

## 2020-06-08 ENCOUNTER — Ambulatory Visit: Payer: Managed Care, Other (non HMO) | Admitting: Hospice and Palliative Medicine

## 2020-06-08 DIAGNOSIS — R05 Cough: Secondary | ICD-10-CM | POA: Diagnosis not present

## 2020-06-08 DIAGNOSIS — J988 Other specified respiratory disorders: Secondary | ICD-10-CM

## 2020-06-08 DIAGNOSIS — R059 Cough, unspecified: Secondary | ICD-10-CM

## 2020-06-08 MED ORDER — GUAIFENESIN-DM 100-10 MG/5ML PO SYRP
5.0000 mL | ORAL_SOLUTION | ORAL | 0 refills | Status: DC | PRN
Start: 2020-06-08 — End: 2020-06-08

## 2020-06-08 MED ORDER — GUAIFENESIN-DM 100-10 MG/5ML PO SYRP: 5.0000 mL | ORAL_SOLUTION | ORAL | 0 refills | Status: DC | PRN

## 2020-06-08 NOTE — Progress Notes (Signed)
[facility] [address] [place], [place] [address]  Internal MEDICINE  Telephone Visit  Patient Name: Leonard Davidson  [id number]  [id number]  Date of Service: 06/08/2020  I connected with the patient at 2:30 by telephone and verified the patients identity using two identifiers.   I discussed the limitations, risks, security and privacy concerns of performing an evaluation and management service by telephone and the availability of in person appointments. I also discussed with the patient that there may be a patient responsible charge related to the service.  The patient expressed understanding and agrees to proceed.    Chief Complaint  Patient presents with  . Telephone Screen    covid  test negative  . Telephone Assessment  . Cough  . Sinusitis    HPI Patient seen via telephone. He reports that he is continuing to cough which has been ongoing for several weeks to a month. Was recently seen in the ED in July for cough. At that time they tested him for COVID as well as influenza, both of which are negative. He reports that his cough is worse at night. Has had both COVID vaccines. Also complains of congestion and body aches. He has finished a course of azithromycin. Has not noticed significant improvement in his symptoms. Reports he is coughing up phlegm but it is thick and at times is hard for him to cough up. Does not report having any allergies or seasonal allergies in the past. Has completed a course of cough suppressant with codeine, discussed with him at this time I am unable to refill the codeine cough suppressant. Denies fevers.  Current Medication: Outpatient Encounter Medications as of 06/08/2020  Medication Sig  . benzonatate (TESSALON PERLES) 100 MG capsule Take 1 capsule (100 mg total) by mouth 3 (three) times daily as needed for cough.  . cetirizine-pseudoephedrine (ZYRTEC-D) 5-120 MG tablet Take 1 tablet by mouth 2 (two) times daily.  Marland Kitchen ipratropium  (ATROVENT) 0.06 % nasal spray Place 2 sprays into both nostrils 4 (four) times daily as needed for rhinitis.  . paliperidone (INVEGA SUSTENNA) 234 MG/1.5ML SUSP injection Inject 234 mg into the muscle every 28 (twenty-eight) days.  Marland Kitchen guaiFENesin-codeine 100-10 MG/5ML syrup TAKE 5 MLS BY MOUTH AT BEDTIME AS NEEDED FOR COUGH. (Patient not taking: Reported on 06/08/2020)  . [DISCONTINUED] guaiFENesin-dextromethorphan (ROBITUSSIN DM) 100-10 MG/5ML syrup Take 5 mLs by mouth every 4 (four) hours as needed for cough.   No facility-administered encounter medications on file as of 06/08/2020.    Surgical History: History reviewed. No pertinent surgical history.  Medical History: Past Medical History:  Diagnosis Date  . Bipolar 1 disorder (HCC)     Family History: Family History  Problem Relation Age of Onset  . Diabetes Father     Social History   Socioeconomic History  . Marital status: Single    Spouse name: Not on file  . Number of children: Not on file  . Years of education: Not on file  . Highest education level: Not on file  Occupational History  . Not on file  Tobacco Use  . Smoking status: Never Smoker  . Smokeless tobacco: Never Used  Vaping Use  . Vaping Use: Never used  Substance and Sexual Activity  . Alcohol use: Yes    Comment: sometimes  . Drug use: Not Currently  . Sexual activity: Not on file  Other Topics Concern  . Not on file  Social History Narrative  . Not on file  Social Determinants of Health   Financial Resource Strain:   . Difficulty of Paying Living Expenses:   Food Insecurity:   . Worried About Programme researcher, broadcasting/film/video in the Last Year:   . Barista in the Last Year:   Transportation Needs:   . Freight forwarder (Medical):   Marland Kitchen Lack of Transportation (Non-Medical):   Physical Activity:   . Days of Exercise per Week:   . Minutes of Exercise per Session:   Stress:   . Feeling of Stress :   Social Connections:   . Frequency of  Communication with Friends and Family:   . Frequency of Social Gatherings with Friends and Family:   . Attends Religious Services:   . Active Member of Clubs or Organizations:   . Attends Banker Meetings:   Marland Kitchen Marital Status:   Intimate Partner Violence:   . Fear of Current or Ex-Partner:   . Emotionally Abused:   Marland Kitchen Physically Abused:   . Sexually Abused:       Review of Systems  Constitutional: Positive for fatigue.       Negative for chills, fever.  HENT: Positive for congestion, rhinorrhea and sinus pressure.        Negative for sore throat, trouble swallowing.  Eyes: Negative.        For changes in vision or visual disturbances.  Respiratory: Positive for cough.        Negative for chest tightness, shortness of breath, wheezing.  Cardiovascular: Negative.        For chest pain, ankle swelling, palpitations.  Gastrointestinal: Negative.        For abdominal pain, constipation, nausea, vomiting, diarrhea.  Endocrine: Negative.        For polydipsia, polyphagia, polyuria.  Genitourinary: Negative.        For dysuria, flank pain, hematuria, increased frequency, urgency.  Musculoskeletal: Negative.        For arthralgias, myalgias, back pain, neck pain, gait disturbances.  Skin:       For rash, wound.  Allergic/Immunologic: Negative.   Neurological: Negative.        For dizziness, headaches, tremors, weakness.  Hematological: Negative.   Psychiatric/Behavioral: Negative.        For confusion, depression, anxiety, sleep disturbances.    Vital Signs: Resp 16   Ht 6\' 2"  (1.88 m)   Wt 215 lb (97.5 kg)   BMI 27.60 kg/m    Observation/Objective: Did not hear any coughing or sinus congestion while on phone.  Assessment/Plan: 1. Cough in adult Will obtain chest x-ray for continued cough. COVID and influenza negative in July. May need repeat COVID testing if symptoms continue to persist. - DG Chest 2 View; Future - DG Chest 2 View  2. Respiratory  infection Continues to have cough, worse at night, sinus congestion as well as body aches. Has completed Z-pack recently. No further indication of antibiotic therapy at this time. Has completed course of codeine cough suppressant. Ordered cough suppressant without codeine at this time. Encouraged to increase his fluid intake. -guaifenesin-dextromethorphan August every 4 hours as needed for cough. 0 refills   General Counseling: rasaan brotherton understanding of the findings of today's phone visit and agrees with plan of treatment. I have discussed any further diagnostic evaluation that may be needed or ordered today. We also reviewed his medications today. he has been encouraged to call the office with any questions or concerns that should arise related to todays visit.  Orders Placed This Encounter  Procedures  . DG Chest 2 View    Meds ordered this encounter  Medications  . DISCONTD: guaiFENesin-dextromethorphan (ROBITUSSIN DM) 100-10 MG/5ML syrup    Sig: Take 5 mLs by mouth every 4 (four) hours as needed for cough.    Dispense:  118 mL    Refill:  0     Time spent:  This patient was seen today by Leeanne Deed, AGNP-C in collaboration with Dr. Lyndon Code as part of a collaborative care agreement.  Lubertha Basque Shareese Macha AGNP-C Internal medicine

## 2020-06-09 ENCOUNTER — Ambulatory Visit
Admission: RE | Admit: 2020-06-09 | Discharge: 2020-06-09 | Disposition: A | Discharge status: Home / Self Care | Payer: Managed Care, Other (non HMO) | Attending: Nurse Practitioner | Admitting: Nurse Practitioner

## 2020-06-09 ENCOUNTER — Ambulatory Visit
Admission: RE | Admit: 2020-06-09 | Discharge: 2020-06-09 | Disposition: A | Discharge status: Home / Self Care | Payer: Managed Care, Other (non HMO) | Source: Ambulatory Visit | Attending: Hospice and Palliative Medicine | Admitting: Hospice and Palliative Medicine

## 2020-06-09 ENCOUNTER — Other Ambulatory Visit: Payer: Self-pay

## 2020-06-09 DIAGNOSIS — R0600 Dyspnea, unspecified: Secondary | ICD-10-CM | POA: Diagnosis present

## 2020-06-09 DIAGNOSIS — R05 Cough: Secondary | ICD-10-CM | POA: Diagnosis present

## 2020-06-12 ENCOUNTER — Other Ambulatory Visit: Payer: Self-pay

## 2020-06-12 ENCOUNTER — Telehealth: Payer: Self-pay

## 2020-06-12 ENCOUNTER — Other Ambulatory Visit: Payer: Self-pay | Admitting: Internal Medicine

## 2020-06-12 MED ORDER — PREDNISONE 5 MG PO TABS
ORAL_TABLET | ORAL | 0 refills | Status: DC
Start: 2020-06-12 — End: 2020-06-12

## 2020-06-12 MED ORDER — PREDNISONE 5 MG PO TABS: ORAL_TABLET | ORAL | 0 refills | Status: DC

## 2020-06-12 NOTE — Telephone Encounter (Signed)
Spoke with pt and make appt tomorrow  

## 2020-06-12 NOTE — Telephone Encounter (Signed)
Sent prednisone

## 2020-06-12 NOTE — Telephone Encounter (Signed)
Pt came in to pick up samples of dylsum and Asmatex inhaler per [facility]

## 2020-06-12 NOTE — Telephone Encounter (Signed)
Can be seen with dfk in am

## 2020-06-13 ENCOUNTER — Ambulatory Visit: Payer: Self-pay | Admitting: Hospice and Palliative Medicine

## 2020-06-20 ENCOUNTER — Telehealth: Payer: Self-pay

## 2020-06-20 NOTE — Telephone Encounter (Signed)
Confirmed and screened for 06-22-20 ov. 

## 2020-06-22 ENCOUNTER — Encounter: Payer: Self-pay | Admitting: Nurse Practitioner

## 2020-06-22 ENCOUNTER — Ambulatory Visit (INDEPENDENT_AMBULATORY_CARE_PROVIDER_SITE_OTHER): Payer: Managed Care, Other (non HMO) | Admitting: Nurse Practitioner

## 2020-06-22 ENCOUNTER — Other Ambulatory Visit: Payer: Self-pay

## 2020-06-22 VITALS — BP 140/81 | HR 78 | Temp 97.6°F | Resp 16 | Ht 74.0 in | Wt 209.6 lb

## 2020-06-22 DIAGNOSIS — Z0001 Encounter for general adult medical examination with abnormal findings: Secondary | ICD-10-CM

## 2020-06-22 DIAGNOSIS — R300 Dysuria: Secondary | ICD-10-CM | POA: Diagnosis not present

## 2020-06-22 DIAGNOSIS — F312 Bipolar disorder, current episode manic severe with psychotic features: Secondary | ICD-10-CM

## 2020-06-22 DIAGNOSIS — J069 Acute upper respiratory infection, unspecified: Secondary | ICD-10-CM | POA: Diagnosis not present

## 2020-06-22 MED ORDER — AMOXICILLIN-POT CLAVULANATE 875-125 MG PO TABS: 1.0000 | ORAL_TABLET | Freq: Two times a day (BID) | ORAL | 0 refills | Status: DC

## 2020-06-22 NOTE — Progress Notes (Signed)
[facility] [address] [place], [place] [address]  Internal MEDICINE  Office Visit Note  Patient Name: Leonard Davidson  [id number]  [id number]  Date of Service: 07/05/2020   Pt is here for routine health maintenance examination   Chief Complaint  Patient presents with  . Annual Exam    cough x 3weeks, still has cough after he took meds  . Quality Metric Gaps    HepC     The patient presents for health maintenance exam. Today, he states that he has nasal congestion, cough, headache, and scratchy throat. Symptoms have been going on for several days. Has has tried taking OTC mediations to relieve symptoms, but they have not helped. He was seen on 06/08/2020 and treated for cough and mild reactive airway disease. He states that medications prescribed at that visit have not helped. He did have a chest x-ray 06/08/2020 which was negative for acute cardiopulmonary disease. He was tested for COVID 19 05/28/2020 when symptoms first started. Results were negative.  The patient is due to have routine labs done. He does see psychiatry routinely. His mental health symptoms are currently well managed.   Current Medication: Outpatient Encounter Medications as of 06/22/2020  Medication Sig  . benzonatate (TESSALON PERLES) 100 MG capsule Take 1 capsule (100 mg total) by mouth 3 (three) times daily as needed for cough.  Marland Kitchen ipratropium (ATROVENT) 0.06 % nasal spray Place 2 sprays into both nostrils 4 (four) times daily as needed for rhinitis.  . paliperidone (INVEGA SUSTENNA) 234 MG/1.5ML SUSP injection Inject 234 mg into the muscle every 28 (twenty-eight) days.  . [DISCONTINUED] cetirizine-pseudoephedrine (ZYRTEC-D) 5-120 MG tablet Take 1 tablet by mouth 2 (two) times daily.  . [DISCONTINUED] guaiFENesin-dextromethorphan (ROBITUSSIN DM) 100-10 MG/5ML syrup Take 5 mLs by mouth every 4 (four) hours as needed for cough.  . [DISCONTINUED] predniSONE (DELTASONE) 5 MG tablet Take one tab po bid   . amoxicillin-clavulanate (AUGMENTIN) 875-125 MG tablet Take 1 tablet by mouth 2 (two) times daily.   No facility-administered encounter medications on file as of 06/22/2020.    Surgical History: History reviewed. No pertinent surgical history.  Medical History: Past Medical History:  Diagnosis Date  . Bipolar 1 disorder (HCC)     Family History: Family History  Problem Relation Age of Onset  . Diabetes Father       Review of Systems  Constitutional: Positive for fatigue. Negative for activity change, chills, fever and unexpected weight change.  HENT: Positive for congestion, postnasal drip, rhinorrhea, sinus pressure, sinus pain and sore throat. Negative for sneezing.   Respiratory: Positive for cough, chest tightness and wheezing. Negative for shortness of breath.   Cardiovascular: Negative for chest pain and palpitations.  Gastrointestinal: Negative for abdominal pain, constipation, diarrhea, nausea and vomiting.  Endocrine: Negative for cold intolerance, heat intolerance, polydipsia and polyuria.  Genitourinary: Negative for dysuria and frequency.  Musculoskeletal: Negative for arthralgias, back pain, joint swelling and neck pain.  Skin: Negative for rash.  Allergic/Immunologic: Positive for environmental allergies.  Neurological: Positive for headaches. Negative for dizziness, tremors and numbness.  Hematological: Negative for adenopathy. Does not bruise/bleed easily.  Psychiatric/Behavioral: Positive for dysphoric mood. Negative for behavioral problems (Depression), sleep disturbance and suicidal ideas. The patient is nervous/anxious.        Patient sees psychiatry on routine basis.     Today's Vitals   06/22/20 1059  BP: 140/81  Pulse: 78  Resp: 16  Temp: 97.6 F (36.4 C)  SpO2: 98%  Weight: 209 lb 9.6 oz (95.1 kg)  Height: 6\' 2"  (1.88 m)   Body mass index is 26.91 kg/m.  Physical Exam Vitals and nursing note reviewed.  Constitutional:      General: He  is not in acute distress.    Appearance: Normal appearance. He is well-developed. He is ill-appearing. He is not diaphoretic.  HENT:     Head: Normocephalic and atraumatic.     Right Ear: Ear canal and external ear normal.     Left Ear: Ear canal and external ear normal.     Nose: Congestion present.     Right Turbinates: Enlarged.     Left Turbinates: Enlarged.     Right Sinus: Maxillary sinus tenderness and frontal sinus tenderness present.     Left Sinus: Maxillary sinus tenderness and frontal sinus tenderness present.     Mouth/Throat:     Pharynx: Posterior oropharyngeal erythema present. No oropharyngeal exudate.  Eyes:     Pupils: Pupils are equal, round, and reactive to light.  Neck:     Thyroid: No thyromegaly.     Vascular: No JVD.     Trachea: No tracheal deviation.  Cardiovascular:     Rate and Rhythm: Normal rate and regular rhythm.     Pulses: Normal pulses.     Heart sounds: Normal heart sounds. No murmur heard.  No friction rub. No gallop.   Pulmonary:     Effort: Pulmonary effort is normal. No respiratory distress.     Breath sounds: Normal breath sounds. No wheezing or rales.     Comments: Dry, nonproductive cough noted.  Chest:     Chest wall: No tenderness.  Abdominal:     General: Bowel sounds are normal.     Palpations: Abdomen is soft.     Tenderness: There is no abdominal tenderness.  Musculoskeletal:        General: Normal range of motion.     Cervical back: Normal range of motion and neck supple.  Lymphadenopathy:     Cervical: Cervical adenopathy present.  Skin:    General: Skin is warm and dry.  Neurological:     General: No focal deficit present.     Mental Status: He is alert and oriented to person, place, and time.     Cranial Nerves: No cranial nerve deficit.  Psychiatric:        Mood and Affect: Mood normal.        Behavior: Behavior normal.        Thought Content: Thought content normal.        Judgment: Judgment normal.       LABS: Recent Results (from the past 2160 hour(s))  SARS CORONAVIRUS 2 (TAT 6-24 HRS) Nasopharyngeal Nasopharyngeal Swab     Status: None   Collection Time: 05/28/20  1:54 PM   Specimen: Nasopharyngeal Swab  Result Value Ref Range   SARS Coronavirus 2 NEGATIVE NEGATIVE    Comment: (NOTE) SARS-CoV-2 target nucleic acids are NOT DETECTED.  The SARS-CoV-2 RNA is generally detectable in upper and lower respiratory specimens during the acute phase of infection. Negative results do not preclude SARS-CoV-2 infection, do not rule out co-infections with other pathogens, and should not be used as the sole basis for treatment or other patient management decisions. Negative results must be combined with clinical observations, patient history, and epidemiological information. The expected result is Negative.  Fact Sheet for Patients: 05/30/20  Fact Sheet for Healthcare Providers: HairSlick.no  This test is not yet approved  or cleared by the [place] and  has been authorized for detection and/or diagnosis of SARS-CoV-2 by FDA under an Emergency Use Authorization (EUA). This EUA will remain  in effect (meaning this test can be used) for the duration of the COVID-19 declaration under Se ction 564(b)(1) of the Act, 21 U.S.C. section 360bbb-3(b)(1), unless the authorization is terminated or revoked sooner.  Performed at [facility], [address] [place], [place] [address]   UA/M w/rflx Culture, Routine     Status: None   Collection Time: 06/22/20 12:17 PM   Specimen: Urine   Urine  Result Value Ref Range   Specific Gravity, UA 1.027 1.005 - 1.030   pH, UA 7.5 5.0 - 7.5   Color, UA Yellow Yellow   Appearance Ur Clear Clear   Leukocytes,UA Negative Negative   Protein,UA Negative Negative/Trace   Glucose, UA Negative Negative   Ketones, UA Negative Negative   RBC, UA Negative Negative   Bilirubin,  UA Negative Negative   Urobilinogen, Ur 0.2 0.2 - 1.0 mg/dL   Nitrite, UA Negative Negative   Microscopic Examination Comment     Comment: Microscopic follows if indicated.   Microscopic Examination See below:     Comment: Microscopic was indicated and was performed.   Urinalysis Reflex Comment     Comment: This specimen will not reflex to a Urine Culture.  Microscopic Examination     Status: None   Collection Time: 06/22/20 12:17 PM   Urine  Result Value Ref Range   WBC, UA None seen 0 - 5 /hpf   RBC 0-2 0 - 2 /hpf   Epithelial Cells (non renal) None seen 0 - 10 /hpf   Casts None seen None seen /lpf   Bacteria, UA None seen None seen/Few    Assessment/Plan: 1. Encounter for general adult medical examination with abnormal findings Annual health maintenance exam today. Order slip given to have routine, fasting labs done   2. Acute upper respiratory infection Treat with augmentin 875mg  twice daily for 10 days. Rest and increase fluids. Continue to take previously prescribed medications to treat acute symptoms  - amoxicillin-clavulanate (AUGMENTIN) 875-125 MG tablet; Take 1 tablet by mouth 2 (two) times daily.  Dispense: 20 tablet; Refill: 0  3. Bipolar disorder, current episode manic severe with psychotic features (HCC) Continue regular visits with psychiatry as scheduled.   4. Dysuria - UA/M w/rflx Culture, Routine  General Counseling: understanding of the findings of todays visit and agrees with plan of treatment. I have discussed any further diagnostic evaluation that may be needed or ordered today. We also reviewed his medications today. he has been encouraged to call the office with any questions or concerns that should arise related to todays visit.    Counseling:  This patient was seen by Lorenda Cahill FNP Collaboration with Dr Vincent Gros as a part of collaborative care agreement  Orders Placed This Encounter  Procedures  . Microscopic  Examination  . UA/M w/rflx Culture, Routine    Meds ordered this encounter  Medications  . amoxicillin-clavulanate (AUGMENTIN) 875-125 MG tablet    Sig: Take 1 tablet by mouth 2 (two) times daily.    Dispense:  20 tablet    Refill:  0    Order Specific Question:   Supervising Provider    Answer:   Lyndon Code [[id number]]    Total time spent: 30 Minutes  Time spent includes review of chart, medications, test results, and  follow up plan with the patient.     Lavera Guise, MD  Internal Medicine

## 2020-06-23 LAB — MICROSCOPIC EXAMINATION
Bacteria, UA: NONE SEEN
Casts: NONE SEEN /lpf
Epithelial Cells (non renal): NONE SEEN /hpf (ref 0–10)
WBC, UA: NONE SEEN /hpf (ref 0–5)

## 2020-06-23 LAB — UA/M W/RFLX CULTURE, ROUTINE
Bilirubin, UA: NEGATIVE
Glucose, UA: NEGATIVE
Ketones, UA: NEGATIVE
Leukocytes,UA: NEGATIVE
Nitrite, UA: NEGATIVE
Protein,UA: NEGATIVE
RBC, UA: NEGATIVE
Specific Gravity, UA: 1.027 (ref 1.005–1.030)
Urobilinogen, Ur: 0.2 mg/dL (ref 0.2–1.0)
pH, UA: 7.5 (ref 5.0–7.5)

## 2020-07-05 DIAGNOSIS — R300 Dysuria: Secondary | ICD-10-CM | POA: Insufficient documentation

## 2020-07-05 DIAGNOSIS — Z0001 Encounter for general adult medical examination with abnormal findings: Secondary | ICD-10-CM | POA: Insufficient documentation

## 2020-07-05 DIAGNOSIS — J069 Acute upper respiratory infection, unspecified: Secondary | ICD-10-CM | POA: Insufficient documentation

## 2020-07-07 ENCOUNTER — Other Ambulatory Visit: Payer: Self-pay | Admitting: Nurse Practitioner

## 2020-07-11 LAB — LIPID PANEL WITH LDL/HDL RATIO
Cholesterol, Total: 160 mg/dL (ref 100–199)
HDL: 27 mg/dL — ABNORMAL LOW (ref >39)
LDL Chol Calc (NIH): 119 mg/dL — ABNORMAL HIGH (ref 0–99)
LDL/HDL Ratio: 4.4 ratio — ABNORMAL HIGH (ref 0.0–3.6)
Triglycerides: 74 mg/dL (ref 0–149)
VLDL Cholesterol Cal: 14 mg/dL (ref 5–40)

## 2020-07-11 LAB — COMPREHENSIVE METABOLIC PANEL
ALT: 22 IU/L (ref 0–44)
AST: 19 IU/L (ref 0–40)
Albumin/Globulin Ratio: 2.2 (ref 1.2–2.2)
Albumin: 5 g/dL (ref 4.1–5.2)
Alkaline Phosphatase: 89 IU/L (ref 48–121)
BUN/Creatinine Ratio: 14 (ref 9–20)
BUN: 13 mg/dL (ref 6–20)
Bilirubin Total: 0.4 mg/dL (ref 0.0–1.2)
CO2: 24 mmol/L (ref 20–29)
Calcium: 9.5 mg/dL (ref 8.7–10.2)
Chloride: 102 mmol/L (ref 96–106)
Creatinine, Ser: 0.95 mg/dL (ref 0.76–1.27)
GFR calc Af Amer: 129 mL/min/{1.73_m2} (ref >59)
GFR calc non Af Amer: 112 mL/min/{1.73_m2} (ref >59)
Globulin, Total: 2.3 g/dL (ref 1.5–4.5)
Glucose: 98 mg/dL (ref 65–99)
Potassium: 4.5 mmol/L (ref 3.5–5.2)
Sodium: 141 mmol/L (ref 134–144)
Total Protein: 7.3 g/dL (ref 6.0–8.5)

## 2020-07-11 LAB — CBC
Hematocrit: 44.3 % (ref 37.5–51.0)
Hemoglobin: 15.4 g/dL (ref 13.0–17.7)
MCH: 30.3 pg (ref 26.6–33.0)
MCHC: 34.8 g/dL (ref 31.5–35.7)
MCV: 87 fL (ref 79–97)
Platelets: 242 10*3/uL (ref 150–450)
RBC: 5.08 x10E6/uL (ref 4.14–5.80)
RDW: 12.6 % (ref 11.6–15.4)
WBC: 4.1 10*3/uL (ref 3.4–10.8)

## 2020-07-11 LAB — TSH: TSH: 1.52 u[IU]/mL (ref 0.450–4.500)

## 2020-07-11 LAB — T4, FREE: Free T4: 1.37 ng/dL (ref 0.82–1.77)

## 2020-07-13 ENCOUNTER — Telehealth: Payer: Self-pay

## 2020-07-13 NOTE — Progress Notes (Signed)
holesterol. This was moderately elevated, increasing his risk for developing cardiovascular disease as he gets older. This is likely due to his invega, which he needs to stay on. I would like to start him on a low dose crestor every day to help lower this and lower his risk

## 2020-07-13 NOTE — Telephone Encounter (Signed)
Please let the patient know that his labs were good except for his cholesterol. This was moderately elevated, increasing his risk for developing cardiovascular disease as he gets older. This is likely due to his invega, which he needs to stay on. I would like to start him on a low dose crestor every day to help lower this and lower his risk. If he is ok with this I can send prescription to his pharmacy. I just need to know which one. Thanks

## 2020-07-14 NOTE — Telephone Encounter (Signed)
Spoke with pt about result he said he will discuss with his mom and call us back

## 2020-07-17 ENCOUNTER — Telehealth: Payer: Self-pay

## 2020-07-17 ENCOUNTER — Other Ambulatory Visit: Payer: Self-pay | Admitting: Nurse Practitioner

## 2020-07-17 ENCOUNTER — Other Ambulatory Visit: Payer: Self-pay

## 2020-07-17 DIAGNOSIS — R059 Cough, unspecified: Secondary | ICD-10-CM

## 2020-07-17 MED ORDER — METHYLPREDNISOLONE 4 MG PO TBPK: ORAL_TABLET | ORAL | 0 refills | Status: DC

## 2020-07-17 NOTE — Telephone Encounter (Signed)
Pt like to wait for chol med for now with diet and exercise and also send message to heather about this

## 2020-07-17 NOTE — Telephone Encounter (Signed)
Pt.notified

## 2020-07-17 NOTE — Telephone Encounter (Signed)
I have sent prescription for medrol dose pack. He should take as directed for 6 days. This should help reduce lung inflammation and cough. I also ordered a new chest x-ray. Has he been exposed to anyone with whooping cough? Has he been vaccinated? Like his last TDAP?

## 2020-09-04 ENCOUNTER — Other Ambulatory Visit: Payer: Self-pay

## 2020-09-04 MED ORDER — FLOVENT HFA 220 MCG/ACT IN AERO: 2.0000 | INHALATION_SPRAY | Freq: Two times a day (BID) | RESPIRATORY_TRACT | 3 refills | Status: DC

## 2020-09-04 NOTE — Telephone Encounter (Signed)
Spoke to pt and asked about his symptoms per DFK.  Pt informed me that he has a cough that has been going on for 2 months and is worse at night,  and he has taken all kinds of medications like delsym, tussinex and nothing seems to help.  He has also had sneezing but he says comes from his allergies.  I informed DFK about his symptoms and she advised for me to send in flovent inhaler 220 MCG 2 puffs twice a day.  Per DFK I informed pt to also get Prilosec OTC to use daily and to take his allergy medication daily.  Pt told to continue all meds I listed everyday until he comes for his appt with [place].

## 2020-09-07 ENCOUNTER — Ambulatory Visit: Payer: Managed Care, Other (non HMO) | Admitting: Internal Medicine

## 2020-09-19 ENCOUNTER — Other Ambulatory Visit: Payer: Self-pay

## 2020-09-19 ENCOUNTER — Encounter: Payer: Self-pay | Admitting: Internal Medicine

## 2020-09-19 ENCOUNTER — Ambulatory Visit: Payer: Managed Care, Other (non HMO) | Admitting: Internal Medicine

## 2020-09-19 DIAGNOSIS — J45909 Unspecified asthma, uncomplicated: Secondary | ICD-10-CM

## 2020-09-19 DIAGNOSIS — R0602 Shortness of breath: Secondary | ICD-10-CM | POA: Diagnosis not present

## 2020-09-19 DIAGNOSIS — J300 Vasomotor rhinitis: Secondary | ICD-10-CM

## 2020-09-19 DIAGNOSIS — K219 Gastro-esophageal reflux disease without esophagitis: Secondary | ICD-10-CM

## 2020-09-19 MED ORDER — AZITHROMYCIN 250 MG PO TABS: ORAL_TABLET | ORAL | 0 refills | Status: DC

## 2020-09-19 MED ORDER — MONTELUKAST SODIUM 10 MG PO TABS: 10.0000 mg | ORAL_TABLET | Freq: Every day | ORAL | 3 refills | Status: DC

## 2020-09-19 MED ORDER — FAMOTIDINE 20 MG PO TABS: ORAL_TABLET | ORAL | 2 refills | Status: DC

## 2020-09-19 NOTE — Progress Notes (Signed)
[facility] [address] [place], [place] [address]  Internal MEDICINE  Office Visit Note  Patient Name: GUERINO CAPORALE  [id number]  [id number]  Date of Service: 09/20/2020  Chief Complaint  Patient presents with  . Acute Visit    sneeze in the morning, runny nose, allergy meds not helping much  . Cough    x3 months, worse at night and first thing in the AM, coughing so hard pt throws up x1 month  . policy update form    received    HPI  Pt is here with acute and sick visit. Pt has been having cough and congestion, has postnasal drip. His cough is worse at night. C/o heart burn as well. No fever or chills. Recent CXR is negative for any acute pathology. Has not had CT chest done. Pt has bipolar, schizoaffective disorder as well. Unable to rest at night. Has never been tested for allergies    Current Medication: Outpatient Encounter Medications as of 09/19/2020  Medication Sig  . azithromycin (ZITHROMAX) 250 MG tablet Take one tab a day for 10 days for uri  . famotidine (PEPCID) 20 MG tablet TAKE ONE TAB PO BID FOR GERD  . ipratropium (ATROVENT) 0.06 % nasal spray Place 2 sprays into both nostrils 4 (four) times daily as needed for rhinitis.  Marland Kitchen montelukast (SINGULAIR) 10 MG tablet Take 1 tablet (10 mg total) by mouth at bedtime.  . paliperidone (INVEGA SUSTENNA) 234 MG/1.5ML SUSP injection Inject 234 mg into the muscle every 28 (twenty-eight) days.  . [DISCONTINUED] amoxicillin-clavulanate (AUGMENTIN) 875-125 MG tablet Take 1 tablet by mouth 2 (two) times daily.  . [DISCONTINUED] benzonatate (TESSALON PERLES) 100 MG capsule Take 1 capsule (100 mg total) by mouth 3 (three) times daily as needed for cough.  . [DISCONTINUED] fluticasone (FLOVENT HFA) 220 MCG/ACT inhaler Inhale 2 puffs into the lungs 2 (two) times daily. Rinse mouth after each use  . [DISCONTINUED] methylPREDNISolone (MEDROL) 4 MG TBPK tablet Take by mouth as directed for 6 days   No  facility-administered encounter medications on file as of 09/19/2020.    Surgical History: History reviewed. No pertinent surgical history.  Medical History: Past Medical History:  Diagnosis Date  . Bipolar 1 disorder (HCC)     Family History: Family History  Problem Relation Age of Onset  . Diabetes Father     Social History   Socioeconomic History  . Marital status: Single    Spouse name: Not on file  . Number of children: Not on file  . Years of education: Not on file  . Highest education level: Not on file  Occupational History  . Not on file  Tobacco Use  . Smoking status: Never Smoker  . Smokeless tobacco: Never Used  Vaping Use  . Vaping Use: Never used  Substance and Sexual Activity  . Alcohol use: Yes    Comment: sometimes  . Drug use: Not Currently  . Sexual activity: Not on file  Other Topics Concern  . Not on file  Social History Narrative  . Not on file   Social Determinants of Health   Financial Resource Strain:   . Difficulty of Paying Living Expenses: Not on file  Food Insecurity:   . Worried About Programme researcher, broadcasting/film/video in the Last Year: Not on file  . Ran Out of Food in the Last Year: Not on file  Transportation Needs:   . Lack of Transportation (Medical): Not on file  . Lack of Transportation (  Non-Medical): Not on file  Physical Activity:   . Days of Exercise per Week: Not on file  . Minutes of Exercise per Session: Not on file  Stress:   . Feeling of Stress : Not on file  Social Connections:   . Frequency of Communication with Friends and Family: Not on file  . Frequency of Social Gatherings with Friends and Family: Not on file  . Attends Religious Services: Not on file  . Active Member of Clubs or Organizations: Not on file  . Attends Banker Meetings: Not on file  . Marital Status: Not on file  Intimate Partner Violence:   . Fear of Current or Ex-Partner: Not on file  . Emotionally Abused: Not on file  . Physically  Abused: Not on file  . Sexually Abused: Not on file      Review of Systems  Constitutional: Negative for chills, fatigue and unexpected weight change.  HENT: Positive for postnasal drip and rhinorrhea. Negative for congestion, sneezing and sore throat.   Eyes: Negative for redness.  Respiratory: Positive for cough. Negative for chest tightness and shortness of breath.   Cardiovascular: Negative for chest pain and palpitations.  Gastrointestinal: Negative for abdominal pain, constipation, diarrhea, nausea and vomiting.  Genitourinary: Negative for dysuria and frequency.  Musculoskeletal: Negative for arthralgias, back pain, joint swelling and neck pain.  Skin: Negative for rash.  Neurological: Negative.  Negative for tremors and numbness.  Hematological: Negative for adenopathy. Does not bruise/bleed easily.  Psychiatric/Behavioral: Negative for behavioral problems (Depression), sleep disturbance and suicidal ideas. The patient is not nervous/anxious.     Vital Signs: BP 126/86   Pulse 74   Temp 97.9 F (36.6 C)   Resp 16   Ht 6\' 3"  (1.905 m)   Wt 208 lb 6.4 oz (94.5 kg)   SpO2 98%   BMI 26.05 kg/m    Physical Exam Constitutional:      General: He is not in acute distress.    Appearance: He is well-developed. He is not diaphoretic.  HENT:     Head: Normocephalic and atraumatic.     Nose: Nasal tenderness and mucosal edema present.     Right Turbinates: Enlarged.     Left Turbinates: Enlarged.     Right Sinus: Maxillary sinus tenderness present.     Left Sinus: Maxillary sinus tenderness present.     Mouth/Throat:     Pharynx: Posterior oropharyngeal erythema present. No oropharyngeal exudate.  Eyes:     Extraocular Movements: Extraocular movements intact.     Pupils: Pupils are equal, round, and reactive to light.  Neck:     Thyroid: No thyromegaly.     Vascular: No JVD.     Trachea: No tracheal deviation.  Cardiovascular:     Rate and Rhythm: Normal rate and  regular rhythm.     Heart sounds: Normal heart sounds. No murmur heard.  No friction rub. No gallop.   Pulmonary:     Effort: Pulmonary effort is normal. No respiratory distress.     Breath sounds: No wheezing or rales.  Chest:     Chest wall: No tenderness.  Abdominal:     General: Bowel sounds are normal.     Palpations: Abdomen is soft.  Musculoskeletal:        General: Normal range of motion.     Cervical back: Normal range of motion and neck supple.  Lymphadenopathy:     Cervical: No cervical adenopathy.  Skin:    General:  Skin is warm and dry.  Neurological:     Mental Status: He is alert and oriented to person, place, and time.     Cranial Nerves: No cranial nerve deficit.  Psychiatric:        Behavior: Behavior normal.        Thought Content: Thought content normal.        Judgment: Judgment normal.     Assessment/Plan: 1. Acute asthmatic bronchitis Samples of Dulera is given 2 puffs bid  - Spirometry with Graph - CT MAXILLOFACIAL WO CONTRAST; Future - azithromycin (ZITHROMAX) 250 MG tablet; Take one tab a day for 10 days for uri  Dispense: 10 tablet; Refill: 0  2. Vasomotor rhinitis Continue atrovent nasal spray  - Allergy Test - montelukast (SINGULAIR) 10 MG tablet; Take 1 tablet (10 mg total) by mouth at bedtime.  Dispense: 30 tablet; Refill: 3  3. GERD without esophagitis Cough worse at night with reflux symptoms, will add Pepcid  - famotidine (PEPCID) 20 MG tablet; TAKE ONE TAB PO BID FOR GERD  Dispense: 60 tablet; Refill: 2  General Counseling: Lorenda Cahill understanding of the findings of todays visit and agrees with plan of treatment. I have discussed any further diagnostic evaluation that may be needed or ordered today. We also reviewed his medications today. he has been encouraged to call the office with any questions or concerns that should arise related to todays visit.    Orders Placed This Encounter  Procedures  . Allergy Test  . CT  MAXILLOFACIAL WO CONTRAST  . Spirometry with Graph    Meds ordered this encounter  Medications  . azithromycin (ZITHROMAX) 250 MG tablet    Sig: Take one tab a day for 10 days for uri    Dispense:  10 tablet    Refill:  0  . montelukast (SINGULAIR) 10 MG tablet    Sig: Take 1 tablet (10 mg total) by mouth at bedtime.    Dispense:  30 tablet    Refill:  3  . famotidine (PEPCID) 20 MG tablet    Sig: TAKE ONE TAB PO BID FOR GERD    Dispense:  60 tablet    Refill:  2    Total time spent: 30 Minutes Time spent includes review of chart, medications, test results, and follow up plan with the patient.      Dr Lyndon Code Internal medicine

## 2020-10-03 ENCOUNTER — Telehealth: Payer: Self-pay

## 2020-10-03 NOTE — Telephone Encounter (Signed)
Patient advised of allergy test benefits and will call back if symptoms worsen but at moment he doesn't feel the need to have allergy test done/. Leonard Davidson

## 2020-10-10 ENCOUNTER — Ambulatory Visit: Payer: Managed Care, Other (non HMO) | Admitting: Internal Medicine

## 2020-10-16 ENCOUNTER — Ambulatory Visit
Admission: RE | Admit: 2020-10-16 | Discharge: 2020-10-16 | Disposition: A | Discharge status: Home / Self Care | Payer: Managed Care, Other (non HMO) | Source: Ambulatory Visit | Attending: Internal Medicine | Admitting: Internal Medicine

## 2020-10-16 ENCOUNTER — Other Ambulatory Visit: Payer: Self-pay

## 2020-10-16 DIAGNOSIS — J45909 Unspecified asthma, uncomplicated: Secondary | ICD-10-CM | POA: Diagnosis not present

## 2020-10-17 ENCOUNTER — Ambulatory Visit: Payer: Managed Care, Other (non HMO) | Admitting: Internal Medicine

## 2020-10-17 ENCOUNTER — Encounter: Payer: Self-pay | Admitting: Internal Medicine

## 2020-10-17 VITALS — BP 132/76 | HR 82 | Temp 98.2°F | Resp 16 | Ht 75.0 in | Wt 209.0 lb

## 2020-10-17 DIAGNOSIS — R059 Cough, unspecified: Secondary | ICD-10-CM

## 2020-10-17 DIAGNOSIS — J320 Chronic maxillary sinusitis: Secondary | ICD-10-CM | POA: Diagnosis not present

## 2020-10-17 DIAGNOSIS — J309 Allergic rhinitis, unspecified: Secondary | ICD-10-CM

## 2020-10-17 DIAGNOSIS — H1010 Acute atopic conjunctivitis, unspecified eye: Secondary | ICD-10-CM

## 2020-10-17 MED ORDER — SULFAMETHOXAZOLE-TRIMETHOPRIM 400-80 MG PO TABS: 1.0000 | ORAL_TABLET | Freq: Two times a day (BID) | ORAL | 0 refills | Status: DC

## 2020-10-17 NOTE — Progress Notes (Signed)
[facility] [address] [place], [place] [address]  Internal MEDICINE  Office Visit Note  Patient Name: Leonard Davidson  [id number]  [id number]  Date of Service: 10/25/2020  Chief Complaint  Patient presents with  . Follow-up    3 week fup and to review CT scan    HPI Pt is here for follow up. Mother in the room. Cough is improved after Azithromycin for 10 days, he continues to have nasal congestion. CT scan of sinuses shows the following  1. Severe paranasal sinus disease with occlusion of the sinus  drainage pathways.  2. Mild leftward septal deviation with leftward projecting osseous  spur.  3. Above anatomic details may be helpful for surgical planning.  He was unable to schedule allergy testing, but is willing to get them now   Current Medication: Outpatient Encounter Medications as of 10/17/2020  Medication Sig  . ipratropium (ATROVENT) 0.06 % nasal spray Place 2 sprays into both nostrils 4 (four) times daily as needed for rhinitis.  Marland Kitchen montelukast (SINGULAIR) 10 MG tablet Take 1 tablet (10 mg total) by mouth at bedtime.  . paliperidone (INVEGA SUSTENNA) 234 MG/1.5ML SUSP injection Inject 234 mg into the muscle every 28 (twenty-eight) days.  Marland Kitchen sulfamethoxazole-trimethoprim (BACTRIM) 400-80 MG tablet Take 1 tablet by mouth 2 (two) times daily.  . [DISCONTINUED] azithromycin (ZITHROMAX) 250 MG tablet Take one tab a day for 10 days for uri (Patient not taking: Reported on 10/17/2020)  . [DISCONTINUED] famotidine (PEPCID) 20 MG tablet TAKE ONE TAB PO BID FOR GERD (Patient not taking: Reported on 10/17/2020)   No facility-administered encounter medications on file as of 10/17/2020.    Surgical History: Past Surgical History:  Procedure Laterality Date  . NO PAST SURGERIES      Medical History: Past Medical History:  Diagnosis Date  . Bipolar 1 disorder (HCC)     Family History: Family History  Problem Relation Age of Onset  . Diabetes Father      Social History   Socioeconomic History  . Marital status: Single    Spouse name: Not on file  . Number of children: Not on file  . Years of education: Not on file  . Highest education level: Not on file  Occupational History  . Not on file  Tobacco Use  . Smoking status: Never Smoker  . Smokeless tobacco: Never Used  Vaping Use  . Vaping Use: Never used  Substance and Sexual Activity  . Alcohol use: Yes    Comment: sometimes  . Drug use: Not Currently  . Sexual activity: Not on file  Other Topics Concern  . Not on file  Social History Narrative  . Not on file   Social Determinants of Health   Financial Resource Strain: Not on file  Food Insecurity: Not on file  Transportation Needs: Not on file  Physical Activity: Not on file  Stress: Not on file  Social Connections: Not on file  Intimate Partner Violence: Not on file      Review of Systems  Constitutional: Negative for chills, fatigue and unexpected weight change.  HENT: Positive for postnasal drip. Negative for congestion, rhinorrhea, sneezing and sore throat.   Eyes: Negative for redness.  Respiratory: Negative for cough, chest tightness and shortness of breath.   Cardiovascular: Negative for chest pain and palpitations.  Gastrointestinal: Negative for abdominal pain, constipation, diarrhea, nausea and vomiting.  Genitourinary: Negative for dysuria and frequency.  Musculoskeletal: Negative for arthralgias, back pain, joint swelling and neck  pain.  Skin: Negative for rash.  Neurological: Negative.  Negative for tremors and numbness.  Hematological: Negative for adenopathy. Does not bruise/bleed easily.  Psychiatric/Behavioral: Negative for behavioral problems (Depression), sleep disturbance and suicidal ideas. The patient is not nervous/anxious.     Vital Signs: BP 132/76   Pulse 82   Temp 98.2 F (36.8 C)   Resp 16   Ht 6\' 3"  (1.905 m)   Wt 209 lb (94.8 kg)   SpO2 98%   BMI 26.12 kg/m     Physical Exam Constitutional:      General: He is not in acute distress.    Appearance: He is well-developed and well-nourished. He is not diaphoretic.  HENT:     Head: Normocephalic and atraumatic.     Nose: Congestion present.     Mouth/Throat:     Mouth: Oropharynx is clear and moist.     Pharynx: No oropharyngeal exudate.  Eyes:     Extraocular Movements: Extraocular movements intact and EOM normal.     Pupils: Pupils are equal, round, and reactive to light.  Neck:     Thyroid: No thyromegaly.     Vascular: No JVD.     Trachea: No tracheal deviation.  Cardiovascular:     Rate and Rhythm: Normal rate and regular rhythm.     Heart sounds: Normal heart sounds. No murmur heard. No friction rub. No gallop.   Pulmonary:     Effort: Pulmonary effort is normal. No respiratory distress.     Breath sounds: No wheezing or rales.  Chest:     Chest wall: No tenderness.  Abdominal:     General: Bowel sounds are normal.     Palpations: Abdomen is soft.  Musculoskeletal:        General: Normal range of motion.     Cervical back: Normal range of motion and neck supple.  Lymphadenopathy:     Cervical: No cervical adenopathy.  Skin:    General: Skin is warm and dry.  Neurological:     Mental Status: He is alert and oriented to person, place, and time.     Cranial Nerves: No cranial nerve deficit.  Psychiatric:        Mood and Affect: Mood and affect normal.        Behavior: Behavior normal.        Thought Content: Thought content normal.        Judgment: Judgment normal.        Assessment/Plan: 1. Allergic rhinoconjunctivitis Pt is willing to schedule allergy testing at this point - Allergy Test  2. Chronic sinusitis of both maxillary sinuses CT scan sinuses is discussed with pt and his mother, will start on abx before consulting ENT  - sulfamethoxazole-trimethoprim (BACTRIM) 400-80 MG tablet; Take 1 tablet by mouth 2 (two) times daily.  Dispense: 30 tablet; Refill:  0  3. Cough Will see pulmonary and might need PFT   General Counseling: understanding of the findings of todays visit and agrees with plan of treatment. I have discussed any further diagnostic evaluation that may be needed or ordered today. We also reviewed his medications today. he has been encouraged to call the office with any questions or concerns that should arise related to todays visit.    Orders Placed This Encounter  Procedures  . Allergy Test    Meds ordered this encounter  Medications  . sulfamethoxazole-trimethoprim (BACTRIM) 400-80 MG tablet    Sig: Take 1 tablet by mouth 2 (two)  times daily.    Dispense:  30 tablet    Refill:  0    Total time spent: 30 Minutes Time spent includes review of chart, medications, test results, and follow up plan with the patient.      Dr Lyndon Code Internal medicine

## 2020-10-19 ENCOUNTER — Other Ambulatory Visit: Payer: Self-pay

## 2020-10-19 ENCOUNTER — Encounter: Payer: Self-pay | Admitting: Internal Medicine

## 2020-10-19 ENCOUNTER — Ambulatory Visit: Payer: Managed Care, Other (non HMO) | Admitting: Internal Medicine

## 2020-10-19 VITALS — BP 122/72 | HR 96 | Temp 97.9°F | Resp 16 | Ht 75.0 in | Wt 206.4 lb

## 2020-10-19 DIAGNOSIS — J301 Allergic rhinitis due to pollen: Secondary | ICD-10-CM | POA: Diagnosis not present

## 2020-10-19 NOTE — Procedures (Signed)
    [facility] Allergy MQT Recording Form  [facility] [address] [place], [place] [address] Phone [phone] Fax [phone]   Patient Name: Leonard Davidson Age: 24 y.o. Sex: male Date of Service: 10/19/2020  Performing Provider: Yevonne Pax MD [facility]         Battery A Back   Site Antigen [facility] Flare  A1 Positive Control 7 12  A2 Negative Control 3 3  A3 American Elm 0 0  A4 Maple Box Elder 0 0  A5 Grass Mix 4 5  A6 Dock Sorrel Mix 7 7  A7 Russian Thistle 7 10  A8 Ragweed Mix 0 0  A9 English Pantain 0 0  A10 Oak Mix  4 7   Battery B Wheal Flare  B1 Lambs Quarters 7 9  B2 Cottonwood 7 7  B3 Pigweed Mix 0 0  B4 Acacia 7 7  B5 Pine Mix 0 0  B6 Privet 0 0  B7 White/Red Mulberry 0 0  B8 Western Water Hemp 0 0  B9 French [place] Grass 7 11  B10 Melalucea 0 0   Battery C Wheal Flare  C1 Red River Birch 9 10  C2 Eastern Sycamore 12 12  C3 Bahai Grass 7 9  C4 American Beech 7 7  C5 Ash Mix 0 0  C6 Black Willow 0 0  C7 Hickory 0 0  C8 Black Walnut 0 0  C9 Red Cedar 0 0  C10 Sweet Gum  7 9   Battery D Wheal Flare  D1 Cultivated Oat 0 0  D2 Dog Fennel 0 0  D3 Common Mugwort 0 0  D4 Marsh Elder 0 0  D5 Johnson 0 0  D6 Hackberry Tree 0 0  D7 Bayberry Tree 0 0  D8 Cypress, Bald Tree 0 0  D9 Aspergillus Fumigatus 0 0  D10 Alternia  0 0   Battery E Wheal Flare  E1 Dreschlere 0 0  E2 Fusarium Mix 7 7  E3 Cladosporum Sph 0 0  E4 Bipolaris 0 0  E5 Penicillin chrys 0 0  E6 Cladosporum Herb 7 9  E7 Candida 0 0  E8 Aureobasidium 0 0  E9 Rhizopus 0 0  E10 Botrytis  0 0   Battery F Wheal Flare  F1 Aspergillus [place] 0 0  F2 Dust Mite Mix 9 22  F3 Cockroach Mix 7 14  F4 Cat Hair 0 0  F5 Dog Mixed breeds 0 0  F6 Feather Mix 0 0

## 2020-10-22 NOTE — Progress Notes (Signed)
Allergy test performed 

## 2020-11-21 ENCOUNTER — Ambulatory Visit: Payer: Managed Care, Other (non HMO) | Admitting: Internal Medicine

## 2020-11-24 ENCOUNTER — Ambulatory Visit: Payer: Managed Care, Other (non HMO) | Admitting: Internal Medicine

## 2021-02-23 ENCOUNTER — Telehealth: Payer: Self-pay

## 2021-02-23 NOTE — Telephone Encounter (Signed)
Completed medical records for [facility]  Faxed records and payment request to [address]  [address] [place], [place] [address] amount due $20.00 faxed to [phone]

## 2021-05-04 ENCOUNTER — Ambulatory Visit
Admission: RE | Admit: 2021-05-04 | Discharge: 2021-05-04 | Disposition: A | Discharge status: Home / Self Care | Payer: Managed Care, Other (non HMO) | Source: Ambulatory Visit | Attending: Family Medicine | Admitting: Family Medicine

## 2021-05-04 ENCOUNTER — Other Ambulatory Visit: Payer: Self-pay

## 2021-05-04 VITALS — BP 113/73 | HR 68 | Temp 98.6°F | Resp 18

## 2021-05-04 DIAGNOSIS — R053 Chronic cough: Secondary | ICD-10-CM

## 2021-05-04 DIAGNOSIS — J329 Chronic sinusitis, unspecified: Secondary | ICD-10-CM | POA: Diagnosis not present

## 2021-05-04 HISTORY — DX: Other seasonal allergic rhinitis: J30.2

## 2021-05-04 MED ORDER — PROMETHAZINE-DM 6.25-15 MG/5ML PO SYRP: 5.0000 mL | ORAL_SOLUTION | Freq: Four times a day (QID) | ORAL | 0 refills | Status: DC | PRN

## 2021-05-04 MED ORDER — MONTELUKAST SODIUM 10 MG PO TABS: 10.0000 mg | ORAL_TABLET | Freq: Every day | ORAL | 1 refills | Status: DC

## 2021-05-04 MED ORDER — ALBUTEROL SULFATE HFA 108 (90 BASE) MCG/ACT IN AERS: 1.0000 | INHALATION_SPRAY | Freq: Four times a day (QID) | RESPIRATORY_TRACT | 0 refills | Status: DC | PRN

## 2021-05-04 MED ORDER — AMOXICILLIN-POT CLAVULANATE 875-125 MG PO TABS: 1.0000 | ORAL_TABLET | Freq: Two times a day (BID) | ORAL | 0 refills | Status: DC

## 2021-05-04 NOTE — Discharge Instructions (Addendum)
Medications as prescribed.  Follow up with your PCP for referral to pulmonology & ENT.  Take care  Dr. Adriana Simas

## 2021-05-04 NOTE — ED Provider Notes (Signed)
[facility]    CSN: [id number] Arrival date & time: 05/04/21  1137      History   Chief Complaint Chief Complaint  Patient presents with   Cough    HPI 25 year old male presents with ongoing cough.  Patient reports 46-month history of nasal congestion and cough.  Dry cough.  No fever.  He states that he has taken some over-the-counter medication without resolution.  Patient has had previous issues with similar symptoms last year.  He has chronic sinusitis as documented by CT maxillofacial.  He has not seen ENT or pulmonology.  Symptoms worse in the morning and at night.   Past Medical History:  Diagnosis Date   Bipolar 1 disorder (HCC)    Seasonal allergies     Patient Active Problem List   Diagnosis Date Noted   Encounter for general adult medical examination with abnormal findings 07/05/2020   Acute upper respiratory infection 07/05/2020   Dysuria 07/05/2020   Noncompliance 09/02/2016   Cannabis use disorder, severe, dependence (HCC) 08/16/2016   Bipolar disorder, current episode manic severe with psychotic features (HCC) 08/16/2016    Past Surgical History:  Procedure Laterality Date   NO PAST SURGERIES      Home Medications    Prior to Admission medications   Medication Sig Start Date End Date Taking? Authorizing Provider  albuterol (VENTOLIN HFA) 108 (90 Base) MCG/ACT inhaler Inhale 1-2 puffs into the lungs every 6 (six) hours as needed for wheezing or shortness of breath. 05/04/21  Yes Aalyssa Elderkin G, DO  amoxicillin-clavulanate (AUGMENTIN) 875-125 MG tablet Take 1 tablet by mouth 2 (two) times daily. 05/04/21  Yes Raydan Schlabach G, DO  cetirizine (ZYRTEC) 10 MG tablet Take 10 mg by mouth daily.   Yes [provider]  montelukast (SINGULAIR) 10 MG tablet Take 1 tablet (10 mg total) by mouth at bedtime. 05/04/21  Yes Zlatan Hornback G, DO  promethazine-dextromethorphan (PROMETHAZINE-DM) 6.25-15 MG/5ML syrup Take 5 mLs by mouth 4 (four) times daily as  needed for cough. 05/04/21  Yes Tommie Sams, DO    Family History Family History  Problem Relation Age of Onset   Diabetes Father     Social History Social History   Tobacco Use   Smoking status: Never   Smokeless tobacco: Never  Vaping Use   Vaping Use: Never used  Substance Use Topics   Alcohol use: Yes    Comment: sometimes   Drug use: Not Currently     Allergies   Patient has no known allergies.   Review of Systems Review of Systems  HENT:  Positive for congestion.   Respiratory:  Positive for cough.     Physical Exam Triage Vital Signs ED Triage Vitals  Enc Vitals Group     BP 05/04/21 1156 113/73     Pulse Rate 05/04/21 1156 68     Resp 05/04/21 1156 18     Temp 05/04/21 1156 98.6 F (37 C)     Temp Source 05/04/21 1156 Oral     SpO2 05/04/21 1156 98 %     Weight --      Height --      Head Circumference --      Peak Flow --      Pain Score 05/04/21 1154 0     Pain Loc --      Pain Edu? --      Excl. in GC? --    Updated Vital Signs BP 113/73 (BP  Location: Left Arm)   Pulse 68   Temp 98.6 F (37 C) (Oral)   Resp 18   SpO2 98%   Visual Acuity Right Eye Distance:   Left Eye Distance:   Bilateral Distance:    Right Eye Near:   Left Eye Near:    Bilateral Near:     Physical Exam Vitals and nursing note reviewed.  Constitutional:      General: He is not in acute distress.    Appearance: Normal appearance. He is not ill-appearing.  HENT:     Head: Normocephalic and atraumatic.     Nose: Congestion present.  Eyes:     General:        Right eye: No discharge.        Left eye: No discharge.     Conjunctiva/sclera: Conjunctivae normal.  Cardiovascular:     Rate and Rhythm: Normal rate and regular rhythm.  Pulmonary:     Effort: Pulmonary effort is normal.     Breath sounds: Normal breath sounds. No wheezing, rhonchi or rales.  Neurological:     Mental Status: He is alert.     UC Treatments / Results  Labs (all labs ordered  are listed, but only abnormal results are displayed) Labs Reviewed - No data to display  EKG   Radiology No results found.  Procedures Procedures (including critical care time)  Medications Ordered in UC Medications - No data to display  Initial Impression / Assessment and Plan / UC Course  I have reviewed the triage vital signs and the nursing notes.  Pertinent labs & imaging results that were available during my care of the patient were reviewed by me and considered in my medical decision making (see chart for details).    25 year old male presents with chronic sinusitis and chronic cough.  Has had prior CT confirming sinus disease.  Placing on Augmentin.  Albuterol as needed for chest tightness and shortness of breath.  Also placing on Singulair and Promethazine DM.  Follow-up with PCP to discuss referral to pulmonology and ENT.  Final Clinical Impressions(s) / UC Diagnoses   Final diagnoses:  Chronic cough  Chronic sinusitis, unspecified location     Discharge Instructions      Medications as prescribed.  Follow up with your PCP for referral to pulmonology & ENT.  Take care  Dr. Adriana Simas    ED Prescriptions     Medication Sig Dispense Auth. Provider   amoxicillin-clavulanate (AUGMENTIN) 875-125 MG tablet Take 1 tablet by mouth 2 (two) times daily. 20 tablet Graciano Batson G, DO   albuterol (VENTOLIN HFA) 108 (90 Base) MCG/ACT inhaler Inhale 1-2 puffs into the lungs every 6 (six) hours as needed for wheezing or shortness of breath. 18 g Abimbola Aki G, DO   montelukast (SINGULAIR) 10 MG tablet Take 1 tablet (10 mg total) by mouth at bedtime. 30 tablet Hattie Aguinaldo G, DO   promethazine-dextromethorphan (PROMETHAZINE-DM) 6.25-15 MG/5ML syrup Take 5 mLs by mouth 4 (four) times daily as needed for cough. 118 mL Tommie Sams, DO      PDMP not reviewed this encounter.   Tommie Sams, [place] 05/04/21 1415

## 2021-05-04 NOTE — ED Triage Notes (Signed)
Pt presents today with c/o of nasal congestion dry cough x 2 months. Denies fever.

## 2021-05-15 ENCOUNTER — Telehealth: Payer: Self-pay

## 2021-05-15 ENCOUNTER — Encounter: Payer: Self-pay | Admitting: Internal Medicine

## 2021-05-15 ENCOUNTER — Other Ambulatory Visit: Payer: Self-pay | Admitting: Family Medicine

## 2021-05-15 ENCOUNTER — Telehealth: Payer: Managed Care, Other (non HMO) | Admitting: Internal Medicine

## 2021-05-15 VITALS — Ht 75.0 in | Wt 205.0 lb

## 2021-05-15 DIAGNOSIS — H1010 Acute atopic conjunctivitis, unspecified eye: Secondary | ICD-10-CM

## 2021-05-15 DIAGNOSIS — R059 Cough, unspecified: Secondary | ICD-10-CM

## 2021-05-15 DIAGNOSIS — J309 Allergic rhinitis, unspecified: Secondary | ICD-10-CM

## 2021-05-15 DIAGNOSIS — J320 Chronic maxillary sinusitis: Secondary | ICD-10-CM | POA: Diagnosis not present

## 2021-05-15 MED ORDER — AZELASTINE HCL 0.1 % NA SOLN: 2.0000 | Freq: Two times a day (BID) | NASAL | 12 refills | Status: DC

## 2021-05-15 MED ORDER — MONTELUKAST SODIUM 10 MG PO TABS: 10.0000 mg | ORAL_TABLET | Freq: Every day | ORAL | 1 refills | Status: DC

## 2021-05-15 MED ORDER — PROMETHAZINE-DM 6.25-15 MG/5ML PO SYRP: ORAL_SOLUTION | ORAL | 0 refills | Status: DC

## 2021-05-15 NOTE — Progress Notes (Signed)
[facility] [address] [place], [place] [address]  Internal MEDICINE  Telephone Visit  Patient Name: Leonard Davidson  [id number]  [id number]  Date of Service: 05/19/2021  I connected with the patient at 1024am  by telephone and verified the patients identity using two identifiers.   I discussed the limitations, risks, security and privacy concerns of performing an evaluation and management service by telephone and the availability of in person appointments. I also discussed with the patient that there may be a patient responsible charge related to the service.  The patient expressed understanding and agrees to proceed.    Chief Complaint  Patient presents with   Telephone Assessment    [phone]   Telephone Screen   Cough    Pt went yo the Urgent care 2 weeks ago    Sinusitis    HPI  Pt is connected via video for acute and sick visit, continues to have sinus congestion. Pt has known dx of chronic sinusitis, severe allergies and asthma. He did do allergy testing and was recommended to have immunotherapy however patient has not been compliant with these recommendations. He is seen multiple times for sick visit also utilizes urgent care for the same symptoms. He is requesting a refill on a cough medicine   Current Medication: Outpatient Encounter Medications as of 05/15/2021  Medication Sig   albuterol (VENTOLIN HFA) 108 (90 Base) MCG/ACT inhaler Inhale 1-2 puffs into the lungs every 6 (six) hours as needed for wheezing or shortness of breath.   amoxicillin-clavulanate (AUGMENTIN) 875-125 MG tablet Take 1 tablet by mouth 2 (two) times daily.   azelastine (ASTELIN) 0.1 % nasal spray Place 2 sprays into both nostrils 2 (two) times daily. Use in each nostril as directed   cetirizine (ZYRTEC) 10 MG tablet Take 10 mg by mouth daily.   [DISCONTINUED] montelukast (SINGULAIR) 10 MG tablet Take 1 tablet (10 mg total) by mouth at bedtime.   [DISCONTINUED]  promethazine-dextromethorphan (PROMETHAZINE-DM) 6.25-15 MG/5ML syrup Take 5 mLs by mouth 4 (four) times daily as needed for cough.   montelukast (SINGULAIR) 10 MG tablet Take 1 tablet (10 mg total) by mouth at bedtime.   promethazine-dextromethorphan (PROMETHAZINE-DM) 6.25-15 MG/5ML syrup Use 5 cc bid prn for cough   No facility-administered encounter medications on file as of 05/15/2021.    Surgical History: Past Surgical History:  Procedure Laterality Date   NO PAST SURGERIES      Medical History: Past Medical History:  Diagnosis Date   Bipolar 1 disorder (HCC)    Seasonal allergies     Family History: Family History  Problem Relation Age of Onset   Diabetes Father     Social History   Socioeconomic History   Marital status: Single    Spouse name: Not on file   Number of children: Not on file   Years of education: Not on file   Highest education level: Not on file  Occupational History   Not on file  Tobacco Use   Smoking status: Never   Smokeless tobacco: Never  Vaping Use   Vaping Use: Never used  Substance and Sexual Activity   Alcohol use: Yes    Comment: sometimes   Drug use: Not Currently   Sexual activity: Not on file  Other Topics Concern   Not on file  Social History Narrative   Not on file   Social Determinants of Health   Financial Resource Strain: Not on file  Food Insecurity: Not on file  Transportation  Needs: Not on file  Physical Activity: Not on file  Stress: Not on file  Social Connections: Not on file  Intimate Partner Violence: Not on file      Review of Systems  Constitutional:  Negative for fatigue and fever.  HENT:  Positive for postnasal drip and sinus pressure. Negative for congestion and mouth sores.   Respiratory:  Positive for cough.   Cardiovascular:  Negative for chest pain.  Genitourinary:  Negative for flank pain.  Psychiatric/Behavioral: Negative.     Vital Signs: Ht 6\' 3"  (1.905 m)   Wt 205 lb (93 kg)   BMI  25.62 kg/m    Observation/Objective: Patient sounds congested    Assessment/Plan: 1. Allergic rhinoconjunctivitis Patient is instructed to use all medications as prescribed - montelukast (SINGULAIR) 10 MG tablet; Take 1 tablet (10 mg total) by mouth at bedtime.  Dispense: 90 tablet; Refill: 1 - azelastine (ASTELIN) 0.1 % nasal spray; Place 2 sprays into both nostrils 2 (two) times daily. Use in each nostril as directed  Dispense: 30 mL; Refill: 12  2. Chronic sinusitis of both maxillary sinuses  continue with the antibiotic course as prescribed by urgent care  3. Cough Discussed dependence and was informed that there will not be further refills given - promethazine-dextromethorphan (PROMETHAZINE-DM) 6.25-15 MG/5ML syrup; Use 5 cc bid prn for cough  Dispense: 60 mL; Refill: 0   General Counseling: 01-06-1991 understanding of the findings of today's phone visit and agrees with plan of treatment. I have discussed any further diagnostic evaluation that may be needed or ordered today. We also reviewed his medications today. he has been encouraged to call the office with any questions or concerns that should arise related to todays visit.    No orders of the defined types were placed in this encounter.   Meds ordered this encounter  Medications   montelukast (SINGULAIR) 10 MG tablet    Sig: Take 1 tablet (10 mg total) by mouth at bedtime.    Dispense:  90 tablet    Refill:  1   promethazine-dextromethorphan (PROMETHAZINE-DM) 6.25-15 MG/5ML syrup    Sig: Use 5 cc bid prn for cough    Dispense:  60 mL    Refill:  0   azelastine (ASTELIN) 0.1 % nasal spray    Sig: Place 2 sprays into both nostrils 2 (two) times daily. Use in each nostril as directed    Dispense:  30 mL    Refill:  12    Time spent:15 Minutes    Dr 01-06-1991 Internal medicine

## 2021-05-15 NOTE — Telephone Encounter (Signed)
Spoke with patient about allergy injections. He stated he would think about it and call back-Toni

## 2021-05-22 ENCOUNTER — Other Ambulatory Visit: Payer: Self-pay | Admitting: Family Medicine

## 2021-06-25 ENCOUNTER — Encounter: Payer: Managed Care, Other (non HMO) | Admitting: Physician Assistant

## 2021-12-13 ENCOUNTER — Encounter: Payer: Self-pay | Admitting: Nurse Practitioner

## 2021-12-13 ENCOUNTER — Ambulatory Visit: Payer: BLUE CROSS/BLUE SHIELD | Admitting: Nurse Practitioner

## 2021-12-13 ENCOUNTER — Other Ambulatory Visit: Payer: Self-pay

## 2021-12-13 VITALS — BP 144/65 | HR 80 | Temp 98.8°F | Resp 16 | Ht 75.0 in | Wt 186.6 lb

## 2021-12-13 DIAGNOSIS — R053 Chronic cough: Secondary | ICD-10-CM

## 2021-12-13 DIAGNOSIS — J320 Chronic maxillary sinusitis: Secondary | ICD-10-CM | POA: Diagnosis not present

## 2021-12-13 DIAGNOSIS — J300 Vasomotor rhinitis: Secondary | ICD-10-CM

## 2021-12-13 MED ORDER — MOMETASONE FUROATE 50 MCG/ACT NA SUSP: 2.0000 | Freq: Every day | NASAL | 12 refills | Status: DC

## 2021-12-13 MED ORDER — LEVOCETIRIZINE DIHYDROCHLORIDE 5 MG PO TABS: 5.0000 mg | ORAL_TABLET | Freq: Every evening | ORAL | 2 refills | Status: DC

## 2021-12-13 NOTE — Progress Notes (Signed)
[facility] [address] [place], [place] [address]  Internal MEDICINE  Office Visit Note  Patient Name: Leonard Davidson  [id number]  [id number]  Date of Service: 12/13/2021  Chief Complaint  Patient presents with   Acute Visit   Sinusitis     HPI Leonard Davidson presents for an acute sick visit for symptoms of sinusitis. He reports have runny nose, cough, nasal congestion, post nasal drip, headache, sore throat. He denies any fever, fatigue, chills, body aches, SOB, wheezing or chest tightness. He has had this problem with recurrent sinus infections for 2 years. Symptoms are constant. He has not seen an ENT specialist. He has tried claritin, flonase, benadryl, allegra, cetirizine, singulair, azelastine. He has been using a nettiepott which has helped some. He has been considering getting allergy testing done and getting allergy shots.    Current Medication:  Outpatient Encounter Medications as of 12/13/2021  Medication Sig   albuterol (VENTOLIN HFA) 108 (90 Base) MCG/ACT inhaler Inhale 1-2 puffs into the lungs every 6 (six) hours as needed for wheezing or shortness of breath.   levocetirizine (XYZAL) 5 MG tablet Take 1 tablet (5 mg total) by mouth every evening.   mometasone (NASONEX) 50 MCG/ACT nasal spray Place 2 sprays into the nose daily.   montelukast (SINGULAIR) 10 MG tablet Take 1 tablet (10 mg total) by mouth at bedtime.   [DISCONTINUED] azelastine (ASTELIN) 0.1 % nasal spray Place 2 sprays into both nostrils 2 (two) times daily. Use in each nostril as directed   [DISCONTINUED] cetirizine (ZYRTEC) 10 MG tablet Take 10 mg by mouth daily.   [DISCONTINUED] amoxicillin-clavulanate (AUGMENTIN) 875-125 MG tablet Take 1 tablet by mouth 2 (two) times daily. (Patient not taking: Reported on 12/13/2021)   [DISCONTINUED] promethazine-dextromethorphan (PROMETHAZINE-DM) 6.25-15 MG/5ML syrup Use 5 cc bid prn for cough (Patient not taking: Reported on 12/13/2021)   No  facility-administered encounter medications on file as of 12/13/2021.      Medical History: Past Medical History:  Diagnosis Date   Bipolar 1 disorder (HCC)    Seasonal allergies      Vital Signs: BP (!) 144/65    Pulse 80    Temp 98.8 F (37.1 C)    Resp 16    Ht 6\' 3"  (1.905 m)    Wt 186 lb 9.6 oz (84.6 kg)    SpO2 99%    BMI 23.32 kg/m    Review of Systems  Constitutional:  Negative for appetite change, chills, fatigue and fever.  HENT:  Positive for congestion, rhinorrhea and sneezing. Negative for ear pain, postnasal drip, sinus pressure, sinus pain and sore throat.   Respiratory:  Positive for cough and wheezing. Negative for chest tightness and shortness of breath.   Cardiovascular: Negative.  Negative for chest pain and palpitations.  Gastrointestinal:  Negative for diarrhea, nausea and vomiting.  Musculoskeletal:  Negative for myalgias.  Skin:  Negative for rash.  Neurological:  Negative for dizziness and headaches.   Physical Exam Vitals reviewed.  Constitutional:      General: He is not in acute distress.    Appearance: Normal appearance. He is normal weight. He is not ill-appearing.  HENT:     Head: Normocephalic and atraumatic.     Right Ear: Tympanic membrane, ear canal and external ear normal.     Left Ear: Tympanic membrane, ear canal and external ear normal.     Nose: Congestion and rhinorrhea present.     Mouth/Throat:     Mouth: Mucous membranes  are moist.     Pharynx: Posterior oropharyngeal erythema present. No oropharyngeal exudate.  Eyes:     Pupils: Pupils are equal, round, and reactive to light.  Cardiovascular:     Rate and Rhythm: Normal rate and regular rhythm.     Heart sounds: Normal heart sounds. No murmur heard. Pulmonary:     Effort: Pulmonary effort is normal. No respiratory distress.     Breath sounds: Normal breath sounds. No wheezing.  Lymphadenopathy:     Cervical: Cervical adenopathy present.  Neurological:     Mental Status:  He is alert and oriented to person, place, and time.      Assessment/Plan: 1. Chronic sinusitis of both maxillary sinuses Will try nasonex and xyzal. If still no improvement, will refer to ENT and/or do allergy testing.  - levocetirizine (XYZAL) 5 MG tablet; Take 1 tablet (5 mg total) by mouth every evening.  Dispense: 30 tablet; Refill: 2 - mometasone (NASONEX) 50 MCG/ACT nasal spray; Place 2 sprays into the nose daily.  Dispense: 1 each; Refill: 12  2. Vasomotor rhinitis See problem #1 - levocetirizine (XYZAL) 5 MG tablet; Take 1 tablet (5 mg total) by mouth every evening.  Dispense: 30 tablet; Refill: 2 - mometasone (NASONEX) 50 MCG/ACT nasal spray; Place 2 sprays into the nose daily.  Dispense: 1 each; Refill: 12  3. Chronic cough May use OTC cough suppressant as desired, please follow direction on packaging.    General Counseling: Leonard Davidson understanding of the findings of todays visit and agrees with plan of treatment. I have discussed any further diagnostic evaluation that may be needed or ordered today. We also reviewed his medications today. he has been encouraged to call the office with any questions or concerns that should arise related to todays visit.    Counseling:    No orders of the defined types were placed in this encounter.   Meds ordered this encounter  Medications   levocetirizine (XYZAL) 5 MG tablet    Sig: Take 1 tablet (5 mg total) by mouth every evening.    Dispense:  30 tablet    Refill:  2   mometasone (NASONEX) 50 MCG/ACT nasal spray    Sig: Place 2 sprays into the nose daily.    Dispense:  1 each    Refill:  12    Return if symptoms worsen or fail to improve.  [place] Controlled Substance Database was reviewed by me for overdose risk score (ORS)  Time spent:30 Minutes Time spent with patient included reviewing progress notes, labs, imaging studies, and discussing plan for follow up.   This patient was seen by Sallyanne Kuster, FNP-C  in collaboration with Dr. Beverely Risen as a part of collaborative care agreement.  Shayleen Eppinger R. Tedd Sias, MSN, FNP-C Internal Medicine

## 2021-12-14 ENCOUNTER — Telehealth: Payer: Self-pay

## 2021-12-16 ENCOUNTER — Telehealth: Payer: Self-pay

## 2021-12-16 NOTE — Telephone Encounter (Signed)
Gave FMLA forms to Leonard Davidson on 12/14/21

## 2021-12-20 ENCOUNTER — Telehealth: Payer: Self-pay

## 2021-12-20 NOTE — Telephone Encounter (Signed)
error 

## 2021-12-20 NOTE — Telephone Encounter (Signed)
Spoke to pt and informed him that the paperwork he sent for FMLA for his job stated he didn't qualify and pt advised he was aware but he still needed paperwork completed.  I informed Alyssa and she advised she will try to complete paperwork on 12/21/21.  Pt will also check with his job to make sure he still needs the paperwork completed and will let us know

## 2021-12-24 ENCOUNTER — Telehealth: Payer: Self-pay

## 2021-12-24 NOTE — Telephone Encounter (Signed)
Alyssa completed paperwork for pt for FMLA and I made a copy and gave courtney the original to give to patient.  I called pt and informed him that paperwork was completed and he can pick up at front desk

## 2021-12-24 NOTE — Telephone Encounter (Signed)
Return to work form for leave of absence completed by provider and emailed back to patient. Copy placed with leave of absence form at Desha's desk.

## 2021-12-30 ENCOUNTER — Encounter: Payer: Self-pay | Admitting: Nurse Practitioner

## 2022-04-06 ENCOUNTER — Ambulatory Visit
Admission: EM | Admit: 2022-04-06 | Discharge: 2022-04-06 | Disposition: A | Discharge status: Home / Self Care | Payer: BLUE CROSS/BLUE SHIELD

## 2022-04-06 ENCOUNTER — Encounter: Payer: Self-pay | Admitting: Emergency Medicine

## 2022-04-06 DIAGNOSIS — J4521 Mild intermittent asthma with (acute) exacerbation: Secondary | ICD-10-CM

## 2022-04-06 NOTE — ED Triage Notes (Signed)
Patient c/o chest pain that started this morning.  Patient describes it as tightening in his chest.  Patient denies SOB or difficulty breathing.

## 2022-04-06 NOTE — Discharge Instructions (Addendum)
-  Albuterol inhaler as needed for cough, wheezing, shortness of breath, 1 to 2 puffs every 6 hours as needed. -You can also try a daily acid reducer like pepcid -Make sure to take daily allergy medication for allergic component. Zyrtec or allegra are great over-the-counter options.

## 2022-04-06 NOTE — ED Provider Notes (Signed)
[facility]    CSN: [id number] Arrival date & time: 04/06/22  1400      History   Chief Complaint Chief Complaint  Patient presents with   Chest Pain    HPI Leonard Davidson is a 26 y.o. male presenting with chest tightness with exertion for the last day.  History intermittent asthma, uses albuterol inhaler as needed for this but has not tried it for the current symptoms.  Describes chest tightness, only with ambulation.  This resolves with rest.  There is no positional component, no chest pain with laying flat or sitting.  There is no acute chest pain.  Denies recent URI, but does note nasal congestion and uncontrolled allergic rhinitis.  Denies leg swelling, recent travel, prolonged immobilization.  HPI  Past Medical History:  Diagnosis Date   Bipolar 1 disorder (HCC)    Seasonal allergies     Patient Active Problem List   Diagnosis Date Noted   Encounter for general adult medical examination with abnormal findings 07/05/2020   Acute upper respiratory infection 07/05/2020   Dysuria 07/05/2020   Noncompliance 09/02/2016   Cannabis use disorder, severe, dependence (HCC) 08/16/2016   Bipolar disorder, current episode manic severe with psychotic features (HCC) 08/16/2016    Past Surgical History:  Procedure Laterality Date   NO PAST SURGERIES         Home Medications    Prior to Admission medications   Medication Sig Start Date End Date Taking? Authorizing Provider  INVEGA TRINZA 546 MG/1.75ML injection Inject into the muscle. 03/25/22  Yes [provider]  albuterol (VENTOLIN HFA) 108 (90 Base) MCG/ACT inhaler Inhale 1-2 puffs into the lungs every 6 (six) hours as needed for wheezing or shortness of breath. 05/04/21   Tommie Samsook, Jayce G, DO    Family History Family History  Problem Relation Age of Onset   Diabetes Father     Social History Social History   Tobacco Use   Smoking status: Never   Smokeless tobacco: Never  Vaping Use    Vaping Use: Never used  Substance Use Topics   Alcohol use: Yes    Comment: sometimes   Drug use: Not Currently     Allergies   Patient has no known allergies.   Review of Systems Review of Systems  Respiratory:  Positive for chest tightness.   All other systems reviewed and are negative.   Physical Exam Triage Vital Signs ED Triage Vitals  Enc Vitals Group     BP 04/06/22 1440 (!) 148/72     Pulse Rate 04/06/22 1440 75     Resp 04/06/22 1440 15     Temp 04/06/22 1440 98.9 F (37.2 C)     Temp Source 04/06/22 1440 Oral     SpO2 04/06/22 1440 97 %     Weight 04/06/22 1438 210 lb (95.3 kg)     Height 04/06/22 1438 6\' 3"  (1.905 m)     Head Circumference --      Peak Flow --      Pain Score 04/06/22 1438 5     Pain Loc --      Pain Edu? --      Excl. in GC? --    No data found.  Updated Vital Signs BP (!) 148/72 (BP Location: Right Arm)   Pulse 75   Temp 98.9 F (37.2 C) (Oral)   Resp 15   Ht 6\' 3"  (1.905 m)   Wt 210 lb (95.3 kg)  SpO2 97%   BMI 26.25 kg/m   Visual Acuity Right Eye Distance:   Left Eye Distance:   Bilateral Distance:    Right Eye Near:   Left Eye Near:    Bilateral Near:     Physical Exam Vitals reviewed.  Constitutional:      Appearance: Normal appearance. He is not diaphoretic.  HENT:     Head: Normocephalic and atraumatic.     Mouth/Throat:     Mouth: Mucous membranes are moist.  Eyes:     Extraocular Movements: Extraocular movements intact.     Pupils: Pupils are equal, round, and reactive to light.  Cardiovascular:     Rate and Rhythm: Normal rate and regular rhythm.     Pulses:          Radial pulses are 2+ on the right side and 2+ on the left side.     Heart sounds: Normal heart sounds.  Pulmonary:     Effort: Pulmonary effort is normal.     Breath sounds: Normal breath sounds. No decreased breath sounds, wheezing, rhonchi or rales.  Abdominal:     Palpations: Abdomen is soft.     Tenderness: There is no abdominal  tenderness. There is no guarding or rebound.  Musculoskeletal:     Right lower leg: No edema.     Left lower leg: No edema.  Skin:    General: Skin is warm.     Capillary Refill: Capillary refill takes less than 2 seconds.  Neurological:     General: No focal deficit present.     Mental Status: He is alert and oriented to person, place, and time.  Psychiatric:        Mood and Affect: Mood normal.        Behavior: Behavior normal.        Thought Content: Thought content normal.        Judgment: Judgment normal.     UC Treatments / Results  Labs (all labs ordered are listed, but only abnormal results are displayed) Labs Reviewed - No data to display  EKG   Radiology No results found.  Procedures Procedures (including critical care time)  Medications Ordered in UC Medications - No data to display  Initial Impression / Assessment and Plan / UC Course  I have reviewed the triage vital signs and the nursing notes.  Pertinent labs & imaging results that were available during my care of the patient were reviewed by me and considered in my medical decision making (see chart for details).     This patient is a very pleasant 26 y.o. year old male presenting with chest tightness on exertion for the last day.  Afebrile, nontachycardic, no adventitious breath sounds on exam.  Oxygenating comfortably, nontoxic-appearing.  No recent illness or URI. There is no unilateral leg swelling, acute chest pain, positional chest pain, shortness of breath at rest.  The patient has a long history of mild intermittent asthma, he has not tried his albuterol inhaler for the current symptoms.  He is also not taking daily antihistamine.  Recommended starting Zyrtec or Allegra, and use albuterol inhaler as needed.  He declines refills on this today.  EKG is NSR, compared with EKG from 08/2016.  Work note provided.  Patient and mom verbalized understanding and agreement..   Final Clinical Impressions(s) /  UC Diagnoses   Final diagnoses:  Mild intermittent asthma with acute exacerbation     Discharge Instructions      -Albuterol inhaler  as needed for cough, wheezing, shortness of breath, 1 to 2 puffs every 6 hours as needed. -You can also try a daily acid reducer like pepcid -Make sure to take daily allergy medication for allergic component. Zyrtec or allegra are great over-the-counter options.   ED Prescriptions   None    PDMP not reviewed this encounter.   Rhys Martini, PA-C 04/06/22 [phone]

## 2023-09-27 ENCOUNTER — Emergency Department
Admission: EM | Admit: 2023-09-27 | Discharge: 2023-09-28 | Disposition: A | Discharge status: Other Acute Inpatient Hospital | Payer: MEDICAID | Attending: Emergency Medicine | Admitting: Emergency Medicine

## 2023-09-27 ENCOUNTER — Other Ambulatory Visit: Payer: Self-pay

## 2023-09-27 DIAGNOSIS — F22 Delusional disorders: Secondary | ICD-10-CM | POA: Diagnosis not present

## 2023-09-27 DIAGNOSIS — F209 Schizophrenia, unspecified: Secondary | ICD-10-CM | POA: Diagnosis present

## 2023-09-27 DIAGNOSIS — Y900 Blood alcohol level of less than 20 mg/100 ml: Secondary | ICD-10-CM | POA: Diagnosis not present

## 2023-09-27 DIAGNOSIS — F29 Unspecified psychosis not due to a substance or known physiological condition: Secondary | ICD-10-CM

## 2023-09-27 LAB — URINE DRUG SCREEN, QUALITATIVE (ARMC ONLY)
Amphetamines, Ur Screen: NOT DETECTED
Barbiturates, Ur Screen: NOT DETECTED
Benzodiazepine, Ur Scrn: NOT DETECTED
Cannabinoid 50 Ng, Ur ~~LOC~~: NOT DETECTED
Cocaine Metabolite,Ur ~~LOC~~: NOT DETECTED
MDMA (Ecstasy)Ur Screen: NOT DETECTED
Methadone Scn, Ur: NOT DETECTED
Opiate, Ur Screen: NOT DETECTED
Phencyclidine (PCP) Ur S: NOT DETECTED
Tricyclic, Ur Screen: NOT DETECTED

## 2023-09-27 LAB — COMPREHENSIVE METABOLIC PANEL
ALT: 18 U/L (ref 0–44)
AST: 18 U/L (ref 15–41)
Albumin: 5.2 g/dL — ABNORMAL HIGH (ref 3.5–5.0)
Alkaline Phosphatase: 81 U/L (ref 38–126)
Anion gap: 12 (ref 5–15)
BUN: 17 mg/dL (ref 6–20)
CO2: 24 mmol/L (ref 22–32)
Calcium: 9.3 mg/dL (ref 8.9–10.3)
Chloride: 104 mmol/L (ref 98–111)
Creatinine, Ser: 0.93 mg/dL (ref 0.61–1.24)
GFR, Estimated: >60 mL/min (ref >60)
Glucose, Bld: 96 mg/dL (ref 70–99)
Potassium: 3.7 mmol/L (ref 3.5–5.1)
Sodium: 140 mmol/L (ref 135–145)
Total Bilirubin: 0.9 mg/dL (ref <1.2)
Total Protein: 7.8 g/dL (ref 6.5–8.1)

## 2023-09-27 LAB — ACETAMINOPHEN LEVEL: Acetaminophen (Tylenol), Serum: <10 ug/mL — ABNORMAL LOW (ref 10–30)

## 2023-09-27 LAB — CBC
HCT: 43.3 % (ref 39.0–52.0)
Hemoglobin: 15 g/dL (ref 13.0–17.0)
MCH: 29.6 pg (ref 26.0–34.0)
MCHC: 34.6 g/dL (ref 30.0–36.0)
MCV: 85.6 fL (ref 80.0–100.0)
Platelets: 255 10*3/uL (ref 150–400)
RBC: 5.06 MIL/uL (ref 4.22–5.81)
RDW: 12.6 % (ref 11.5–15.5)
WBC: 5.1 10*3/uL (ref 4.0–10.5)
nRBC: 0 % (ref 0.0–0.2)

## 2023-09-27 LAB — SALICYLATE LEVEL: Salicylate Lvl: <7 mg/dL — ABNORMAL LOW (ref 7.0–30.0)

## 2023-09-27 LAB — ETHANOL: Alcohol, Ethyl (B): <10 mg/dL (ref <10)

## 2023-09-27 MED ORDER — TRAZODONE HCL 50 MG PO TABS: 50.0000 mg | ORAL_TABLET | Freq: Every evening | ORAL | Status: DC | PRN

## 2023-09-27 MED ORDER — PALIPERIDONE ER 3 MG PO TB24
3.0000 mg | ORAL_TABLET | Freq: Every day | ORAL | Status: DC
  Administered 2023-09-27: 3 mg via ORAL
  Filled 2023-09-27 (×2): qty 1

## 2023-09-27 MED ORDER — HYDROXYZINE HCL 25 MG PO TABS: 25.0000 mg | ORAL_TABLET | Freq: Three times a day (TID) | ORAL | Status: DC | PRN

## 2023-09-27 NOTE — ED Notes (Signed)
Important legal documents scanned and e-filed

## 2023-09-27 NOTE — Consult Note (Signed)
Telepsych Consultation   Reason for Consult:  Psychiatry evaluation Referring Physician:  ER Physician Location of Patient:  Location of Provider: [facility]  Patient Identification: Leonard Davidson MRN:  [id number] Principal Diagnosis: Schizophrenia, chronic with acute exacerbation (HCC) Diagnosis:  Principal Problem:   Schizophrenia, chronic with acute exacerbation (HCC)   Total Time spent with patient: 20 minutes  Subjective:   Leonard Davidson is a 27 y.o. male patient admitted with previous hx of Cannabis abuse and Bipolar disorder brought in by his father for for visual hallucination seeing cat that changes to human being.  He has not been on his Psychotropic medications for  years and he had ACT TEAM in the past [facility] and was on Invega injection.  HPI:  Patient is AA Male, 27 years old who was seen via tele psychiatry this morning.  He was calm and cooperative but answered every questions asked of him with "NO"   Patient denied previous hx or diagnosis of Mental illness, he denied ever been seen by a Psychiatrist, he denied taking any Psychotropic in the past.  Patient admitted inpatient Psychiatry hospitalization once in 2017 but states he did not know why he was there.  Patient reports good sleep and appetite, denied SI/HI/AVH.  Patient is fairly groomed and had a scrub on.  He reports that he lives with his paternal grandmother.  UDS is pending, patient denies drug use. Mother, Damaris Hippo was called and she reports that patient has not been himself for the past two weeks.  She reports patient calling her to report seeing cats turning into human beings.  She reports that patient is making videos of her and his father living together.  They have been divorced long time ago and she is married again.  Patient is fixated on religion and Bible and believes he is seeing spirits.  He makes videos of about the Bible  and takes Pictures of the Bible version.   She admits that patient have not been taking Medications and she and family members encouraged him to stop Medication.  For a while patient did well but gradually decompensated.  She reports patient has become disorganized, sometimes confused.  He suddenly, called mom wanting to go stay with her parents when he has been staying with his paternal grandmother.  Mother added patient was receiving invega LAI from [facility]. Patient at this time is cognitively impaired, disorganized and will benefit from inpatient Psychiatry hospitalization.  .We will contact [facility] but this being a weekend may not get anybody.  Patient is prescribed Oral dose of Invega for Psychosis, Trazodone for sleep, Hydroxyzine for anxiety.  We will seek inpatient Psychiatry hospitalization, we will fax records to facilities with available bed.  Past Psychiatric History: previous hx of Cannabis abuse and Bipolar disorder.  One previous inpatient Psychiatry hospitalization 2017.  Was connected with [facility] but have not been seen by them for a while.  Patient was on Invega LAI.  Risk to Self:   Risk to Others:   Prior Inpatient Therapy:   Prior Outpatient Therapy:    Past Medical History:  Past Medical History:  Diagnosis Date   Bipolar 1 disorder (HCC)    Seasonal allergies     Past Surgical History:  Procedure Laterality Date   NO PAST SURGERIES     Family History:  Family History  Problem Relation Age of Onset   Diabetes Father    Family Psychiatric  History: Unknown Social History:  Social History   Substance and Sexual Activity  Alcohol Use Yes   Comment: sometimes     Social History   Substance and Sexual Activity  Drug Use Not Currently    Social History   Socioeconomic History   Marital status: Single    Spouse name: Not on file   Number of children: Not on file   Years of education: Not on file   Highest education level: Not on file  Occupational History   Not on file  Tobacco Use    Smoking status: Never   Smokeless tobacco: Never  Vaping Use   Vaping status: Never Used  Substance and Sexual Activity   Alcohol use: Yes    Comment: sometimes   Drug use: Not Currently   Sexual activity: Not on file  Other Topics Concern   Not on file  Social History Narrative   Not on file   Social Determinants of Health   Financial Resource Strain: Not on file  Food Insecurity: Not on file  Transportation Needs: Not on file  Physical Activity: Not on file  Stress: Not on file  Social Connections: Not on file   Additional Social History:    Allergies:  No Known Allergies  Labs:  Results for orders placed or performed during the hospital encounter of 09/27/23 (from the past 48 hour(s))  Comprehensive metabolic panel     Status: Abnormal   Collection Time: 09/27/23  7:58 AM  Result Value Ref Range   Sodium 140 135 - 145 mmol/L   Potassium 3.7 3.5 - 5.1 mmol/L   Chloride 104 98 - 111 mmol/L   CO2 24 22 - 32 mmol/L   Glucose, Bld 96 70 - 99 mg/dL    Comment: Glucose reference range applies only to samples taken after fasting for at least 8 hours.   BUN 17 6 - 20 mg/dL   Creatinine, Ser 1.61 0.61 - 1.24 mg/dL   Calcium 9.3 8.9 - 09.6 mg/dL   Total Protein 7.8 6.5 - 8.1 g/dL   Albumin 5.2 (H) 3.5 - 5.0 g/dL   AST 18 15 - 41 U/L   ALT 18 0 - 44 U/L   Alkaline Phosphatase 81 38 - 126 U/L   Total Bilirubin 0.9 <1.2 mg/dL   GFR, Estimated >04 >54 mL/min    Comment: (NOTE) Calculated using the CKD-EPI Creatinine Equation (2021)    Anion gap 12 5 - 15    Comment: Performed at [facility], [address] [place], [place] [address]  Ethanol     Status: None   Collection Time: 09/27/23  7:58 AM  Result Value Ref Range   Alcohol, Ethyl (B) <10 <10 mg/dL    Comment: (NOTE) Lowest detectable limit for serum alcohol is 10 mg/dL.  For medical purposes only. Performed at [facility], [address] [place], [place] [address]   Salicylate level      Status: Abnormal   Collection Time: 09/27/23  7:58 AM  Result Value Ref Range   Salicylate Lvl <7.0 (L) 7.0 - 30.0 mg/dL    Comment: Performed at [facility], [address] [place], [place] [address]  Acetaminophen level     Status: Abnormal   Collection Time: 09/27/23  7:58 AM  Result Value Ref Range   Acetaminophen (Tylenol), Serum <10 (L) 10 - 30 ug/mL    Comment: (NOTE) Therapeutic concentrations vary significantly. A range of 10-30 ug/mL  may be an effective concentration for many patients.  However, some  are best treated at concentrations outside of this range. Acetaminophen concentrations >150 ug/mL at 4 hours after ingestion  and >50 ug/mL at 12 hours after ingestion are often associated with  toxic reactions.  Performed at [facility], [address] [place], [place] [address]   cbc     Status: None   Collection Time: 09/27/23  7:58 AM  Result Value Ref Range   WBC 5.1 4.0 - 10.5 K/uL   RBC 5.06 4.22 - 5.81 MIL/uL   Hemoglobin 15.0 13.0 - 17.0 g/dL   HCT 78.2 95.6 - 21.3 %   MCV 85.6 80.0 - 100.0 fL   MCH 29.6 26.0 - 34.0 pg   MCHC 34.6 30.0 - 36.0 g/dL   RDW 08.6 57.8 - 46.9 %   Platelets 255 150 - 400 K/uL   nRBC 0.0 0.0 - 0.2 %    Comment: Performed at [facility], [address], [place], [place] [address]    Medications:  Current Facility-Administered Medications  Medication Dose Route Frequency Provider Last Rate Last Admin   hydrOXYzine (ATARAX) tablet 25 mg  25 mg Oral TID PRN Earney Navy, NP       paliperidone (INVEGA) 24 hr tablet 3 mg  3 mg Oral Daily Iszabella Hebenstreit C, NP   3 mg at 09/27/23 1414   traZODone (DESYREL) tablet 50 mg  50 mg Oral QHS PRN Earney Navy, NP       Current Outpatient Medications  Medication Sig Dispense Refill   albuterol (VENTOLIN HFA) 108 (90 Base) MCG/ACT inhaler Inhale 1-2 puffs into the lungs every 6 (six) hours as needed for wheezing or shortness of breath. (Patient  not taking: Reported on 09/27/2023) 18 g 0   INVEGA TRINZA 546 MG/1.75ML injection Inject into the muscle. (Patient not taking: Reported on 09/27/2023)      Musculoskeletal: Strength & Muscle Tone: within normal limits Gait & Station: normal Patient leans: Front          Psychiatric Specialty Exam:  Presentation  General Appearance:  Casual; N/A  Eye Contact: Fleeting  Speech: Clear and Coherent; Normal Rate  Speech Volume: Normal  Handedness: Right   Mood and Affect  Mood: Euthymic  Affect: Congruent   Thought Process  Thought Processes: Coherent; Linear  Descriptions of Associations:Intact  Orientation:Full (Time, Place and Person)  Thought Content:Illogical; Perseveration  History of Schizophrenia/Schizoaffective disorder:Yes  Duration of Psychotic Symptoms:No data recorded Hallucinations:Hallucinations: None  Ideas of Reference:None  Suicidal Thoughts:Suicidal Thoughts: No  Homicidal Thoughts:Homicidal Thoughts: No   Sensorium  Memory: Immediate Poor; Recent Poor; Remote Poor  Judgment: Impaired  Insight: Lacking   Executive Functions  Concentration: Fair  Attention Span: Fair  Recall: Poor  Fund of Knowledge: Poor  Language: Good   Psychomotor Activity  Psychomotor Activity: Psychomotor Activity: Normal   Assets  Assets: Manufacturing systems engineer; Housing; Physical Health   Sleep  Sleep: Sleep: Fair    Physical Exam: Physical Exam ROS Blood pressure (!) 137/97, pulse 91, temperature 97.9 F (36.6 C), temperature source Oral, resp. rate 19, height 6\' 3"  (1.905 m), weight 95.3 kg, SpO2 97%. Body mass index is 26.26 kg/m.  Treatment Plan Summary: Daily contact with patient to assess and evaluate symptoms and progress in treatment, Medication management, Plan Resume previous medications, and Seek inpatient Psychiatry hospitalization.  Disposition:  Admit seek bed placement.  This service was provided  via telemedicine using a 2-way, interactive audio and video technology.  Names of all  persons participating in this telemedicine service and their role in this encounter. Name: Dahlia Byes,  Role: Psychiatry NP   Name: DR Herma Mering Role: Psychiatrist  Name: Christie Nottingham Role: Counselor       Earney Navy, NP-PMHNP-BC 09/27/2023 2:46 PM

## 2023-09-27 NOTE — ED Notes (Signed)
Pt received evening snack and water

## 2023-09-27 NOTE — BH Assessment (Signed)
Comprehensive Clinical Assessment (CCA) Note  09/27/2023 Leonard Davidson [id number]  Chief Complaint:  Chief Complaint  Patient presents with   Psychiatric Evaluation   Leonard Davidson arrived to the ED by way of personal transportation by his father.  He states "I don't know" when questioned about why he is here today. He denied symptoms of depression or anxiety.  He denied the use of alcohol of drugs. He denied having auditory or visual hallucinations.  He denied suicidal or homicidal ideations or intent.    TTS spoke with Sonnie Alamo (Mother), She reports, "He has been up all night, calling his grandsparents.  Yesterday, He called me telling me I took money out his account.  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 His grandmother called this morning and told me he was going to my parent's house and stay with them for a while.  He has been up at 4 talking to his grandmother.  He is not eating and has lost a lot of weight. He told me that he saw a black cat and you know what that means. He got ready to take a picture of it and it looked like a person and then the cat ran away.  He then started to question me about the cat. My parents new he was coming. He got to my parent's house. His dad went and talked to him. He started to have delusional thoughts about money and his brother. Father gave him an option to come to the hospital or to have police pick him up. He came to the hospital willingly.  He is making statements about faulty things such as if the wall is yellow, he would say its blue. He is taking things personal as though everything people do is related to him.  "God is gonna punish people and they gonna feel the rift  TTS spoke with Darlina Rumpf - [phone] of [employer] contacted the ED and informed TTS, "Leonard Davidson was on [place] for many years.  He has been refusing all medications for about a year. At first he was okay, he has  been getting more agitated and paranoid.  Now he is hyper religious, grandiose, and has delusions of being chosen by God to do various tasks".  She further informed the assessor "God was telling him to deliver his mother's parents to God." (Homicidal ideations).  "He lives with his paternal grandmother and told her he was going to deliver his grandparents to God".  The family got together and prevented him from going anywhere. His father then brought him to the hospital".      TTS Spoke to [employer] his psychiatrist, from [place], She reports, "Leonard Davidson has no history of violence. He has made threats against the family in the past, at least 5 years ago. He has been off his medication for a year. He not at his baseline. He was hyper religious on Thursday, he was posting video's on line.  He has been refusing lately to meet with his ACTteam.  Family reported that he has been more active and he is not a religious person.  He has been fasting and working out a lot recently.  He has lost about 20 pounds in the past month.  He has been on [place] in the past.    IVC paperwork reports " Patient is paranoid and hallucinating, not sleeping . Concerns for a psychotic break.  Visit Diagnosis: Schizophrenia,  Undifferentiated   CCA Screening, Triage and Referral (STR)  Patient Reported Information How did you hear about [place]? Family/Friend  Referral name: No data recorded Referral phone number: No data recorded  Whom do you see for routine medical problems? No data recorded Practice/Facility Name: No data recorded Practice/Facility Phone Number: No data recorded Name of Contact: No data recorded Contact Number: No data recorded Contact Fax Number: No data recorded Prescriber Name: No data recorded Prescriber Address (if known): No data recorded  What Is the Reason for Your Visit/Call Today? Delusions  How Long Has This Been Causing You Problems? 1-6 months  What Do You Feel Would Help  You the Most Today? Support for unsafe relationship   Have You Recently Been in Any Inpatient Treatment (Hospital/Detox/Crisis Center/28-Day Program)? No data recorded Name/Location of Program/Hospital:No data recorded How Long Were You There? No data recorded When Were You Discharged? No data recorded  Have You Ever Received Services From [facility] Before? No data recorded Who Do You See at [facility]? No data recorded  Have You Recently Had Any Thoughts About Hurting Yourself? No  Are You Planning to Commit Suicide/Harm Yourself At This time? No   Have you Recently Had Thoughts About Hurting Someone Karolee Ohs? No  Explanation: No data recorded  Have You Used Any Alcohol or Drugs in the Past 24 Hours? No  How Long Ago Did You Use Drugs or Alcohol? No data recorded What Did You Use and How Much? No data recorded  Do You Currently Have a Therapist/Psychiatrist? Yes (Patient denied, but Frederich Chick contacted TTS prior to arrival)  Name of Therapist/Psychiatrist: [facility]   Have You Been Recently Discharged From Any Public relations account executive or Programs? No  Explanation of Discharge From Practice/Program: No data recorded    CCA Screening Triage Referral Assessment Type of Contact: Face-to-Face  Is this Initial or Reassessment? No data recorded Date Telepsych consult ordered in [facility]:  No data recorded Time Telepsych consult ordered in [facility]:  No data recorded  Patient Reported Information Reviewed? No data recorded Patient Left Without Being Seen? No data recorded Reason for Not Completing Assessment: No data recorded  Collateral Involvement: [place], [place] [id number]   Does Patient Have a Court Appointed Legal Guardian? No data recorded Name and Contact of Legal Guardian: No data recorded If Minor and Not Living with Parent(s), Who has Custody? No data recorded Is CPS involved or ever been involved? Never  Is APS involved or ever been involved?  Never   Patient Determined To Be At Risk for Harm To Self or Others Based on Review of Patient Reported Information or Presenting Complaint? Yes, for Harm to Others  Method: No data recorded Availability of Means: No access or NA  Intent: -- (Delusions to follow God's instructions)  Notification Required: No data recorded Additional Information for Danger to Others Potential: No data recorded Additional Comments for Danger to Others Potential: No data recorded Are There Guns or Other Weapons in Your Home? No  Types of Guns/Weapons: No data recorded Are These Weapons Safely Secured?                            No data recorded Who Could Verify You Are Able To Have These Secured: No data recorded Do You Have any Outstanding Charges, Pending Court Dates, Parole/Probation? No data recorded Contacted To Inform of Risk of Harm To Self or Others: Family/Significant Other:  Location of Assessment: [facility]   Does Patient Present under Involuntary Commitment? Yes  IVC Papers Initial File Date: No data recorded  [place] of Residence: [place]   Patient Currently Receiving the Following Services: No data recorded  Determination of Need: Emergent (2 hours)   Options For Referral: Inpatient Hospitalization     CCA Biopsychosocial Intake/Chief Complaint:  No data recorded Current Symptoms/Problems: No data recorded  Patient Reported Schizophrenia/Schizoaffective Diagnosis in Past: No   Strengths: playing video game and being "real'  Preferences: No data recorded Abilities: No data recorded  Type of Services Patient Feels are Needed: No data recorded  Initial Clinical Notes/Concerns: No data recorded  Mental Health Symptoms Depression:   Sleep (too much or little)   Duration of Depressive symptoms: No data recorded  Mania:  No data recorded  Anxiety:    N/A   Psychosis:   Hallucinations; Delusions   Duration of Psychotic symptoms: No data recorded  Trauma:    N/A   Obsessions:   N/A   Compulsions:   N/A   Inattention:   N/A   Hyperactivity/Impulsivity:   N/A   Oppositional/Defiant Behaviors:   N/A   Emotional Irregularity:   Intense/unstable relationships   Other Mood/Personality Symptoms:  No data recorded   Mental Status Exam Appearance and self-care  Stature:   Tall   Weight:   Thin   Clothing:   Casual   Grooming:   Normal   Cosmetic use:   None   Posture/gait:   Normal   Motor activity:   Not Remarkable   Sensorium  Attention:   Normal   Concentration:   Normal   Orientation:   X5   Recall/memory:   Normal   Affect and Mood  Affect:   Flat   Mood:   Irritable   Relating  Eye contact:   Normal   Facial expression:   Constricted   Attitude toward examiner:   Cooperative   Thought and Language  Speech flow:  Normal   Thought content:  No data recorded  Preoccupation:  No data recorded  Hallucinations:   Auditory   Organization:  No data recorded  [employer] of Knowledge:   Average   Intelligence:   Average   Abstraction:  No data recorded  Judgement:   Fair   Reality Testing:   Adequate   Insight:   Fair   Decision Making:   Vacilates   Social Functioning  Social Maturity:   Isolates   Social Judgement:   Naive   Stress  Stressors:  No data recorded  Coping Ability:   Human resources officer Deficits:  No data recorded  Supports:  No data recorded    Religion: Religion/Spirituality Are You A Religious Person?: Yes What is Your Religious Affiliation?: [employer]  Leisure/Recreation: Leisure / Recreation Do You Have Hobbies?: Yes Leisure and Hobbies: Photographer, play basketball, play music  Exercise/Diet: Exercise/Diet Do You Exercise?: Yes How Many Times a Week Do You Exercise?: 4-5 times a week Have You Gained or Lost A Significant Amount of Weight in the Past Six Months?: No Do You Follow a Special Diet?: No Do  You Have Any Trouble Sleeping?: Yes (has vivid dreams) Explanation of Sleeping Difficulties: Sleeping too much   CCA Employment/Education Employment/Work Situation: Employment / Work Situation Employment Situation: Unemployed Patient's Job has Been Impacted by Current Illness: No Has Patient ever Been in Equities trader?: No  Education: Education Is Patient Currently Attending  School?: No Last Grade Completed: 12 Did You Attend College?: No Did You Have An Individualized Education Program (IIEP): No Did You Have Any Difficulty At School?: No   CCA Family/Childhood History Family and Relationship History: Family history Does patient have children?: No  Childhood History:  Childhood History By whom was/is the patient raised?: Mother Did patient suffer any verbal/emotional/physical/sexual abuse as a child?: No Has patient ever been sexually abused/assaulted/raped as an adolescent or adult?: No Witnessed domestic violence?: No Has patient been affected by domestic violence as an adult?: No  Child/Adolescent Assessment:     CCA Substance Use Alcohol/Drug Use: Alcohol / Drug Use Pain Medications: SEE MAR Prescriptions: SEE MAR Over the Counter: SEE MAR History of alcohol / drug use?: No history of alcohol / drug abuse                         ASAM's:  Six Dimensions of Multidimensional Assessment  Dimension 1:  Acute Intoxication and/or Withdrawal Potential:      Dimension 2:  Biomedical Conditions and Complications:      Dimension 3:  Emotional, Behavioral, or Cognitive Conditions and Complications:     Dimension 4:  Readiness to Change:     Dimension 5:  Relapse, Continued use, or Continued Problem Potential:     Dimension 6:  Recovery/Living Environment:     ASAM Severity Score:    ASAM Recommended Level of Treatment:     Substance use Disorder (SUD)    Recommendations for Services/Supports/Treatments: Recommendations for  Services/Supports/Treatments Recommendations For Services/Supports/Treatments: Individual Therapy, Medication Management  DSM5 Diagnoses: Patient Active Problem List   Diagnosis Date Noted   Encounter for general adult medical examination with abnormal findings 07/05/2020   Acute upper respiratory infection 07/05/2020   Dysuria 07/05/2020   Noncompliance 09/02/2016   Cannabis use disorder, severe, dependence (HCC) 08/16/2016   Bipolar disorder, current episode manic severe with psychotic features (HCC) 08/16/2016      @[email or web address] @  [place], Counselor

## 2023-09-27 NOTE — ED Triage Notes (Signed)
Pt states he's having an "episode". States he needs to be admitted. Pt is unable (unwilling?) to tell me what prompted his dad to bring him to the ED. Patient initially states he is having thoughts of harming himself and that he is having hallucinations, followed by stating that he will say whatever he needs to in order to be admitted. However, he answered the [place] screening with all "no" answers.

## 2023-09-27 NOTE — ED Notes (Signed)
Pt currently talking to tele Psych

## 2023-09-27 NOTE — ED Notes (Signed)
Pt has been accepted to [facility] however there is no [facility] transport tonight

## 2023-09-27 NOTE — ED Notes (Signed)
Patient transferred to room 2, He is calm, voiced understanding of orientation to the unit, Nurse let him know that we needed a urine sample and He agreed. No signs of distress at this time.

## 2023-09-27 NOTE — ED Notes (Signed)
Pt has been accepted to [facility] however there is no [facility] transport tonight, bed to be held til tomorrow morning 11.24.24 per TTS

## 2023-09-27 NOTE — ED Notes (Signed)
This nursed asked for a urine sample from pt. Pt stated " I don't have to pee right now, I peed a hour ago" this nurse asked pt to let her know when he did have to go pee. Pt verbalized understanding and asked when he could leave. This nurse informed the pt that we are not sure at the moment but we would keep him updated. NAD. Pt is sitting calmly on bed.

## 2023-09-27 NOTE — ED Provider Notes (Signed)
[facility] Provider Note    Event Date/Time   First MD Initiated Contact with Patient 09/27/23 1000     (approximate)  History   Chief Complaint: Psychiatric Evaluation  HPI  Leonard Davidson is a 27 y.o. male with a past medical history of bipolar, presents to the emergency department for evaluation.  According to triage patient states he needed to be admitted due to having "an episode."  To myself patient denies any this.  States he does not know why he is here but he has no complaints.  He denies any medical complaints.  Denies any SI or HI.  Physical Exam   Triage Vital Signs: ED Triage Vitals  Encounter Vitals Group     BP 09/27/23 0751 (!) 137/97     Systolic BP Percentile --      Diastolic BP Percentile --      Pulse Rate 09/27/23 0751 91     Resp 09/27/23 0751 19     Temp 09/27/23 0751 97.9 F (36.6 C)     Temp Source 09/27/23 0751 Oral     SpO2 09/27/23 0751 97 %     Weight 09/27/23 0752 210 lb 1.6 oz (95.3 kg)     Height 09/27/23 0752 6\' 3"  (1.905 m)     Head Circumference --      Peak Flow --      Pain Score 09/27/23 0752 0     Pain Loc --      Pain Education --      Exclude from Growth Chart --     Most recent vital signs: Vitals:   09/27/23 0751  BP: (!) 137/97  Pulse: 91  Resp: 19  Temp: 97.9 F (36.6 C)  SpO2: 97%    General: Awake, no distress.  CV:  Good peripheral perfusion.  Regular rate and rhythm  Resp:  Normal effort.  Equal breath sounds bilaterally.  Abd:  No distention.  Soft, nontender.  No rebound or guarding.   ED Results / Procedures / Treatments   MEDICATIONS ORDERED IN ED: Medications - No data to display   IMPRESSION / MDM / ASSESSMENT AND PLAN / ED COURSE  I reviewed the triage vital signs and the nursing notes.  Patient's presentation is most consistent with acute presentation with potential threat to life or bodily function.  Patient presents to the emergency department for what he  referred to as "an episode."  However during my evaluation he denies any psychiatric complaints.  Denies any medical complaints.  States someone brought him here because they wanted him evaluated, but declines to say who brought him.  Patient lab work is reassuring with a normal chemistry normal ethanol salicylate and acetaminophen.  Normal CBC.  Patient states he lives with his grandmother.  Will attempt to contact her for further history.  So far unable to reach family.  I was able to talk to the patient's mother.  She states patient has been off of his medications for 1 year.  For the last 2 weeks he has been very paranoid and, stating that people are stealing his money and that they are staring at him.  Mom states he has been speaking very religiously lately about God which is atypical.  States he did not sleep last night.  Mom also states for the last couple months he has lost approximately 20 pounds.  Mom states patient has been seeing a cat where there is no cat.  Given these  new facts, concern for possible acute psychosis or psychotic break.  Will place patient under an IVC until psychiatry can adequately evaluate.    FINAL CLINICAL IMPRESSION(S) / ED DIAGNOSES   Paranoia Hallucinations   Note:  This document was prepared using Dragon voice recognition software and may include unintentional dictation errors.   Minna Antis, MD 09/28/23 1431

## 2023-09-27 NOTE — ED Notes (Addendum)
Pt pacing in the triage middle area, asking when he would get a room. Explained there aren't any rooms in the back at this time and that we working on some movement. Pt states, "is it tailored for me? Because it shouldn't take this long." Reiterated that there isn't beds at the moment but we were trying to make a room available. Charge RN, Marylene Land made aware

## 2023-09-27 NOTE — ED Notes (Signed)
Patient has been accepted to [facility].  Patient assigned to room 505-1 Accepting physician is Dr. Dr. Sherron Flemings.  Call report to [phone].  Representative was [employer].   ER Staff is aware of it:  [facility] ER Secretary  Dr. Lenard Lance, ER MD  Melody Patient's Nurse

## 2023-09-28 ENCOUNTER — Inpatient Hospital Stay (HOSPITAL_COMMUNITY)
Admission: AD | Admit: 2023-09-28 | Discharge: 2023-10-05 | DRG: 885 | Disposition: A | Discharge status: Home / Self Care | Payer: MEDICAID | Source: Intra-hospital | Attending: Psychiatry | Admitting: Psychiatry

## 2023-09-28 ENCOUNTER — Other Ambulatory Visit: Payer: Self-pay

## 2023-09-28 ENCOUNTER — Encounter (HOSPITAL_COMMUNITY): Payer: Self-pay | Admitting: Nurse Practitioner

## 2023-09-28 DIAGNOSIS — Z560 Unemployment, unspecified: Secondary | ICD-10-CM

## 2023-09-28 DIAGNOSIS — Z833 Family history of diabetes mellitus: Secondary | ICD-10-CM

## 2023-09-28 DIAGNOSIS — F203 Undifferentiated schizophrenia: Principal | ICD-10-CM

## 2023-09-28 DIAGNOSIS — F209 Schizophrenia, unspecified: Secondary | ICD-10-CM | POA: Diagnosis present

## 2023-09-28 MED ORDER — OLANZAPINE 10 MG IM SOLR: 5.0000 mg | Freq: Three times a day (TID) | INTRAMUSCULAR | Status: DC | PRN

## 2023-09-28 MED ORDER — ACETAMINOPHEN 325 MG PO TABS: 650.0000 mg | ORAL_TABLET | Freq: Four times a day (QID) | ORAL | Status: DC | PRN

## 2023-09-28 MED ORDER — OLANZAPINE 5 MG PO TBDP: 5.0000 mg | ORAL_TABLET | Freq: Three times a day (TID) | ORAL | Status: DC | PRN

## 2023-09-28 MED ORDER — PALIPERIDONE ER 3 MG PO TB24
3.0000 mg | ORAL_TABLET | Freq: Every day | ORAL | Status: DC
  Administered 2023-09-28 – 2023-09-30 (×3): 3 mg via ORAL
  Filled 2023-09-28 (×6): qty 1

## 2023-09-28 MED ORDER — TRAZODONE HCL 50 MG PO TABS
50.0000 mg | ORAL_TABLET | Freq: Every evening | ORAL | Status: DC | PRN
  Administered 2023-09-30 – 2023-10-04 (×5): 50 mg via ORAL
  Filled 2023-09-28 (×2): qty 1
  Filled 2023-09-28: qty 5
  Filled 2023-09-28 (×5): qty 1

## 2023-09-28 MED ORDER — MAGNESIUM HYDROXIDE 400 MG/5ML PO SUSP
30.0000 mL | Freq: Every day | ORAL | Status: DC | PRN
Start: 2023-09-28 — End: 2023-10-05

## 2023-09-28 MED ORDER — HYDROXYZINE HCL 25 MG PO TABS
25.0000 mg | ORAL_TABLET | Freq: Three times a day (TID) | ORAL | Status: DC | PRN
  Administered 2023-10-03 – 2023-10-04 (×2): 25 mg via ORAL
  Filled 2023-09-28 (×5): qty 1

## 2023-09-28 MED ORDER — ALUM & MAG HYDROXIDE-SIMETH 200-200-20 MG/5ML PO SUSP
30.0000 mL | ORAL | Status: DC | PRN
Start: 2023-09-28 — End: 2023-10-05

## 2023-09-28 MED ORDER — OLANZAPINE 10 MG IM SOLR: 10.0000 mg | Freq: Three times a day (TID) | INTRAMUSCULAR | Status: DC | PRN

## 2023-09-28 NOTE — Progress Notes (Signed)
Patient ID: Leonard Davidson, male   DOB: 1996/09/30, 27 y.o.   MRN: [id number]   Initial Treatment Plan 09/28/2023 11:01 AM Anselm Pancoast Kimball HQI:[id number]    PATIENT STRESSORS: Medication change or noncompliance     PATIENT STRENGTHS: Communication skills  Religious Affiliation  Supportive family/friends    PATIENT IDENTIFIED PROBLEMS: Non-med compliant  Delusions  Anxiety                 DISCHARGE CRITERIA:  Improved stabilization in mood, thinking, and/or behavior Reduction of life-threatening or endangering symptoms to within safe limits Verbal commitment to aftercare and medication compliance  PRELIMINARY DISCHARGE PLAN: Outpatient therapy Return to previous living arrangement Return to previous work or school arrangements  PATIENT/FAMILY INVOLVEMENT: This treatment plan has been presented to and reviewed with the patient, Leonard Davidson, and/or family member.  The patient and family have been given the opportunity to ask questions and make suggestions.  Tania Ade, RN 09/28/2023, 11:01 AM

## 2023-09-28 NOTE — ED Notes (Signed)
Pt provided with breakfast

## 2023-09-28 NOTE — ED Notes (Signed)
Pt accepted to [facility], possible transport this am

## 2023-09-28 NOTE — BHH Group Notes (Signed)
Did not attend group, but kept checking in for snacks during group.

## 2023-09-28 NOTE — Plan of Care (Signed)
Problem: Education: Goal: Emotional status will improve Outcome: Progressing Goal: Mental status will improve Outcome: Progressing Goal: Verbalization of understanding the information provided will improve Outcome: Progressing   Problem: Activity: Goal: Interest or engagement in activities will improve Outcome: Progressing

## 2023-09-28 NOTE — BHH Suicide Risk Assessment (Signed)
[facility] Admission Suicide Risk Assessment   Nursing information obtained from:  Patient Demographic factors:  Male Current Mental Status:  NA Loss Factors:  NA Historical Factors:  Impulsivity, Prior suicide attempts Risk Reduction Factors:  Living with another person, especially a relative  Total Time spent with patient: 30 minutes Principal Problem: Schizophrenia, acute undifferentiated (HCC) Diagnosis:  Principal Problem:   Schizophrenia, acute undifferentiated (HCC)  Subjective Data: Leonard Davidson is a 27 y.o. male patient admitted with previous hx of Cannabis abuse and Bipolar disorder brought in by his father for for visual hallucination seeing cat that changes to human being.  He has not been on his Psychotropic medications for  years and he had ACT TEAM in the past [facility] and was on Invega injection.   Continued Clinical Symptoms:    The "Alcohol Use Disorders Identification Test", Guidelines for Use in Primary Care, Second Edition.  World Science writer [facility]). Score between 0-7:  no or low risk or alcohol related problems. Score between 8-15:  moderate risk of alcohol related problems. Score between 16-19:  high risk of alcohol related problems. Score 20 or above:  warrants further diagnostic evaluation for alcohol dependence and treatment.   CLINICAL FACTORS:   Bipolar Disorder:   Mixed State Alcohol/Substance Abuse/Dependencies Currently Psychotic   Musculoskeletal: Strength & Muscle Tone: within normal limits Gait & Station: normal Patient leans: N/A  Psychiatric Specialty Exam:  Presentation  General Appearance:  Casual; N/A  Eye Contact: Fleeting  Speech: Clear and Coherent; Normal Rate  Speech Volume: Normal  Handedness: Right   Mood and Affect  Mood: Euthymic  Affect: Congruent   Thought Process  Thought Processes: Coherent; Linear  Descriptions of Associations:Intact  Orientation:Full (Time, Place and  Person)  Thought Content:Illogical; Perseveration  History of Schizophrenia/Schizoaffective disorder:Yes  Duration of Psychotic Symptoms:No data recorded Hallucinations:Hallucinations: None  Ideas of Reference:None  Suicidal Thoughts:Suicidal Thoughts: No  Homicidal Thoughts:Homicidal Thoughts: No   Sensorium  Memory: Immediate Poor; Recent Poor; Remote Poor  Judgment: Impaired  Insight: Lacking   Executive Functions  Concentration: Fair  Attention Span: Fair  Recall: Poor  Fund of Knowledge: Poor  Language: Good   Psychomotor Activity  Psychomotor Activity: Psychomotor Activity: Normal   Assets  Assets: Manufacturing systems engineer; Housing; Physical Health   Sleep  Sleep: Sleep: Fair    Physical Exam: Physical Exam Constitutional:      Appearance: Normal appearance. He is normal weight.  HENT:     Head: Normocephalic.  Neurological:     General: No focal deficit present.     Mental Status: He is alert and oriented to person, place, and time. Mental status is at baseline.  Psychiatric:        Mood and Affect: Mood normal.        Behavior: Behavior normal.    Review of Systems  Psychiatric/Behavioral:  The patient is nervous/anxious.   All other systems reviewed and are negative.  Blood pressure 136/77, pulse 77, temperature 97.6 F (36.4 C), temperature source Oral, resp. rate 20, height 6\' 3"  (1.905 m), weight 74.4 kg, SpO2 100%. Body mass index is 20.5 kg/m.   COGNITIVE FEATURES THAT CONTRIBUTE TO RISK:  Polarized thinking and Thought constriction (tunnel vision)    SUICIDE RISK:   Moderate:  Frequent suicidal ideation with limited intensity, and duration, some specificity in terms of plans, no associated intent, good self-control, limited dysphoria/symptomatology, some risk factors present, and identifiable protective factors, including available and accessible social support.  PLAN OF  CARE: The patient is admitted to the [facility] on an  involuntary status.  The IVC will  be upheld.  I certify that inpatient services furnished can reasonably be expected to improve the patient's condition.   Rex Kras, MD 09/28/2023, 12:03 PM

## 2023-09-28 NOTE — ED Notes (Signed)
EMTALA Reviewed by this RN.  

## 2023-09-28 NOTE — B[facility] Counselor (Signed)
Adult Comprehensive Assessment  Patient ID: Leonard Davidson, male   DOB: Dec 08, 1995, 27 y.o.   MRN: [id number]  Information Source: Information source: Patient  Current Stressors:  Patient states their primary concerns and needs for treatment are:: Pt states that he had a mental breakdown and that her was under stress which led his family members to be overly concerned. Patient states their goals for this hospitilization and ongoing recovery are:: Pt states he would like to get out of here Educational / Learning stressors: Pt has hopes to pursue school in 2025 for film making Employment / Job issues: Currently not employed Family Relationships: Pt states that family is supportive.  Lives with his grandmother Surveyor, quantity / Lack of resources (include bankruptcy): Pt states that he would like financial and housing resources Housing / Lack of housing: Pt currently lives with grandmother Physical health (include injuries & life threatening diseases): no stressors Social relationships: patient states that he has a limited support system Substance abuse: patient denies Bereavement / Loss: patient states he grieves the loss of grandfather 4 years ago  Living/Environment/Situation:  Living Arrangements: Parent, Other relatives Living conditions (as described by patient or guardian): Pt states that he has 2 rooms at his grandmothers including a room to sleep in and 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 Who else lives in the home?: grandmother How long has patient lived in current situation?: 6 years What is atmosphere in current home: Comfortable, Paramedic, Supportive  Family History:  Marital status: Single Are you sexually active?: No What is your sexual orientation?: heterosexua Has your sexual activity been affected by drugs, alcohol, medication, or emotional stress?: denies Does patient have children?: No  Childhood History:  By whom was/is the patient raised?: Both parents Additional  childhood history information: Parents divorced when he was a small child Description of patient's relationship with caregiver when they were a child: Mother: good relationship, supportive, loving Father: minimal; biweekly visitation on weekends Patient's description of current relationship with people who raised him/her: Pt states that he has a good relationship with his mother, father and grandmother. How were you disciplined when you got in trouble as a child/adolescent?: punished Did patient suffer any verbal/emotional/physical/sexual abuse as a child?: No Did patient suffer from severe childhood neglect?: No Has patient ever been sexually abused/assaulted/raped as an adolescent or adult?: No Was the patient ever a victim of a crime or a disaster?: No Has patient been affected by domestic violence as an adult?: No  Education:  Highest grade of school patient has completed: completed high school Currently a student?: No Learning disability?: No  Employment/Work Situation:   Employment Situation: Unemployed Patient's Job has Been Impacted by Current Illness: No What is the Longest Time Patient has Held a Job?: 2.5 years Where was the Patient Employed at that Time?: [employer] Has Patient ever Been in the [employer]?: No  Financial Resources:   Surveyor, quantity resources: Support from parents / caregiver Does patient have a Lawyer or guardian?: No  Alcohol/Substance Abuse:   What has been your use of drugs/alcohol within the last 12 months?: none If attempted suicide, did drugs/alcohol play a role in this?: No Alcohol/Substance Abuse Treatment Hx: Denies past history Has alcohol/substance abuse ever caused legal problems?: No  Social Support System:   Conservation officer, nature Support System: Fair Describe Community Support System: patient states that he has limited peer support.  Patient states main support comes from family Type of faith/religion: DNA How does patient's faith  help to  cope with current illness?: DNA  Leisure/Recreation:   Do You Have Hobbies?: Yes Leisure and Hobbies: edit videos, play basketball, play music, watching sports,  Strengths/Needs:   What is the patient's perception of their strengths?: Intelligent, Charismatic, outgoing and funny Patient states they can use these personal strengths during their treatment to contribute to their recovery: yes Patient states these barriers may affect/interfere with their treatment: none Patient states these barriers may affect their return to the community: none Other important information patient would like considered in planning for their treatment: Pt does not want to be connected to psychiatrist/ counselor at this time  Discharge Plan:   Currently receiving community mental health services: No Patient states they will know when they are safe and ready for discharge when: Pt states that he feels ready for discharge Does patient have access to transportation?: Yes Does patient have financial barriers related to discharge medications?: No Plan for living situation after discharge: Grandmothers Will patient be returning to same living situation after discharge?: Yes  Summary/Recommendations:   Summary and Recommendations (to be completed by the evaluator): Leonard Davidson is a 27 year old male who was admitted to [facility] for bizarre behavior and concerns from family members. Pt states that he was going through some stress and had a mental breakdown.  Pt has a past psychiatric history of schizophrenia.  He has not been on medication for a while and feels like he has been relatively stable.  Pt is not interested in outpatient psychiatry or counseling at this time.  Leonard Davidson denies any substance use at this time.  He is currently not employed and lives with his grandmother. Pt has good family support.  Pt is interested in affordable housing resources and financial assistance.  While here, pt can benefit from ongoing  engagement in services through referrals for services, medication management, participation in the milieu and ongoing treatment for services.  Leonard Davidson E Seini Lannom. 09/28/2023

## 2023-09-28 NOTE — ED Provider Notes (Signed)
Emergency Medicine Observation Re-evaluation Note  Leonard Davidson is a 27 y.o. male, seen on rounds today.   Physical Exam  BP 129/75 (BP Location: Right Arm)   Pulse 74   Temp 97.9 F (36.6 C) (Oral)   Resp 19   Ht 6\' 3"  (1.905 m)   Wt 95.3 kg   SpO2 97%   BMI 26.26 kg/m  Physical Exam General: NAD  ED Course / MDM  EKG:   I have reviewed the labs performed to date as well as medications administered while in observation.   Plan  Current plan is for psych/toc.    Willy Eddy, MD 09/28/23 [phone]

## 2023-09-28 NOTE — H&P (Signed)
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  The patient was off his psychiatric medications for years and apparently in the past she was on Invega injection and had an ACT team.  History of Present Illness: The patient is a 27 year old male who reports that he was having a breakdown.  He was seen on telepsychiatry and at that time he was calm and cooperative and denied all symptoms but admitted to having a nervous breakdown.  He reports that he was under a lot of stress.  And according to the father he was having active hallucinations and saw a cat currently human being.  He was quite overwhelmed.  Urine tox screen was negative for substances of abuse. According to the collateral information obtained prior to admission the patient's mother reported that he has not been himself for the past 2 weeks.  She reported that he was making good use of her and his father living together and they have been divorced for a long time and she is remarried.  Patient is also fixated on religion and Bible and believes that he is seeing spirits.  He has also been making videos or the Bible and taking pictures of the Bible version.  He has been decompensating gradually over the last several weeks. He has not had Invega for several months.  Patient does have a prior history of hospitalization in 2017 and upon discharge was being followed by the ACT team.  Patient reports that he stopped all his  medications several months ago.  He now lives with his grandmother and reports that his parents are divorced.  He has a brother who is supportive.  Patient reports that he wants to be his own boss and he wants to be Database administrator.  When asked what he wanted to do, he states that he wanted to do everything.  He wanted to make movies, he wants to make videos and he wants to go to school and he wants to have multiple jobs.  He is currently unemployed and is not earning but states that he has aspirations to be his own boss.  His grandmother is very supportive.  When seen today the patient was noted to be a tall lanky 27 year old male who maintained fair eye contact.  He was quite hypervigilant and guarded.  He states that he came here because he was overwhelmed and stressed out and had a nervous breakdown.  However he is unable to describe his symptoms.  He states that he is not hallucinating and is not delusional and he reports that he is not having any suicidal or homicidal thoughts.  He is contracting for safety and is willing to seek help.  Associated Signs/Symptoms: Depression Symptoms:  depressed mood, psychomotor retardation, (Hypo) Manic Symptoms:  Delusions, Distractibility, Hallucinations, Impulsivity, Irritable Mood, Anxiety Symptoms:  Excessive Worry, Psychotic Symptoms:  Hallucinations: Visual PTSD Symptoms: NA Total Time spent with patient: 30 minutes  Past Psychiatric History: Patient gives a positive history of cannabinoid abuse and bipolar disorder.  He had 1 prior hospitalization in 2017.  He was connected  with [facility] and had an ACT team and was on Invega only LAI but stopped everything several months ago.  He is currently not under anyone's care at this time.  Review of the [facility] records indicate that that was the third admission for this patient.  Apparently he was on Invega and Depakote at the time of discharge in 2017.  Somehow that was changed  to Abilify as an outpatient which she has discontinued.  Is the patient at risk to self? Yes.    Has the patient been a risk to self in the past 6 months? Yes.    Has the patient been a risk to self within the distant past? Yes.    Is the patient a risk to others? No.  Has the patient been a risk to others in the past 6 months? No.  Has the patient been a risk to others within the distant past? No.   [place] Scale:  Flowsheet Row [facility] ([facility]) [facility] from 09/27/2023 in [facility] ED from 04/06/2022 in [facility]   C-SSRS RISK CATEGORY No Risk No Risk No Risk        Prior Inpatient Therapy: Yes.   If yes, describe [facility] 2017, 3 prior hospitalizations also documented. Prior Outpatient Therapy: Yes.   If yes, describe [facility] team  Alcohol Screening: Patient refused Alcohol Screening Tool:  (No) 1. How often do you have a drink containing alcohol?: Never 2. How many drinks containing alcohol do you have on a typical day when you are drinking?: 1 or 2 3. How often do you have six or more drinks on one occasion?: Never AUDIT-C Score: 0 Alcohol Brief Interventions/Follow-up: Alcohol education/Brief advice Substance Abuse History in the last 12 months:  Yes.   Consequences of Substance Abuse: Medical Consequences:  Possible hallucinations Previous Psychotropic Medications: Yes  Psychological Evaluations: Yes  Past Medical History:  Past Medical History:  Diagnosis Date   Bipolar 1 disorder (HCC)    Seasonal allergies     Past Surgical History:  Procedure Laterality Date   NO PAST SURGERIES     Family History:  Family History  Problem Relation Age of Onset   Diabetes Father    Family Psychiatric  History: Unknown at this time Tobacco Screening:  Social History   Tobacco Use  Smoking Status Never  Smokeless Tobacco Never     BH Tobacco Counseling     Are you interested in Tobacco Cessation Medications?  No value filed. Counseled patient on smoking cessation:  No value filed. Reason Tobacco Screening Not Completed: No value filed.       Social History:  Social History   Substance and Sexual Activity  Alcohol Use Yes   Comment: sometimes     Social History   Substance and Sexual Activity  Drug Use Not Currently    Additional Social History: Marital status: Single Are you sexually active?: No What is your sexual orientation?: heterosexua Has your sexual activity been affected by drugs, alcohol, medication, or emotional stress?: denies Does patient have children?: No                         Allergies:  Not on File Lab Results:  Results for orders placed or performed during the hospital encounter of 09/27/23 (from the past 48 hour(s))  Comprehensive metabolic panel  Status: Abnormal   Collection Time: 09/27/23  7:58 AM  Result Value Ref Range   Sodium 140 135 - 145 mmol/L   Potassium 3.7 3.5 - 5.1 mmol/L   Chloride 104 98 - 111 mmol/L   CO2 24 22 - 32 mmol/L   Glucose, Bld 96 70 - 99 mg/dL    Comment: Glucose reference range applies only to samples taken after fasting for at least 8 hours.   BUN 17 6 - 20 mg/dL   Creatinine, Ser 1.19 0.61 - 1.24 mg/dL   Calcium 9.3 8.9 - 14.7 mg/dL   Total Protein 7.8 6.5 - 8.1 g/dL   Albumin 5.2 (H) 3.5 - 5.0 g/dL   AST 18 15 - 41 U/L   ALT 18 0 - 44 U/L   Alkaline Phosphatase 81 38 - 126 U/L   Total Bilirubin 0.9 <1.2 mg/dL   GFR, Estimated >82 >95 mL/min    Comment: (NOTE) Calculated using the CKD-EPI Creatinine Equation (2021)    Anion gap 12 5 - 15    Comment: Performed at [facility], [address] [place], [place] [address]  Ethanol     Status: None   Collection Time: 09/27/23  7:58 AM  Result Value Ref Range   Alcohol, Ethyl (B) <10 <10 mg/dL    Comment: (NOTE) Lowest detectable limit for serum alcohol is 10  mg/dL.  For medical purposes only. Performed at [facility], [address] [place], [place] [address]   Salicylate level     Status: Abnormal   Collection Time: 09/27/23  7:58 AM  Result Value Ref Range   Salicylate Lvl <7.0 (L) 7.0 - 30.0 mg/dL    Comment: Performed at [facility], [address] [place], [place] [address]  Acetaminophen level     Status: Abnormal   Collection Time: 09/27/23  7:58 AM  Result Value Ref Range   Acetaminophen (Tylenol), Serum <10 (L) 10 - 30 ug/mL    Comment: (NOTE) Therapeutic concentrations vary significantly. A range of 10-30 ug/mL  may be an effective concentration for many patients. However, some  are best treated at concentrations outside of this range. Acetaminophen concentrations >150 ug/mL at 4 hours after ingestion  and >50 ug/mL at 12 hours after ingestion are often associated with  toxic reactions.  Performed at [facility], [address] [place], [place] [address]   cbc     Status: None   Collection Time: 09/27/23  7:58 AM  Result Value Ref Range   WBC 5.1 4.0 - 10.5 K/uL   RBC 5.06 4.22 - 5.81 MIL/uL   Hemoglobin 15.0 13.0 - 17.0 g/dL   HCT 13.2 44.0 - 10.2 %   MCV 85.6 80.0 - 100.0 fL   MCH 29.6 26.0 - 34.0 pg   MCHC 34.6 30.0 - 36.0 g/dL   RDW 72.5 36.6 - 44.0 %   Platelets 255 150 - 400 K/uL   nRBC 0.0 0.0 - 0.2 %    Comment: Performed at [facility], [address], [place], [place] [address]  Urine Drug Screen, Qualitative     Status: None   Collection Time: 09/27/23  3:47 PM  Result Value Ref Range   Tricyclic, Ur Screen NONE DETECTED NONE DETECTED   Amphetamines, Ur Screen NONE DETECTED NONE DETECTED   MDMA (Ecstasy)Ur Screen NONE DETECTED NONE DETECTED   Cocaine Metabolite,Ur [place] NONE DETECTED NONE DETECTED   Opiate, Ur Screen NONE DETECTED NONE DETECTED   Phencyclidine (  PCP) Ur S NONE DETECTED NONE DETECTED   Cannabinoid 50 Ng, Ur [place] NONE DETECTED NONE DETECTED    Barbiturates, Ur Screen NONE DETECTED NONE DETECTED   Benzodiazepine, Ur Scrn NONE DETECTED NONE DETECTED   Methadone Scn, Ur NONE DETECTED NONE DETECTED    Comment: (NOTE) Tricyclics + metabolites, urine    Cutoff 1000 ng/mL Amphetamines + metabolites, urine  Cutoff 1000 ng/mL MDMA (Ecstasy), urine              Cutoff 500 ng/mL Cocaine Metabolite, urine          Cutoff 300 ng/mL Opiate + metabolites, urine        Cutoff 300 ng/mL Phencyclidine (PCP), urine         Cutoff 25 ng/mL Cannabinoid, urine                 Cutoff 50 ng/mL Barbiturates + metabolites, urine  Cutoff 200 ng/mL Benzodiazepine, urine              Cutoff 200 ng/mL Methadone, urine                   Cutoff 300 ng/mL  The urine drug screen provides only a preliminary, unconfirmed analytical test result and should not be used for non-medical purposes. Clinical consideration and professional judgment should be applied to any positive drug screen result due to possible interfering substances. A more specific alternate chemical method must be used in order to obtain a confirmed analytical result. Gas chromatography / mass spectrometry (GC/MS) is the preferred confirm atory method. Performed at [facility], [address] [place], [place] [address]     Blood Alcohol level:  Lab Results  Component Value Date   [facility] <10 09/27/2023   ETH <5 09/02/2016    Metabolic Disorder Labs:  Lab Results  Component Value Date   HGBA1C 5.3 08/17/2016   MPG 105 08/17/2016   No results found for: "PROLACTIN" Lab Results  Component Value Date   CHOL 160 07/07/2020   TRIG 74 07/07/2020   HDL 27 (L) 07/07/2020   CHOLHDL 3.7 08/17/2016   VLDL 11 08/17/2016   LDLCALC 119 (H) 07/07/2020   LDLCALC 118 (H) 06/25/2018    Current Medications: Current Facility-Administered Medications  Medication Dose Route Frequency Provider Last Rate Last Admin   acetaminophen (TYLENOL) tablet 650 mg  650 mg Oral Q6H PRN Dahlia Byes C, NP       alum & mag hydroxide-simeth (MAALOX/MYLANTA) 200-200-20 MG/5ML suspension 30 mL  30 mL Oral Q4H PRN Dahlia Byes C, NP       hydrOXYzine (ATARAX) tablet 25 mg  25 mg Oral TID PRN Dahlia Byes C, NP       magnesium hydroxide (MILK OF MAGNESIA) suspension 30 mL  30 mL Oral Daily PRN Welford Roche, Josephine C, NP       OLANZapine (ZYPREXA) injection 10 mg  10 mg Intramuscular TID PRN Dahlia Byes C, NP       OLANZapine (ZYPREXA) injection 5 mg  5 mg Intramuscular TID PRN Dahlia Byes C, NP       OLANZapine zydis (ZYPREXA) disintegrating tablet 5 mg  5 mg Oral TID PRN Dahlia Byes C, NP       paliperidone (INVEGA) 24 hr tablet 3 mg  3 mg Oral Daily [place], Josephine C, NP       traZODone (DESYREL) tablet 50 mg  50 mg Oral QHS PRN Earney Navy, NP  PTA Medications: No medications prior to admission.    Musculoskeletal: Strength & Muscle Tone: within normal limits Gait & Station: normal Patient leans: N/A            Psychiatric Specialty Exam:  Presentation  General Appearance:  Casual; Disheveled  Eye Contact: Fair  Speech: Clear and Coherent  Speech Volume: Decreased  Handedness: Right   Mood and Affect  Mood: Anxious  Affect: Restricted   Thought Process  Thought Processes: Coherent  Duration of Psychotic Symptoms:N/A Past Diagnosis of Schizophrenia or Psychoactive disorder: Yes  Descriptions of Associations:Tangential  Orientation:Full (Time, Place and Person)  Thought Content:Perseveration; Rumination  Hallucinations:Hallucinations: Visual  Ideas of Reference:Paranoia  Suicidal Thoughts:Suicidal Thoughts: No  Homicidal Thoughts:Homicidal Thoughts: No   Sensorium  Memory: Immediate Fair; Recent Fair; Remote Fair  Judgment: Fair  Insight: Fair   Art therapist  Concentration: Fair  Attention Span: Fair  Recall: Fair  Fund of  Knowledge: Fair  Language: Good   Psychomotor Activity  Psychomotor Activity: Psychomotor Activity: Normal   Assets  Assets: Communication Skills; Desire for Improvement; Housing   Sleep  Sleep: Sleep: Fair    Physical Exam: Physical Exam Constitutional:      Appearance: Normal appearance. He is normal weight.  HENT:     Head: Normocephalic.  Neurological:     General: No focal deficit present.     Mental Status: He is alert and oriented to person, place, and time. Mental status is at baseline.  Psychiatric:        Mood and Affect: Mood normal.    Review of Systems  Psychiatric/Behavioral:  Positive for hallucinations. The patient is nervous/anxious.   All other systems reviewed and are negative.  Blood pressure 136/77, pulse 77, temperature 97.6 F (36.4 C), temperature source Oral, resp. rate 20, height 6\' 3"  (1.905 m), weight 74.4 kg, SpO2 100%. Body mass index is 20.5 kg/m.  Treatment Plan Summary: Daily contact with patient to assess and evaluate symptoms and progress in treatment and Medication management  Observation Level/Precautions:  15 minute checks  Laboratory:  CBC Chemistry Profile HbAIC HCG UDS  Psychotherapy: Individual and group.  Medications: Begin Invega as prescribed and titrate as tolerated.  Consider LAI. Reevaluate the need for Depakote.  Consultations:    Discharge Concerns: Safety planning and compliance with the ACT team  Estimated LOS: 5 to 7 days.  Other:     Physician Treatment Plan for Primary Diagnosis: Schizophrenia, acute undifferentiated (HCC) Long Term Goal(s): Improvement in symptoms so as ready for discharge  Short Term Goals: Ability to identify changes in lifestyle to reduce recurrence of condition will improve, Ability to disclose and discuss suicidal ideas, Ability to demonstrate self-control will improve, and Compliance with prescribed medications will improve  Physician Treatment Plan for Secondary Diagnosis:  Principal Problem:   Schizophrenia, acute undifferentiated (HCC)  Long Term Goal(s): Improvement in symptoms so as ready for discharge  Short Term Goals: Ability to demonstrate self-control will improve, Ability to identify and develop effective coping behaviors will improve, Ability to maintain clinical measurements within normal limits will improve, and Compliance with prescribed medications will improve  I certify that inpatient services furnished can reasonably be expected to improve the patient's condition.    Rex Kras, MD 11/24/202412:24 PM

## 2023-09-28 NOTE — Progress Notes (Signed)
Patient ID: Leonard Davidson, male   DOB: April 05, 1996, 27 y.o.   MRN: [id number]    Pt alert and oriented during admission process. Pt denies SI/HI, AVH at present time. Pt denies pain. Pt is anxious but cooperative. Education, support, reassurance, and encouragement provided, q15 minute safety checks initiated. Pt's belongings in locker #10.  Pt denies any concerns at this time and verbally contracts for safety. Pt ambulating on the unit with no issues. Pt remains safe on and off the unit.

## 2023-09-28 NOTE — Progress Notes (Signed)
   09/28/23 2000  Psych Admission Type (Psych Patients Only)  Admission Status Involuntary  Psychosocial Assessment  Patient Complaints Anxiety  Eye Contact Fair  Facial Expression Anxious  Affect Anxious;Preoccupied  Speech Rapid;Pressured  Interaction Needy  Motor Activity Restless  Appearance/Hygiene Unremarkable  Behavior Characteristics Cooperative  Mood Preoccupied  Thought Process  Coherency WDL  Content Religiosity  Delusions None reported or observed  Perception WDL  Hallucination None reported or observed  Judgment Limited  Confusion None  Danger to Self  Current suicidal ideation? Denies  Agreement Not to Harm Self Yes  Description of Agreement Verbal  Danger to Others  Danger to Others None reported or observed

## 2023-09-29 ENCOUNTER — Encounter (HOSPITAL_COMMUNITY): Payer: Self-pay

## 2023-09-29 DIAGNOSIS — F203 Undifferentiated schizophrenia: Secondary | ICD-10-CM | POA: Diagnosis not present

## 2023-09-29 NOTE — Progress Notes (Signed)
   09/29/23 0800  Psych Admission Type (Psych Patients Only)  Admission Status Involuntary  Psychosocial Assessment  Patient Complaints Anxiety  Eye Contact Fair  Facial Expression Anxious  Affect Anxious  Speech Pressured  Interaction Minimal  Motor Activity Restless  Appearance/Hygiene Unremarkable  Behavior Characteristics Cooperative  Mood Preoccupied  Thought Process  Coherency WDL  Content Obsessions  Delusions None reported or observed  Perception WDL  Hallucination None reported or observed  Judgment Limited  Confusion None  Danger to Self  Current suicidal ideation? Denies  Agreement Not to Harm Self Yes  Description of Agreement verbal  Danger to Others  Danger to Others None reported or observed   Patient agreed to take medication

## 2023-09-29 NOTE — Progress Notes (Signed)
Patient initially refused morning medication Zyprexa. He later requested the medication. Patient remains preoccupied with toilet paper wipes, staff looked in his locker, however none were there.

## 2023-09-29 NOTE — Plan of Care (Signed)
Problem: Education: Goal: Emotional status will improve Outcome: Progressing Goal: Mental status will improve Outcome: Not Progressing

## 2023-09-29 NOTE — Progress Notes (Signed)
[facility] MD Progress Note  09/29/2023 12:29 PM Leonard Davidson  MRN:  [id number]  Principal Problem: Schizophrenia, acute undifferentiated (HCC) Diagnosis: Principal Problem:   Schizophrenia, acute undifferentiated (HCC)  Reason for admission:   The patient is a 27 year old male who reports that he was having a breakdown.  He was seen on telepsychiatry and at that time he was calm and cooperative and denied all symptoms but admitted to having a nervous breakdown.  He reports that he was under a lot of stress.  And according to the father he was having active hallucinations and saw a cat currently human being.  He was quite overwhelmed.  Urine tox screen was negative for substances of abuse.   Yesterday the psychiatry team made the following recommendations:  Today's assessment note: On assessment today, the pt reports that his mood is less depressed, and improving.  He is alert, pleasant, and oriented to person, place, time, and situation.  Speech is clear and coherent, however patient perseverates and fixated on why the father brought him to the hospital.  Patient has poor insight, reporting that there is nothing wrong with him that the father just brought him to the hospital and left him here.  When asked about relationship between his dad and himself, responded, "we are at the same human, however different beast. "  No as needed medication for agitation required at this time, and mood/affect without agitation.  We will not resume Depakote at this time, and we will continue to observe patient for changes in mood.  Per nursing staff report, patient not taking his medication.  Psychoeducation provided and the need to take his medication to prevent further relapse.  Patient responded, "I will think about it."  Denies any acute discomfort.  Patient appears to be minimizing his symptoms as he reports his depressive symptoms is 0/10 and anxiety symptoms is 0/10, with 10 being high severity.  He further  denies SI, HI, or AVH. Reports that anxiety is at manageable level. Nursing staff report patient sleeping over 4.25 last night, encouraged patient to ask for trazodone for sleep at night, he responded, "I do sleep at night (normal routine."   Appetite is good Concentration is improving Energy level is adequate Denies suicidal thoughts.  Denies suicidal intent or plan.  Denies having any HI.  Denies having psychotic symptoms.   Denies having side effects to current psychiatric medications.   Encouragement and psychoeducation provided to comply with current medication regimen.  He responded that he will try.  Discussed the following psychosocial stressors: Encouraged to attend therapeutic milieu and unit group activities, and this has proven to improve patient's mood.  Total Time spent with patient: 45 minutes  Past Psychiatric History:  Patient gives a positive history of cannabinoid abuse and bipolar disorder.  He had 1 prior hospitalization in 2017.  He was connected with [facility] and had an ACT team and was on Invega only LAI but stopped everything several months ago.  He is currently not under anyone's care at this time.  Review of the [facility] records indicate that that was the third admission for this patient.  Apparently he was on Invega and Depakote at the time of discharge in 2017.  Somehow that was changed to Abilify as an outpatient which she has discontinued.   Past Medical History:  Past Medical History:  Diagnosis Date   Bipolar 1 disorder (HCC)    Seasonal allergies     Past Surgical History:  Procedure  Laterality Date   NO PAST SURGERIES     Family History:  Family History  Problem Relation Age of Onset   Diabetes Father    Family Psychiatric  History: See H&P Social History:  Social History   Substance and Sexual Activity  Alcohol Use Yes   Comment: sometimes     Social History   Substance and Sexual Activity  Drug Use Not Currently     Social History   Socioeconomic History   Marital status: Single    Spouse name: Not on file   Number of children: Not on file   Years of education: Not on file   Highest education level: Not on file  Occupational History   Not on file  Tobacco Use   Smoking status: Never   Smokeless tobacco: Never  Vaping Use   Vaping status: Never Used  Substance and Sexual Activity   Alcohol use: Yes    Comment: sometimes   Drug use: Not Currently   Sexual activity: Not on file  Other Topics Concern   Not on file  Social History Narrative   Not on file   Social Determinants of Health   Financial Resource Strain: Not on file  Food Insecurity: No Food Insecurity (09/28/2023)   Hunger Vital Sign    Worried About Running Out of Food in the Last Year: Never true    Ran Out of Food in the Last Year: Never true  Transportation Needs: No Transportation Needs (09/28/2023)   PRAPARE - Administrator, Civil Service (Medical): No    Lack of Transportation (Non-Medical): No  Physical Activity: Not on file  Stress: Not on file  Social Connections: Not on file   Additional Social History:    Sleep: Fair  Appetite:  Good  Current Medications: Current Facility-Administered Medications  Medication Dose Route Frequency Provider Last Rate Last Admin   acetaminophen (TYLENOL) tablet 650 mg  650 mg Oral Q6H PRN Welford Roche, Josephine C, NP       alum & mag hydroxide-simeth (MAALOX/MYLANTA) 200-200-20 MG/5ML suspension 30 mL  30 mL Oral Q4H PRN Welford Roche, Josephine C, NP       hydrOXYzine (ATARAX) tablet 25 mg  25 mg Oral TID PRN Dahlia Byes C, NP       magnesium hydroxide (MILK OF MAGNESIA) suspension 30 mL  30 mL Oral Daily PRN Welford Roche, Josephine C, NP       OLANZapine (ZYPREXA) injection 10 mg  10 mg Intramuscular TID PRN Dahlia Byes C, NP       OLANZapine (ZYPREXA) injection 5 mg  5 mg Intramuscular TID PRN Dahlia Byes C, NP       OLANZapine zydis (ZYPREXA)  disintegrating tablet 5 mg  5 mg Oral TID PRN Dahlia Byes C, NP       paliperidone (INVEGA) 24 hr tablet 3 mg  3 mg Oral Daily Onuoha, Josephine C, NP   3 mg at 09/28/23 1309   traZODone (DESYREL) tablet 50 mg  50 mg Oral QHS PRN Earney Navy, NP       Lab Results:  Results for orders placed or performed during the hospital encounter of 09/27/23 (from the past 48 hour(s))  Urine Drug Screen, Qualitative     Status: None   Collection Time: 09/27/23  3:47 PM  Result Value Ref Range   Tricyclic, Ur Screen NONE DETECTED NONE DETECTED   Amphetamines, Ur Screen NONE DETECTED NONE DETECTED   MDMA (Ecstasy)Ur Screen NONE DETECTED NONE DETECTED  Cocaine Metabolite,Ur [place] NONE DETECTED NONE DETECTED   Opiate, Ur Screen NONE DETECTED NONE DETECTED   Phencyclidine (PCP) Ur S NONE DETECTED NONE DETECTED   Cannabinoid 50 Ng, Ur [place] NONE DETECTED NONE DETECTED   Barbiturates, Ur Screen NONE DETECTED NONE DETECTED   Benzodiazepine, Ur Scrn NONE DETECTED NONE DETECTED   Methadone Scn, Ur NONE DETECTED NONE DETECTED    Comment: (NOTE) Tricyclics + metabolites, urine    Cutoff 1000 ng/mL Amphetamines + metabolites, urine  Cutoff 1000 ng/mL MDMA (Ecstasy), urine              Cutoff 500 ng/mL Cocaine Metabolite, urine          Cutoff 300 ng/mL Opiate + metabolites, urine        Cutoff 300 ng/mL Phencyclidine (PCP), urine         Cutoff 25 ng/mL Cannabinoid, urine                 Cutoff 50 ng/mL Barbiturates + metabolites, urine  Cutoff 200 ng/mL Benzodiazepine, urine              Cutoff 200 ng/mL Methadone, urine                   Cutoff 300 ng/mL  The urine drug screen provides only a preliminary, unconfirmed analytical test result and should not be used for non-medical purposes. Clinical consideration and professional judgment should be applied to any positive drug screen result due to possible interfering substances. A more specific alternate chemical method must be used in order to  obtain a confirmed analytical result. Gas chromatography / mass spectrometry (GC/MS) is the preferred confirm atory method. Performed at [facility], [address] [place], [place] [address]    Blood Alcohol level:  Lab Results  Component Value Date   [facility] <10 09/27/2023   ETH <5 09/02/2016   Metabolic Disorder Labs: Lab Results  Component Value Date   HGBA1C 5.3 08/17/2016   MPG 105 08/17/2016   No results found for: "PROLACTIN" Lab Results  Component Value Date   CHOL 160 07/07/2020   TRIG 74 07/07/2020   HDL 27 (L) 07/07/2020   CHOLHDL 3.7 08/17/2016   VLDL 11 08/17/2016   LDLCALC 119 (H) 07/07/2020   LDLCALC 118 (H) 06/25/2018   Physical Findings: AIMS:  , ,  ,  ,    CIWA:    COWS:     Musculoskeletal: Strength & Muscle Tone: within normal limits Gait & Station: normal Patient leans: N/A  Psychiatric Specialty Exam:  Presentation  General Appearance:  Casual  Eye Contact: Fair  Speech: Clear and Coherent  Speech Volume: Normal  Handedness: Right  Mood and Affect  Mood: Anxious  Affect: Restricted  Thought Process  Thought Processes: Coherent  Descriptions of Associations:Intact  Orientation:Full (Time, Place and Person)  Thought Content:Perseveration; Paranoid Ideation  History of Schizophrenia/Schizoaffective disorder:Yes  Duration of Psychotic Symptoms:Greater than six months  Hallucinations:Hallucinations: None  Ideas of Reference:Paranoia  Suicidal Thoughts:Suicidal Thoughts: No  Homicidal Thoughts:Homicidal Thoughts: No  Sensorium  Memory: Immediate Fair; Recent Fair  Judgment: Intact  Insight: Fair  Chartered certified accountant: Fair  Attention Span: Fair  Recall: [employer] of Knowledge: Fair  Language: Fair  Psychomotor Activity  Psychomotor Activity: Psychomotor Activity: Normal  Assets  Assets: Communication Skills; Desire for Improvement; Physical Health; Housing;  Resilience  Sleep  Sleep: Sleep: Fair Number of Hours of Sleep: 4.5  Physical Exam: Physical Exam Vitals  and nursing note reviewed.  Constitutional:      Appearance: Normal appearance.  HENT:     Head: Normocephalic.     Nose: Nose normal.     Mouth/Throat:     Mouth: Mucous membranes are moist.  Eyes:     Extraocular Movements: Extraocular movements intact.  Cardiovascular:     Rate and Rhythm: Normal rate.     Pulses: Normal pulses.  Pulmonary:     Effort: Pulmonary effort is normal.  Abdominal:     Comments: Deferred  Genitourinary:    Comments: Deferred Musculoskeletal:        General: Normal range of motion.     Cervical back: Normal range of motion.  Skin:    General: Skin is warm.  Neurological:     General: No focal deficit present.     Mental Status: He is alert and oriented to person, place, and time.  Psychiatric:        Mood and Affect: Mood normal.        Behavior: Behavior normal.        Thought Content: Thought content normal.    Review of Systems  Constitutional:  Negative for chills and fever.  HENT:  Negative for sore throat.   Eyes:  Negative for blurred vision.  Respiratory:  Negative for cough, sputum production, shortness of breath and wheezing.   Cardiovascular:  Negative for chest pain and palpitations.  Gastrointestinal:  Negative for abdominal pain, diarrhea, heartburn, nausea and vomiting.  Genitourinary:  Negative for dysuria, hematuria and urgency.  Musculoskeletal: Negative.   Skin:  Negative for itching and rash.  Neurological:  Negative for dizziness, tingling, tremors, sensory change, speech change and headaches.  Endo/Heme/Allergies:        See allergy listing  Psychiatric/Behavioral:  Positive for depression. The patient is nervous/anxious and has insomnia.    Blood pressure 134/83, pulse 72, temperature 98.1 F (36.7 C), temperature source Oral, resp. rate 18, height 6\' 3"  (1.905 m), weight 74.4 kg, SpO2 100%. Body mass  index is 20.5 kg/m.  Treatment Plan Summary: Daily contact with patient to assess and evaluate symptoms and progress in treatment and Medication management Treatment Plan Summary: Daily contact with patient to assess and evaluate symptoms and progress in treatment and Medication management   Observation Level/Precautions:  15 minute checks  Laboratory:  CBC Chemistry Profile HbAIC HCG UDS  Psychotherapy: Individual and group.  Medications: Begin Invega as prescribed and titrate as tolerated.  Consider LAI. Reevaluate the need for Depakote.  Consultations:    Discharge Concerns: Safety planning and compliance with the ACT team  Estimated LOS: 5 to 7 days.  Other:      Physician Treatment Plan for Primary Diagnosis: Schizophrenia, acute undifferentiated (HCC) Long Term Goal(s): Improvement in symptoms so as ready for discharge   Short Term Goals: Ability to identify changes in lifestyle to reduce recurrence of condition will improve, Ability to disclose and discuss suicidal ideas, Ability to demonstrate self-control will improve, and Compliance with prescribed medications will improve   Physician Treatment Plan for Secondary Diagnosis: Principal Problem:   Schizophrenia, acute undifferentiated (HCC)   Long Term Goal(s): Improvement in symptoms so as ready for discharge   Short Term Goals: Ability to demonstrate self-control will improve, Ability to identify and develop effective coping behaviors will improve, Ability to maintain clinical measurements within normal limits will improve, and Compliance with prescribed medications will improve  Cecilie Lowers, FNP 09/29/2023, 12:29 PM

## 2023-09-29 NOTE — [facility] Group Notes (Signed)

## 2023-09-29 NOTE — Plan of Care (Signed)
  Problem: Education: Goal: Emotional status will improve Outcome: Progressing Goal: Verbalization of understanding the information provided will improve Outcome: Progressing   Problem: Education: Goal: Mental status will improve Outcome: Not Progressing   Problem: Activity: Goal: Interest or engagement in activities will improve Outcome: Not Progressing Goal: Sleeping patterns will improve Outcome: Not Progressing

## 2023-09-29 NOTE — Group Note (Signed)
Recreation Therapy Group Note   Group Topic:Health and Wellness  Group Date: 09/29/2023 Start Time: 0930 End Time: 1005 Facilitators: Tionne Carelli-McCall, LRT,CTRS Location: [address] Dayroom   Group Topic: Exercise/Wellness  Goal Area(s) Addresses:  Patient will verbalize benefit of exercise during group session. Patient will identify an exercise that can be completed post d/c. Patient will acknowledge benefits of exercise when used as a coping mechanism.   Group Description: Patients and LRT discussed the importance of physical exercise and its benefits. During group, patients took turns leading the group in the exercises/stretches of their choosing. Patients completed three rounds of exercise. Patients could get water or take a break if needed.  Education: Physical Activity, Health and Wellness  Education Outcome: Acknowledges understanding/In group clarification offered/Needs additional education.    Affect/Mood: N/A   Participation Level: Did not attend    Clinical Observations/Individualized Feedback:     Plan: Continue to engage patient in RT group sessions 2-3x/week.   Hersh Minney-McCall, LRT,CTRS 09/29/2023 11:16 AM

## 2023-09-29 NOTE — [facility] Group Notes (Signed)
[facility] Group Notes:  (Nursing/MHT/Case Management/Adjunct)  Date:  09/29/2023  Time:  2000  Type of Therapy:   AA Group  Participation Level:  Did Not Attend  Participation Quality:   N/A  Affect:  N/A  Cognitive:   N/A  Insight:  None  Engagement in Group:   N/A  Modes of Intervention:   N/A  Summary of Progress/Problems: Pt. Were aware of group. Pt. Refused.  Fay Records 09/29/2023, 11:11 PM

## 2023-09-29 NOTE — BH IP Treatment Plan (Signed)
Interdisciplinary Treatment and Diagnostic Plan Update  09/29/2023 Time of Session: 10:35AM  Leonard Davidson MRN: [id number]  Principal Diagnosis: Schizophrenia, acute undifferentiated (HCC)  Secondary Diagnoses: Principal Problem:   Schizophrenia, acute undifferentiated (HCC)   Current Medications:  Current Facility-Administered Medications  Medication Dose Route Frequency Provider Last Rate Last Admin   acetaminophen (TYLENOL) tablet 650 mg  650 mg Oral Q6H PRN Dahlia Byes C, NP       alum & mag hydroxide-simeth (MAALOX/MYLANTA) 200-200-20 MG/5ML suspension 30 mL  30 mL Oral Q4H PRN Dahlia Byes C, NP       hydrOXYzine (ATARAX) tablet 25 mg  25 mg Oral TID PRN Earney Navy, NP       magnesium hydroxide (MILK OF MAGNESIA) suspension 30 mL  30 mL Oral Daily PRN Dahlia Byes C, NP       OLANZapine (ZYPREXA) injection 10 mg  10 mg Intramuscular TID PRN Earney Navy, NP       OLANZapine (ZYPREXA) injection 5 mg  5 mg Intramuscular TID PRN Dahlia Byes C, NP       OLANZapine zydis (ZYPREXA) disintegrating tablet 5 mg  5 mg Oral TID PRN Dahlia Byes C, NP       paliperidone (INVEGA) 24 hr tablet 3 mg  3 mg Oral Daily Onuoha, Josephine C, NP   3 mg at 09/28/23 1309   traZODone (DESYREL) tablet 50 mg  50 mg Oral QHS PRN Dahlia Byes C, NP       PTA Medications: No medications prior to admission.    Patient Stressors: Medication change or noncompliance    Patient Strengths: Manufacturing systems engineer  Religious Affiliation  Supportive family/friends   Treatment Modalities: Medication Management, Group therapy, Case management,  1 to 1 session with clinician, Psychoeducation, Recreational therapy.   Physician Treatment Plan for Primary Diagnosis: Schizophrenia, acute undifferentiated (HCC) Long Term Goal(s): Improvement in symptoms so as ready for discharge   Short Term Goals: Ability to demonstrate self-control will improve Ability to  identify and develop effective coping behaviors will improve Ability to maintain clinical measurements within normal limits will improve Compliance with prescribed medications will improve Ability to identify changes in lifestyle to reduce recurrence of condition will improve Ability to disclose and discuss suicidal ideas  Medication Management: Evaluate patient's response, side effects, and tolerance of medication regimen.  Therapeutic Interventions: 1 to 1 sessions, Unit Group sessions and Medication administration.  Evaluation of Outcomes: Not Progressing  Physician Treatment Plan for Secondary Diagnosis: Principal Problem:   Schizophrenia, acute undifferentiated (HCC)  Long Term Goal(s): Improvement in symptoms so as ready for discharge   Short Term Goals: Ability to demonstrate self-control will improve Ability to identify and develop effective coping behaviors will improve Ability to maintain clinical measurements within normal limits will improve Compliance with prescribed medications will improve Ability to identify changes in lifestyle to reduce recurrence of condition will improve Ability to disclose and discuss suicidal ideas     Medication Management: Evaluate patient's response, side effects, and tolerance of medication regimen.  Therapeutic Interventions: 1 to 1 sessions, Unit Group sessions and Medication administration.  Evaluation of Outcomes: Not Progressing   RN Treatment Plan for Primary Diagnosis: Schizophrenia, acute undifferentiated (HCC) Long Term Goal(s): Knowledge of disease and therapeutic regimen to maintain health will improve  Short Term Goals: Ability to remain free from injury will improve, Ability to verbalize frustration and anger appropriately will improve, Ability to demonstrate self-control, Ability to participate in decision making will improve, Ability  to verbalize feelings will improve, Ability to disclose and discuss suicidal ideas, Ability  to identify and develop effective coping behaviors will improve, and Compliance with prescribed medications will improve  Medication Management: RN will administer medications as ordered by provider, will assess and evaluate patient's response and provide education to patient for prescribed medication. RN will report any adverse and/or side effects to prescribing provider.  Therapeutic Interventions: 1 on 1 counseling sessions, Psychoeducation, Medication administration, Evaluate responses to treatment, Monitor vital signs and CBGs as ordered, Perform/monitor CIWA, COWS, AIMS and Fall Risk screenings as ordered, Perform wound care treatments as ordered.  Evaluation of Outcomes: Not Progressing   LCSW Treatment Plan for Primary Diagnosis: Schizophrenia, acute undifferentiated (HCC) Long Term Goal(s): Safe transition to appropriate next level of care at discharge, Engage patient in therapeutic group addressing interpersonal concerns.  Short Term Goals: Engage patient in aftercare planning with referrals and resources, Increase social support, Increase ability to appropriately verbalize feelings, Increase emotional regulation, Facilitate acceptance of mental health diagnosis and concerns, Facilitate patient progression through stages of change regarding substance use diagnoses and concerns, Identify triggers associated with mental health/substance abuse issues, and Increase skills for wellness and recovery  Therapeutic Interventions: Assess for all discharge needs, 1 to 1 time with Social worker, Explore available resources and support systems, Assess for adequacy in community support network, Educate family and significant other(s) on suicide prevention, Complete Psychosocial Assessment, Interpersonal group therapy.  Evaluation of Outcomes: Not Progressing   Progress in Treatment: Attending groups: No. Participating in groups: No. Taking medication as prescribed: No. Toleration medication:  No. Family/Significant other contact made: No, will contact:  consents pending Patient understands diagnosis: No. Discussing patient identified problems/goals with staff: Yes. Medical problems stabilized or resolved: Yes. Denies suicidal/homicidal ideation: Yes. Issues/concerns per patient self-inventory: No. New problem(s) identified: No, Describe:  none reported  New Short Term/Long Term Goal(s): medication stabilization, elimination of SI thoughts, development of comprehensive mental wellness plan.    Patient Goals:  "Get discharged" Pt reports not wanting to contribute to meetings/groups until he gets his "things."  Discharge Plan or Barriers: Patient recently admitted. CSW will continue to follow and assess for appropriate referrals and possible discharge planning.    Reason for Continuation of Hospitalization: Delusions  Medication stabilization  Estimated Length of Stay: 5-7 days  Last 3 [place] Suicide Severity Risk Score: Flowsheet Row [facility] (Current) [facility] from 09/27/20[facility] [address] [facility] from [facility]/06/2022 in [facility]   C-SSRS RISK CATEGORY No Risk No Risk No Risk       Last PHQ 2/9 Scores:    05/15/2021   10:06 AM 10/17/2020    8:55 AM 06/22/2020   11:02 AM  Depression screen PHQ 2/9  Decreased Interest 0 0 0  Down, Depressed, Hopeless 0 0 0  PHQ - 2 Score 0 0 0    Scribe for Treatment Team: Kathi Der, Theresia Majors 09/29/2023 1:09 PM

## 2023-09-30 DIAGNOSIS — F203 Undifferentiated schizophrenia: Secondary | ICD-10-CM | POA: Diagnosis not present

## 2023-09-30 MED ORDER — PALIPERIDONE ER 6 MG PO TB24
6.0000 mg | ORAL_TABLET | Freq: Every day | ORAL | Status: DC
  Administered 2023-10-01 – 2023-10-03 (×3): 6 mg via ORAL
  Filled 2023-09-30 (×4): qty 1

## 2023-09-30 NOTE — Plan of Care (Signed)
  Problem: Activity: Goal: Interest or engagement in activities will improve Outcome: Progressing   Problem: Safety: Goal: Periods of time without injury will increase Outcome: Progressing   Problem: Coping: Goal: Will verbalize feelings Outcome: Progressing   Problem: Education: Goal: Emotional status will improve Outcome: Not Progressing Goal: Mental status will improve Outcome: Not Progressing   Problem: Activity: Goal: Sleeping patterns will improve Outcome: Not Progressing   Problem: Coping: Goal: Coping ability will improve Outcome: Not Progressing

## 2023-09-30 NOTE — Progress Notes (Signed)
   09/30/23 2030  Psych Admission Type (Psych Patients Only)  Admission Status Involuntary  Psychosocial Assessment  Patient Complaints Anxiety  Eye Contact Fair  Facial Expression Anxious  Affect Anxious  Speech Pressured;Argumentative  Interaction Attention-seeking  Motor Activity Restless  Appearance/Hygiene Unremarkable  Behavior Characteristics Cooperative  Mood Preoccupied  Aggressive Behavior  Effect No apparent injury  Thought Process  Coherency WDL  Content Preoccupation  Delusions None reported or observed  Perception WDL  Hallucination None reported or observed  Judgment Limited  Confusion None  Danger to Self  Current suicidal ideation? Denies

## 2023-09-30 NOTE — Progress Notes (Signed)
   09/30/23 1000  Psych Admission Type (Psych Patients Only)  Admission Status Involuntary  Psychosocial Assessment  Patient Complaints Anxiety  Eye Contact Fair  Facial Expression Anxious  Affect Anxious  Speech Pressured  Interaction Isolative  Motor Activity Restless  Appearance/Hygiene Unremarkable  Behavior Characteristics Cooperative  Mood Preoccupied  Thought Process  Coherency WDL  Content Obsessions  Delusions None reported or observed  Perception WDL  Hallucination None reported or observed  Judgment Limited  Confusion None  Danger to Self  Current suicidal ideation? Denies  Agreement Not to Harm Self Yes  Description of Agreement verbal contract  Danger to Others  Danger to Others None reported or observed

## 2023-09-30 NOTE — Progress Notes (Signed)
   09/29/23 2100  Psych Admission Type (Psych Patients Only)  Admission Status Involuntary  Psychosocial Assessment  Patient Complaints Anxiety  Eye Contact Fair  Facial Expression Anxious  Affect Anxious  Speech Pressured;Rapid  Interaction Minimal;Isolative  Motor Activity Restless  Appearance/Hygiene Unremarkable  Behavior Characteristics Cooperative  Mood Preoccupied  Thought Process  Coherency WDL  Content Obsessions  Delusions None reported or observed  Perception WDL  Hallucination None reported or observed  Judgment Limited  Confusion None  Danger to Self  Current suicidal ideation? Denies  Agreement Not to Harm Self Yes  Description of Agreement Verbal  Danger to Others  Danger to Others None reported or observed

## 2023-09-30 NOTE — Group Note (Signed)
[facility] LCSW Group Therapy Note   Group Date: 09/30/2023 Start Time: 1100 End Time: 1200  Type of Therapy:  Group Therapy  Participation Level:  Active  Participation Quality:  Appropriate  Affect:  Appropriate  Cognitive:  Appropriate  Insight:  Developing/Improving  Engagement in Therapy:  Developing/Improving  Modes of Intervention:  Activity, Discussion, Rapport Building, Socialization and Support  Summary of Progress/Problems: Patient actively participated in group on today. Group started off with introductions and group rules. Group members participated in a therapeutic activity that required active listening and communication skills. Group members were able to identify similarities and differences within the group. Patient interacted positively with staff and peers. Provided feedback regarding how he sees his current situation stating he plans to make better choices than before. Patient reports wanted to protect his temple and pay more attention to himself and how he reacts to certain things. Patient reports he does not want to do anything over, he just wants to make better choices moving forward. Patient provided feedback to peers No issues to report.   Loleta Dicker, LCSW

## 2023-09-30 NOTE — Progress Notes (Signed)
[facility] MD Progress Note  09/30/2023 1:23 PM Leonard Davidson  MRN:  [id number]  Principal Problem: Schizophrenia, acute undifferentiated (HCC) Diagnosis: Principal Problem:   Schizophrenia, acute undifferentiated (HCC)  Reason for admission:   The patient is a 27 year old male who reports that he was having a breakdown.  He was seen on telepsychiatry and at that time he was calm and cooperative and denied all symptoms but admitted to having a nervous breakdown.  He reports that he was under a lot of stress.  And according to the father he was having active hallucinations and saw a pleasantcat currently human being.  He was quite overwhelmed.  Urine tox screen was negative for substances of abuse.   Today's assessment note: Patient reports that his mood is less depressed, improving and requesting to be discharged so that he could go home and help his grandmother for the holidays.  He is alert, and oriented to person, place, time, and partly situation.  Observed to be more intrusive in behavior today.  Has been in the office 3 times this morning standing close to the provider and requesting to go home.  Patient also asking to talk to the medical student in his room or on the hallway, added, "I just want to ask her some few questions."  Made patient aware that I do not know any medical student on the unit, then stated, "I will find her and asked her those questions."  Speech is clear and coherent, however patient perseverates and fixated on being discharged from the hospital. Insight, and thought processes remains poor.  Nursing staff report patient being compliant with his medication regimen.  No as needed medication for agitation required at this time. affect.  Affect is congruent with mood.  We will not resume Depakote at this time, and we will continue to observe patient for changes in mood.  Psychoeducation to reiterated on the need to take his medication to prevent further relapse.  Denies any acute  discomfort.  Patient appears to be minimizing his symptoms as he reports his depressive symptoms is #0/10 and anxiety symptoms is #0/10, with 10 being high severity.  He further denies SI, HI, or AVH and added, "any question that is negative is 0." Reports that anxiety is at manageable level. Nursing staff report patient sleeping over 5.75 last night, encouraged patient to ask for trazodone for sleep at night, he responded, "I sleep well at night." Concentration is improving Energy level is adequate Denies suicidal thoughts.  Denies suicidal intent or plan.  Denies having any HI.  Denies having psychotic symptoms.   Denies having side effects to current psychiatric medications.   Encouragement and psychoeducation provided to comply with current medication regimen, and patient is in agreement.  Discussed the following psychosocial stressors: Encouraged to attend therapeutic milieu and unit group activities, and this has proven to improve patient's mood.  Total Time spent with patient: 35 Minutes      Past Psychiatric History:  Patient gives a positive history of cannabinoid abuse and bipolar disorder.  He had 1 prior hospitalization in 2017.  He was connected with [facility] and had an ACT team and was on Invega only LAI but stopped everything several months ago.  He is currently not under anyone's care at this time.  Review of the [facility] records indicate that that was the third admission for this patient.  Apparently he was on Invega and Depakote at the time of discharge in 2017.  Somehow that  was changed to Abilify as an outpatient which she has discontinued.   Past Medical History:  Past Medical History:  Diagnosis Date   Bipolar 1 disorder (HCC)    Seasonal allergies     Past Surgical History:  Procedure Laterality Date   NO PAST SURGERIES     Family History:  Family History  Problem Relation Age of Onset   Diabetes Father    Family Psychiatric  History: See  H&P Social History:  Social History   Substance and Sexual Activity  Alcohol Use Yes   Comment: sometimes     Social History   Substance and Sexual Activity  Drug Use Not Currently    Social History   Socioeconomic History   Marital status: Single    Spouse name: Not on file   Number of children: Not on file   Years of education: Not on file   Highest education level: Not on file  Occupational History   Not on file  Tobacco Use   Smoking status: Never   Smokeless tobacco: Never  Vaping Use   Vaping status: Never Used  Substance and Sexual Activity   Alcohol use: Yes    Comment: sometimes   Drug use: Not Currently   Sexual activity: Not on file  Other Topics Concern   Not on file  Social History Narrative   Not on file   Social Determinants of Health   Financial Resource Strain: Not on file  Food Insecurity: No Food Insecurity (09/28/2023)   Hunger Vital Sign    Worried About Running Out of Food in the Last Year: Never true    Ran Out of Food in the Last Year: Never true  Transportation Needs: No Transportation Needs (09/28/2023)   PRAPARE - Administrator, Civil Service (Medical): No    Lack of Transportation (Non-Medical): No  Physical Activity: Not on file  Stress: Not on file  Social Connections: Not on file   Additional Social History:    Sleep: Fair  Appetite:  Good  Current Medications: Current Facility-Administered Medications  Medication Dose Route Frequency Provider Last Rate Last Admin   acetaminophen (TYLENOL) tablet 650 mg  650 mg Oral Q6H PRN Welford Roche, Josephine C, NP       alum & mag hydroxide-simeth (MAALOX/MYLANTA) 200-200-20 MG/5ML suspension 30 mL  30 mL Oral Q4H PRN Welford Roche, Josephine C, NP       hydrOXYzine (ATARAX) tablet 25 mg  25 mg Oral TID PRN Dahlia Byes C, NP       magnesium hydroxide (MILK OF MAGNESIA) suspension 30 mL  30 mL Oral Daily PRN Welford Roche, Josephine C, NP       OLANZapine (ZYPREXA) injection 10 mg  10  mg Intramuscular TID PRN Dahlia Byes C, NP       OLANZapine (ZYPREXA) injection 5 mg  5 mg Intramuscular TID PRN Dahlia Byes C, NP       OLANZapine zydis (ZYPREXA) disintegrating tablet 5 mg  5 mg Oral TID PRN Dahlia Byes C, NP       paliperidone (INVEGA) 24 hr tablet 3 mg  3 mg Oral Daily Onuoha, Josephine C, NP   3 mg at 09/30/23 0803   traZODone (DESYREL) tablet 50 mg  50 mg Oral QHS PRN Earney Navy, NP       Lab Results:  No results found for this or any previous visit (from the past 48 hour(s)).  Blood Alcohol level:  Lab Results  Component Value Date  ETH <10 09/27/2023   ETH <5 09/02/2016   Metabolic Disorder Labs: Lab Results  Component Value Date   HGBA1C 5.3 08/17/2016   MPG 105 08/17/2016   No results found for: "PROLACTIN" Lab Results  Component Value Date   CHOL 160 07/07/2020   TRIG 74 07/07/2020   HDL 27 (L) 07/07/2020   CHOLHDL 3.7 08/17/2016   VLDL 11 08/17/2016   LDLCALC 119 (H) 07/07/2020   LDLCALC 118 (H) 06/25/2018   Physical Findings: AIMS:  , ,  ,  ,    CIWA:    COWS:     Musculoskeletal: Strength & Muscle Tone: within normal limits Gait & Station: normal Patient leans: N/A  Psychiatric Specialty Exam:  Presentation  General Appearance:  Appropriate for Environment; Casual  Eye Contact: Fair  Speech: Clear and Coherent  Speech Volume: Normal  Handedness: Right  Mood and Affect  Mood: Anxious (Intrusive)  Affect: Restricted  Thought Process  Thought Processes: Coherent; Linear  Descriptions of Associations:Intact  Orientation:Full (Time, Place and Person)  Thought Content:Perseveration; Rumination; WDL  History of Schizophrenia/Schizoaffective disorder:Yes  Duration of Psychotic Symptoms:Greater than six months  Hallucinations:Hallucinations: None Description of Visual Hallucinations: Denies  Ideas of Reference:Paranoia  Suicidal Thoughts:Suicidal Thoughts: No  Homicidal  Thoughts:Homicidal Thoughts: No  Sensorium  Memory: Immediate Fair; Recent Fair; Remote Fair  Judgment: Impaired  Insight: Shallow  Executive Functions  Concentration: Fair  Attention Span: Fair  Recall: [employer] of Knowledge: Fair  Language: Fair  Psychomotor Activity  Psychomotor Activity: Psychomotor Activity: Normal  Assets  Assets: Manufacturing systems engineer; Housing; Physical Health; Resilience; Social Support  Sleep  Sleep: Sleep: Fair Number of Hours of Sleep: 5.75  Physical Exam: Physical Exam Vitals and nursing note reviewed.  Constitutional:      Appearance: Normal appearance.  HENT:     Head: Normocephalic.     Nose: Nose normal.     Mouth/Throat:     Mouth: Mucous membranes are moist.  Eyes:     Extraocular Movements: Extraocular movements intact.  Cardiovascular:     Rate and Rhythm: Normal rate.     Pulses: Normal pulses.  Pulmonary:     Effort: Pulmonary effort is normal.  Abdominal:     Comments: Deferred  Genitourinary:    Comments: Deferred Musculoskeletal:        General: Normal range of motion.     Cervical back: Normal range of motion.  Skin:    General: Skin is warm.  Neurological:     General: No focal deficit present.     Mental Status: He is alert and oriented to person, place, and time.  Psychiatric:        Mood and Affect: Mood normal.        Behavior: Behavior normal.        Thought Content: Thought content normal.    Review of Systems  Constitutional:  Negative for chills and fever.  HENT:  Negative for sore throat.   Eyes:  Negative for blurred vision.  Respiratory:  Negative for cough, sputum production, shortness of breath and wheezing.   Cardiovascular:  Negative for chest pain and palpitations.  Gastrointestinal:  Negative for abdominal pain, diarrhea, heartburn, nausea and vomiting.  Genitourinary:  Negative for dysuria, hematuria and urgency.  Musculoskeletal: Negative.   Skin:  Negative for itching  and rash.  Neurological:  Negative for dizziness, tingling, tremors, sensory change, speech change and headaches.  Endo/Heme/Allergies:        See allergy listing  Psychiatric/Behavioral:  Negative for depression, hallucinations and suicidal ideas. The patient is nervous/anxious and has insomnia.    Blood pressure 122/76, pulse 88, temperature 98 F (36.7 C), temperature source Oral, resp. rate 18, height 6\' 3"  (1.905 m), weight 74.4 kg, SpO2 100%. Body mass index is 20.5 kg/m.  Treatment Plan Summary: Daily contact with patient to assess and evaluate symptoms and progress in treatment and Medication management   Observation Level/Precautions:  15 minute checks  Laboratory:  CBC Chemistry Profile HbAIC HCG UDS  Psychotherapy: Individual and group.  Medications: Begin Invega as prescribed and titrate as tolerated.  Consider LAI. Reevaluate the need for Depakote.  Consultations:    Discharge Concerns: Safety planning and compliance with the ACT team  Estimated LOS: 5 to 7 days.  Other:      Physician Treatment Plan for Primary Diagnosis: Schizophrenia, acute undifferentiated (HCC) Long Term Goal(s): Improvement in symptoms so as ready for discharge   Short Term Goals: Ability to identify changes in lifestyle to reduce recurrence of condition will improve, Ability to disclose and discuss suicidal ideas, Ability to demonstrate self-control will improve, and Compliance with prescribed medications will improve   Physician Treatment Plan for Secondary Diagnosis: Principal Problem:   Schizophrenia, acute undifferentiated (HCC)   Long Term Goal(s): Improvement in symptoms so as ready for discharge   Short Term Goals: Ability to demonstrate self-control will improve, Ability to identify and develop effective coping behaviors will improve, Ability to maintain clinical measurements within normal limits will improve, and Compliance with prescribed medications will improve  Cecilie Lowers,  FNP 09/30/2023, 1:23 PM Patient ID: Leonard Davidson, male   DOB: 1996-11-03, 27 y.o.   MRN: [id number]

## 2023-09-30 NOTE — Progress Notes (Signed)
Patient ID: Leonard Davidson, male   DOB: 1996-10-28, 27 y.o.   MRN: [id number]  Increase Invega from from 3 mg p.o. daily to 6 mg p.o. daily starting 10/01/2023 psychosis and intrusive behavior.  Alan Mulder, FNP, Psychiatry.

## 2023-09-30 NOTE — Plan of Care (Signed)
Problem: Safety: Goal: Periods of time without injury will increase Outcome: Progressing   Problem: Coping: Goal: Coping ability will improve Outcome: Progressing

## 2023-10-01 DIAGNOSIS — F203 Undifferentiated schizophrenia: Secondary | ICD-10-CM | POA: Diagnosis not present

## 2023-10-01 NOTE — Progress Notes (Signed)
   10/01/23 0800  Psych Admission Type (Psych Patients Only)  Admission Status Involuntary  Psychosocial Assessment  Patient Complaints None  Eye Contact Fair  Facial Expression Anxious  Affect Anxious  Speech Pressured;Rapid  Interaction Assertive  Motor Activity Restless  Appearance/Hygiene Unremarkable  Behavior Characteristics Cooperative  Mood Preoccupied  Thought Process  Coherency WDL  Content Preoccupation  Delusions None reported or observed  Perception WDL  Hallucination None reported or observed  Judgment Limited  Confusion None  Danger to Self  Current suicidal ideation? Denies  Agreement Not to Harm Self Yes  Description of Agreement Verbal  Danger to Others  Danger to Others None reported or observed

## 2023-10-01 NOTE — [facility] Group Notes (Signed)
Adult Psychoeducational Group Note  Date:  10/01/2023 Time:  10:26 PM  Group Topic/Focus:  Wrap-Up Group:   The focus of this group is to help patients review their daily goal of treatment and discuss progress on daily workbooks.  Participation Level:  Active  Participation Quality:  Appropriate  Affect:  Appropriate  Cognitive:  Appropriate  Insight: Appropriate  Engagement in Group:  Engaged  Modes of Intervention:  Discussion and Support  Additional Comments:  Pt attended and participated in NA group.  Yoshiye Kraft Katrinka Blazing 10/01/2023, 10:26 PM

## 2023-10-01 NOTE — Progress Notes (Signed)
Writer spent time talking to pt earlier in the evening, pt appeared to listen, pt educated on things moving forward after D/C . Pt initially refused HS medication, pt did take PRN Trazodone .

## 2023-10-01 NOTE — Progress Notes (Signed)
[facility] MD Progress Note  10/01/2023 11:01 AM RONTE GRAVELINE  MRN:  [id number]  Principal Problem: Schizophrenia, acute undifferentiated (HCC) Diagnosis: Principal Problem:   Schizophrenia, acute undifferentiated (HCC)  Reason for admission:   The patient is a 27 year old male who reports that he was having a breakdown.  He was seen on telepsychiatry and at that time he was calm and cooperative and denied all symptoms but admitted to having a nervous breakdown.  He reports that he was under a lot of stress.  And according to the father he was having active hallucinations and saw a pleasantcat currently human being.  He was quite overwhelmed.  Urine tox screen was negative for substances of abuse.   Today's assessment note: Patient seen and examined in the office sitting up in a chair.  He is alert, oriented to person, place, time, and situation.  He remained very intrusive and knocking at the office door about 5 times this morning and asking for different things.  He appears coherent however, frequently asking to be discharged to home.  He reports his mood is not depressed but anxious due to wanting to go home.  Speech is clear and coherent with rumination and perseveration.  He reports he was served by the sheriff to appear in court on October 07, 2023.  Instruct the patient to follow up with his Child psychotherapist as they know the procedure with the [employer] or [employer].  Patient continues to minimize his symptoms of depression, anxiety and psychosis.  He is compliant with his medications of Invega 6 mg p.o. which was increased yesterday.  We will continue to monitor the effectiveness of dosage adjustment for patient as he continues to require inpatient psychiatric admission at this time.  Reports that anxiety is at manageable level. Nursing staff report patient sleeping over 6.25 hours last night, with the help of trazodone for sleep. Energy level is adequate Denies suicidal thoughts.  Denies  suicidal intent or plan.  Denies having any HI.  Denies having psychotic symptoms, however behavior very intrusive and lacking boundaries  Denies having side effects to current psychiatric medications.   Encouragement and psychoeducation provided to comply with current medication regimen, and patient is in agreement.  Discussed the following psychosocial stressors: Encouraged to attend therapeutic milieu and unit group activities, and this has proven to improve patient's mood.  Per social worker's report, patient attended and participating effectively in therapeutic milieu.  Total Time spent with patient: 35 Minutes      Past Psychiatric History:  Patient gives a positive history of cannabinoid abuse and bipolar disorder.  He had 1 prior hospitalization in 2017.  He was connected with [facility] and had an ACT team and was on Invega only LAI but stopped everything several months ago.  He is currently not under anyone's care at this time.  Review of the [facility] records indicate that that was the third admission for this patient.  Apparently he was on Invega and Depakote at the time of discharge in 2017.  Somehow that was changed to Abilify as an outpatient which she has discontinued.   Past Medical History:  Past Medical History:  Diagnosis Date   Bipolar 1 disorder (HCC)    Seasonal allergies     Past Surgical History:  Procedure Laterality Date   NO PAST SURGERIES     Family History:  Family History  Problem Relation Age of Onset   Diabetes Father    Family Psychiatric  History:  See H&P Social History:  Social History   Substance and Sexual Activity  Alcohol Use Yes   Comment: sometimes     Social History   Substance and Sexual Activity  Drug Use Not Currently    Social History   Socioeconomic History   Marital status: Single    Spouse name: Not on file   Number of children: Not on file   Years of education: Not on file   Highest education level:  Not on file  Occupational History   Not on file  Tobacco Use   Smoking status: Never   Smokeless tobacco: Never  Vaping Use   Vaping status: Never Used  Substance and Sexual Activity   Alcohol use: Yes    Comment: sometimes   Drug use: Not Currently   Sexual activity: Not on file  Other Topics Concern   Not on file  Social History Narrative   Not on file   Social Determinants of Health   Financial Resource Strain: Not on file  Food Insecurity: No Food Insecurity (09/28/2023)   Hunger Vital Sign    Worried About Running Out of Food in the Last Year: Never true    Ran Out of Food in the Last Year: Never true  Transportation Needs: No Transportation Needs (09/28/2023)   PRAPARE - Administrator, Civil Service (Medical): No    Lack of Transportation (Non-Medical): No  Physical Activity: Not on file  Stress: Not on file  Social Connections: Not on file   Additional Social History:    Sleep: Fair  Appetite:  Good  Current Medications: Current Facility-Administered Medications  Medication Dose Route Frequency Provider Last Rate Last Admin   acetaminophen (TYLENOL) tablet 650 mg  650 mg Oral Q6H PRN Welford Roche, Josephine C, NP       alum & mag hydroxide-simeth (MAALOX/MYLANTA) 200-200-20 MG/5ML suspension 30 mL  30 mL Oral Q4H PRN Welford Roche, Josephine C, NP       hydrOXYzine (ATARAX) tablet 25 mg  25 mg Oral TID PRN Dahlia Byes C, NP       magnesium hydroxide (MILK OF MAGNESIA) suspension 30 mL  30 mL Oral Daily PRN Welford Roche, Josephine C, NP       OLANZapine (ZYPREXA) injection 10 mg  10 mg Intramuscular TID PRN Dahlia Byes C, NP       OLANZapine (ZYPREXA) injection 5 mg  5 mg Intramuscular TID PRN Dahlia Byes C, NP       OLANZapine zydis (ZYPREXA) disintegrating tablet 5 mg  5 mg Oral TID PRN Dahlia Byes C, NP       paliperidone (INVEGA) 24 hr tablet 6 mg  6 mg Oral Daily Bannie Lobban C, FNP   6 mg at 10/01/23 0758   traZODone (DESYREL) tablet 50 mg   50 mg Oral QHS PRN Dahlia Byes C, NP   50 mg at 09/30/23 2240   Lab Results:  No results found for this or any previous visit (from the past 48 hour(s)).  Blood Alcohol level:  Lab Results  Component Value Date   [facility] <10 09/27/2023   ETH <5 09/02/2016   Metabolic Disorder Labs: Lab Results  Component Value Date   HGBA1C 5.3 08/17/2016   MPG 105 08/17/2016   No results found for: "PROLACTIN" Lab Results  Component Value Date   CHOL 160 07/07/2020   TRIG 74 07/07/2020   HDL 27 (L) 07/07/2020   CHOLHDL 3.7 08/17/2016   VLDL 11 08/17/2016   LDLCALC  119 (H) 07/07/2020   LDLCALC 118 (H) 06/25/2018   Physical Findings: AIMS:  , ,  ,  ,    CIWA:    COWS:     Musculoskeletal: Strength & Muscle Tone: within normal limits Gait & Station: normal Patient leans: N/A  Psychiatric Specialty Exam:  Presentation  General Appearance:  Appropriate for Environment; Casual  Eye Contact: Good  Speech: Clear and Coherent  Speech Volume: Normal  Handedness: Right  Mood and Affect  Mood: Anxious  Affect: Other (comment)  Thought Process  Thought Processes: Coherent; Linear  Descriptions of Associations:Intact  Orientation:Full (Time, Place and Person)  Thought Content:Rumination; Perseveration  History of Schizophrenia/Schizoaffective disorder:Yes  Duration of Psychotic Symptoms:Greater than six months  Hallucinations:Hallucinations: None Description of Visual Hallucinations: Denies  Ideas of Reference:Paranoia  Suicidal Thoughts:Suicidal Thoughts: No  Homicidal Thoughts:Homicidal Thoughts: No  Sensorium  Memory: Immediate Fair; Recent Fair  Judgment: Impaired  Insight: Shallow  Executive Functions  Concentration: Fair  Attention Span: Fair  Recall: [employer] of Knowledge: Fair  Language: Fair  Psychomotor Activity  Psychomotor Activity: Psychomotor Activity: Normal  Assets  Assets: Communication Skills;  Resilience  Sleep  Sleep: Sleep: Good Number of Hours of Sleep: 6.25  Physical Exam: Physical Exam Vitals and nursing note reviewed.  Constitutional:      Appearance: Normal appearance.  HENT:     Head: Normocephalic.     Nose: Nose normal.     Mouth/Throat:     Mouth: Mucous membranes are moist.  Eyes:     Extraocular Movements: Extraocular movements intact.  Cardiovascular:     Rate and Rhythm: Normal rate.     Pulses: Normal pulses.  Pulmonary:     Effort: Pulmonary effort is normal.  Abdominal:     Comments: Deferred  Genitourinary:    Comments: Deferred Musculoskeletal:        General: Normal range of motion.     Cervical back: Normal range of motion.  Skin:    General: Skin is warm.  Neurological:     General: No focal deficit present.     Mental Status: He is alert and oriented to person, place, and time.  Psychiatric:        Mood and Affect: Mood normal.        Behavior: Behavior normal.        Thought Content: Thought content normal.    Review of Systems  Constitutional:  Negative for chills and fever.  HENT:  Negative for sore throat.   Eyes:  Negative for blurred vision.  Respiratory:  Negative for cough, sputum production, shortness of breath and wheezing.   Cardiovascular:  Negative for chest pain and palpitations.  Gastrointestinal:  Negative for abdominal pain, diarrhea, heartburn, nausea and vomiting.  Genitourinary:  Negative for dysuria, hematuria and urgency.  Musculoskeletal: Negative.   Skin:  Negative for itching and rash.  Neurological:  Negative for dizziness, tingling, tremors, sensory change, speech change and headaches.  Endo/Heme/Allergies:        See allergy listing  Psychiatric/Behavioral:  Negative for depression, hallucinations and suicidal ideas. The patient is nervous/anxious and has insomnia.    Blood pressure 129/82, pulse 95, temperature 98 F (36.7 C), temperature source Oral, resp. rate 18, height 6\' 3"  (1.905 m), weight  74.4 kg, SpO2 100%. Body mass index is 20.5 kg/m.  Treatment Plan Summary: Daily contact with patient to assess and evaluate symptoms and progress in treatment and Medication management   Observation Level/Precautions:  15 minute  checks  Laboratory:  CBC Chemistry Profile HbAIC HCG UDS  Psychotherapy: Individual and group.  Medications: Begin Invega as prescribed and titrate as tolerated.  Consider LAI. Reevaluate the need for Depakote.  Consultations:    Discharge Concerns: Safety planning and compliance with the ACT team  Estimated LOS: 5 to 7 days.  Other:      Physician Treatment Plan for Primary Diagnosis: Schizophrenia, acute undifferentiated (HCC) Long Term Goal(s): Improvement in symptoms so as ready for discharge   Short Term Goals: Ability to identify changes in lifestyle to reduce recurrence of condition will improve, Ability to disclose and discuss suicidal ideas, Ability to demonstrate self-control will improve, and Compliance with prescribed medications will improve   Physician Treatment Plan for Secondary Diagnosis: Principal Problem:   Schizophrenia, acute undifferentiated (HCC)   Long Term Goal(s): Improvement in symptoms so as ready for discharge   Short Term Goals: Ability to demonstrate self-control will improve, Ability to identify and develop effective coping behaviors will improve, Ability to maintain clinical measurements within normal limits will improve, and Compliance with prescribed medications will improve  Cecilie Lowers, FNP 10/01/2023, 11:01 AM Patient ID: Christiana Fuchs, male   DOB: 1995/12/22, 27 y.o.   MRN: [id number] Patient ID: OUSMANE MENSING, male   DOB: [birth year], 27 y.o.   MRN: [id number]

## 2023-10-01 NOTE — Group Note (Signed)
Recreation Therapy Group Note   Group Topic:Team Building  Group Date: 10/01/2023 Start Time: 0935 End Time: 1015 Facilitators: Abbie Jablon-McCall, LRT,CTRS Location: [address] Dayroom   Group Topic: Communication, Team Building, Problem Solving  Goal Area(s) Addresses:  Patient will effectively work with peer towards shared goal.  Patient will identify skills used to make activity successful.  Patient will identify how skills used during activity can be used to reach post d/c goals.   Intervention: STEM Activity  Group Description: Straw Bridge. In teams of 3-5, patients were given 15 plastic drinking straws and an equal length of masking tape. Using the materials provided, patients were instructed to build a free standing bridge-like structure to suspend an everyday item (ex: puzzle box) off of the floor or table surface. All materials were required to be used by the team in their design. LRT facilitated post-activity discussion reviewing team process. Patients were encouraged to reflect how the skills used in this activity can be generalized to daily life post discharge.   Education: Pharmacist, community, Scientist, physiological, Discharge Planning   Education Outcome: Acknowledges education/In group clarification offered/Needs additional education.    Affect/Mood: Appropriate   Participation Level: None   Participation Quality: Independent   Behavior: Cooperative and On-looking   Speech/Thought Process: Relevant   Insight: None   Judgement: None   Modes of Intervention: STEM Activity   Patient Response to Interventions:  Attentive   Education Outcome:  In group clarification offered    Clinical Observations/Individualized Feedback: Pt came in asking questions about what group was about. Pt left group but later came back near the end. When pt returned, he asked LRT about length of groups and how long this particular group was going to last. LRT answered pt question to which pt  was receptive of. Pt observed peers for remainder of group.     Plan: Continue to engage patient in RT group sessions 2-3x/week.   Leonard Davidson, LRT,CTRS 10/01/2023 12:11 PM

## 2023-10-01 NOTE — Plan of Care (Signed)
?  Problem: Activity: ?Goal: Interest or engagement in activities will improve ?Outcome: Progressing ?Goal: Sleeping patterns will improve ?Outcome: Progressing ?  ?Problem: Coping: ?Goal: Ability to verbalize frustrations and anger appropriately will improve ?Outcome: Progressing ?Goal: Ability to demonstrate self-control will improve ?Outcome: Progressing ?  ?Problem: Safety: ?Goal: Periods of time without injury will increase ?Outcome: Progressing ?  ?

## 2023-10-02 DIAGNOSIS — F203 Undifferentiated schizophrenia: Secondary | ICD-10-CM | POA: Diagnosis not present

## 2023-10-02 MED ORDER — DIVALPROEX SODIUM ER 500 MG PO TB24
500.0000 mg | ORAL_TABLET | Freq: Every day | ORAL | Status: DC
Start: 2023-10-02 — End: 2023-10-03
  Administered 2023-10-02: 500 mg via ORAL
  Filled 2023-10-02 (×4): qty 1

## 2023-10-02 NOTE — Plan of Care (Signed)
  Problem: Education: Goal: Emotional status will improve Outcome: Progressing Goal: Mental status will improve Outcome: Progressing   Problem: Safety: Goal: Periods of time without injury will increase Outcome: Progressing   

## 2023-10-02 NOTE — Progress Notes (Signed)
   10/02/23 2100  Psych Admission Type (Psych Patients Only)  Admission Status Involuntary  Psychosocial Assessment  Patient Complaints Anxiety;Depression  Eye Contact Fair  Facial Expression Anxious  Affect Anxious  Speech Logical/coherent  Interaction Assertive  Motor Activity Slow  Appearance/Hygiene Unremarkable  Behavior Characteristics Cooperative  Mood Anxious;Depressed  Aggressive Behavior  Effect No apparent injury  Thought Process  Coherency Circumstantial  Content Blaming others  Delusions None reported or observed  Perception WDL  Hallucination None reported or observed  Judgment Impaired  Confusion WDL  Danger to Self  Current suicidal ideation? Denies

## 2023-10-02 NOTE — Plan of Care (Signed)
  Problem: Education: Goal: Emotional status will improve Outcome: Progressing Goal: Mental status will improve Outcome: Progressing   Problem: Activity: Goal: Interest or engagement in activities will improve Outcome: Progressing   Problem: Activity: Goal: Sleeping patterns will improve Outcome: Not Progressing

## 2023-10-02 NOTE — Progress Notes (Signed)
   10/01/23 2327  Psych Admission Type (Psych Patients Only)  Admission Status Involuntary  Psychosocial Assessment  Patient Complaints Sleep disturbance;Anxiety  Eye Contact Fair  Facial Expression Anxious  Affect Anxious  Speech Logical/coherent  Interaction Assertive  Motor Activity Pacing;Restless  Appearance/Hygiene Unremarkable  Behavior Characteristics Cooperative;Intrusive  Mood Anxious  Thought Process  Coherency WDL  Content Preoccupation  Delusions WDL  Perception WDL  Hallucination None reported or observed  Judgment Poor  Confusion WDL  Danger to Self  Current suicidal ideation? Denies  Danger to Others  Danger to Others None reported or observed   Pt states he had a good day, states anxiety 0/10 and depression 0/10, observed pacing hallway, initially refused trazodone, but reported he would take it at bedtime, currently denies SI/HI or hallucinations (a) 15 min checks (r) safety maintained.

## 2023-10-02 NOTE — Progress Notes (Signed)
[facility] MD Progress Note  10/02/2023 1:02 PM Leonard Davidson  MRN:  [id number] Subjective:   Leonard Davidson is a 27 yr old male who presented on 11/23 to [facility] due to bizarre and hyper-religious behavior, he was admitted to [facility] on 11/24.  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   Case was discussed in the multidisciplinary team. MAR was reviewed and patient was compliant with medications.  He received PRN Trazodone last night.   Psychiatric Team made the following recommendations yesterday: -Continue Invega 6 mg daily for Mood Stability and Psychosis -Start Depakote ER 500 mg for Mood Stability tomorrow -Continue IVC    On interview today patient reports he slept fair last night.  He reports his appetite is doing good.  He reports no SI, HI, or AVH.  He reports no Paranoia or Ideas of Reference.  He reports no issues with his medications.  He reports that he wants to be discharged.  Discussed with him that we are not there yet but encouraged him to continue attending groups and working on coping skills.  Discussed restarting Gean Birchwood and he reports that he does not want the injection but that he will continue taking the oral pills.  Discussed if family had visited or he had been talking with family on the phone and he reports that he has been calling his mother regularly.  Asked if she could be contacted and he was agreeable with this.  He reports no other concerns at present.   Called patient's mother, Leonard Davidson, [phone].  She reports that he is sounding better.  Discussed with her that he started Depakote today for better mood stability and so the earliest for discharge would be Sunday as blood work would have to be taken and she reported understanding.  She reports no other concerns at present.   Principal Problem:  Schizophrenia, acute undifferentiated (HCC) Diagnosis: Principal Problem:   Schizophrenia, acute undifferentiated (HCC)  Total Time spent with patient:  I personally spent 35 minutes on the unit in direct patient care. The direct patient care time included face-to-face time with the patient, reviewing the patient's chart, communicating with other professionals, and coordinating care. Greater than 50% of this time was spent in counseling or coordinating care with the patient regarding goals of hospitalization, psycho-education, and discharge planning needs.   Past Psychiatric History: Bipolar Disorder and HC Abuse, and 3 Prior Psychiatric Hospitalizations (last [facility] 08/2016), and no history of Suicide Attempts or Self Injurious Behavior.  Past Medical History:  Past Medical History:  Diagnosis Date   Bipolar 1 disorder (HCC)    Seasonal allergies     Past Surgical History:  Procedure Laterality Date   NO PAST SURGERIES     Family History:  Family History  Problem Relation Age of Onset   Diabetes Father    Family Psychiatric  History:  Grandfather- Bipolar Disorder  Social History:  Social History   Substance and Sexual Activity  Alcohol Use Yes   Comment: sometimes     Social History   Substance and Sexual Activity  Drug Use Not Currently    Social History   Socioeconomic History   Marital status: Single    Spouse name: Not on file   Number of children: Not on file   Years of education: Not on file   Highest education level: Not on file  Occupational History   Not on file  Tobacco Use   Smoking status: Never   Smokeless tobacco: Never  Vaping Use   Vaping status: Never Used  Substance and Sexual Activity   Alcohol use: Yes    Comment: sometimes   Drug use: Not Currently   Sexual activity: Not on file  Other Topics Concern   Not on file  Social History Narrative   Not on file   Social Determinants of Health   Financial Resource Strain: Not on file   Food Insecurity: No Food Insecurity (09/28/2023)   Hunger Vital Sign    Worried About Running Out of Food in the Last Year: Never true    Ran Out of Food in the Last Year: Never true  Transportation Needs: No Transportation Needs (09/28/2023)   PRAPARE - Administrator, Civil Service (Medical): No    Lack of Transportation (Non-Medical): No  Physical Activity: Not on file  Stress: Not on file  Social Connections: Not on file   Additional Social History:                         Sleep: Fair  Appetite:  Good  Current Medications: Current Facility-Administered Medications  Medication Dose Route Frequency Provider Last Rate Last Admin   acetaminophen (TYLENOL) tablet 650 mg  650 mg Oral Q6H PRN Welford Roche, Josephine C, NP       alum & mag hydroxide-simeth (MAALOX/MYLANTA) 200-200-20 MG/5ML suspension 30 mL  30 mL Oral Q4H PRN Dahlia Byes C, NP       divalproex (DEPAKOTE ER) 24 hr tablet 500 mg  500 mg Oral Daily Ntuen, Jesusita Oka, FNP   500 mg at 10/02/23 0817   hydrOXYzine (ATARAX) tablet 25 mg  25 mg Oral TID PRN Earney Navy, NP       magnesium hydroxide (MILK OF MAGNESIA) suspension 30 mL  30 mL Oral Daily PRN Welford Roche, Josephine C, NP       OLANZapine (ZYPREXA) injection 10 mg  10 mg Intramuscular TID PRN Dahlia Byes C, NP       OLANZapine (ZYPREXA) injection 5 mg  5 mg Intramuscular TID PRN Dahlia Byes C, NP       OLANZapine zydis (ZYPREXA) disintegrating tablet 5 mg  5 mg Oral TID PRN Dahlia Byes C, NP       paliperidone (INVEGA) 24 hr tablet 6 mg  6 mg Oral Daily Ntuen, Tina C, FNP   6 mg at 10/02/23 0817   traZODone (DESYREL) tablet 50 mg  50 mg Oral QHS PRN Dahlia Byes C, NP   50 mg at 10/01/23 2153    Lab Results: No results found for this or any previous visit (from the past 48 hour(s)).  Blood Alcohol level:  Lab Results  Component Value Date   ETH <10 09/27/2023   ETH <5 09/02/2016    Metabolic Disorder Labs: Lab  Results  Component Value Date   HGBA1C 5.3 08/17/2016   MPG 105 08/17/2016   No results found for: "PROLACTIN" Lab Results  Component Value Date   CHOL 160 07/07/2020   TRIG 74 07/07/2020   HDL 27 (L) 07/07/2020   CHOLHDL 3.7 08/17/2016   VLDL 11 08/17/2016   LDLCALC 119 (H) 07/07/2020   LDLCALC 118 (H) 06/25/2018    Physical Findings: AIMS:  , ,  ,  ,    CIWA:    COWS:     Musculoskeletal: Strength &  Muscle Tone: within normal limits Gait & Station: normal Patient leans: N/A  Psychiatric Specialty Exam:  Presentation  General Appearance:  Appropriate for Environment; Casual  Eye Contact: Good  Speech: Clear and Coherent; Normal Rate  Speech Volume: Normal  Handedness: Right   Mood and Affect  Mood: Dysphoric  Affect: Congruent   Thought Process  Thought Processes: Coherent; Goal Directed  Descriptions of Associations:Intact  Orientation:Full (Time, Place and Person)  Thought Content:Rumination  History of Schizophrenia/Schizoaffective disorder:Yes  Duration of Psychotic Symptoms:Greater than six months  Hallucinations:Hallucinations: None Description of Visual Hallucinations: Denies  Ideas of Reference:None  Suicidal Thoughts:Suicidal Thoughts: No  Homicidal Thoughts:Homicidal Thoughts: No   Sensorium  Memory: Immediate Fair; Recent Fair  Judgment: Intact  Insight: Present   Executive Functions  Concentration: Fair  Attention Span: Fair  Recall: [employer] of Knowledge: Fair  Language: Fair   Psychomotor Activity  Psychomotor Activity:Psychomotor Activity: Normal   Assets  Assets: Communication Skills; Resilience   Sleep  Sleep: Sleep: Fair Number of Hours of Sleep: 4.5    Physical Exam: Physical Exam Vitals and nursing note reviewed.  Constitutional:      General: He is not in acute distress.    Appearance: Normal appearance. He is normal weight. He is not ill-appearing or toxic-appearing.   HENT:     Head: Normocephalic and atraumatic.  Pulmonary:     Effort: Pulmonary effort is normal.  Musculoskeletal:        General: Normal range of motion.  Neurological:     General: No focal deficit present.     Mental Status: He is alert.    Review of Systems  Respiratory:  Negative for cough and shortness of breath.   Cardiovascular:  Negative for chest pain.  Gastrointestinal:  Negative for abdominal pain, constipation, diarrhea, nausea and vomiting.  Neurological:  Negative for dizziness, weakness and headaches.  Psychiatric/Behavioral:  Negative for depression, hallucinations and suicidal ideas. The patient is not nervous/anxious.    Blood pressure 122/67, pulse 74, temperature 98 F (36.7 C), temperature source Oral, resp. rate 18, height 6\' 3"  (1.905 m), weight 74.4 kg, SpO2 100%. Body mass index is 20.5 kg/m.   Treatment Plan Summary: Daily contact with patient to assess and evaluate symptoms and progress in treatment and Medication management  AVITAJ SCHOLLER is a 27 yr old male who presented on 11/23 to [facility] due to bizarre and hyper-religious behavior, he was admitted to [facility] on 11/24.  PPHx is significant for Bipolar Disorder and HC Abuse, and 3 Prior Psychiatric Hospitalizations (last [facility] 08/2016), and no history of Suicide Attempts or Self Injurious Behavior.   Hugo has responded well to restarting his [place].  Discussed Gean Birchwood due to concern for compliance but he is not interested in it.  Called his mother and she reports that he is improving.  He started the Depakote this morning so the soonest we could draw blood work would be Sunday.  We will not make any other changes to his medications at this time.  We will continue to monitor.    Bipolar Disorder: -Continue Invega 6 mg daily for Mood Stability and Psychosis -Start Depakote ER 500 mg for Mood Stability -Continue Agitation Protocol: Zyprexa   -Continue PRN's: Tylenol, Maalox, Atarax, Milk  of Magnesia, Trazodone   Lauro Franklin, MD 10/02/2023, 1:02 PM

## 2023-10-02 NOTE — Progress Notes (Addendum)
Pt having a hard time staying asleep, asking to speak to "male staff members"currently reading a book in his room, states he is not tired anymore, encouraged to get more rest, wanting to speak with physician in morning for medication for allergies, safety maintained.

## 2023-10-02 NOTE — Progress Notes (Signed)
   10/02/23 0558  15 Minute Checks  Location Hallway  Visual Appearance Calm  Behavior Composed  Sleep (Behavioral Health Patients Only)  Calculate sleep? (Click Yes once per 24 hr at 0600 safety check) Yes  Documented sleep last 24 hours 4.75

## 2023-10-02 NOTE — Progress Notes (Addendum)
Pt denies SI/HI/AVH. Patient has been isolative to his room and abusing the call bell to demand staff to bring him various things to his room. Pt reports "If you don't bring me shea butter soap I am not leaving my room all day". Staff notified patient that if he wants things he needs to come to front desk and get them. Pt refuses to go to the dining room for meals. Patient preoccupied with wanting to discharge stating "If you aren't coming to talk to me about discharge then I don't want to talk". Pt continues to be interested in male staff throughout the day. Q 15 minute safety checks in place for patient's safety.    10/02/23 4098  Psych Admission Type (Psych Patients Only)  Admission Status Involuntary  Psychosocial Assessment  Patient Complaints Anxiety;Depression  Eye Contact Fair  Facial Expression Anxious;Animated  Affect Anxious  Speech Logical/coherent  Interaction Assertive  Motor Activity Other (Comment) (WDL)  Appearance/Hygiene Unremarkable  Behavior Characteristics Cooperative;Anxious  Mood Anxious;Depressed  Thought Process  Coherency Circumstantial  Content WDL;Preoccupation (Preoccupied with discharging)  Delusions None reported or observed  Perception WDL  Hallucination None reported or observed  Judgment Poor  Confusion None  Danger to Self  Current suicidal ideation? Denies  Agreement Not to Harm Self Yes  Description of Agreement Pt verbally contracts for safety  Danger to Others  Danger to Others None reported or observed

## 2023-10-02 NOTE — Group Note (Signed)
[facility] LCSW Group Therapy Note  Date/Time: 10/02/2023 at 11:00AM - 12:00PM  Type of Therapy/Topic:  Group Therapy:  Journey and Not the Destination  Participation Level:  Active  Description of Group:   This group will address the importance of considering the journey and not just the destination. Patients will be encouraged to process areas in their lives where they found it hard to focus on the good rather than the bad, and identify reasons for maintaining such thought pattern. Facilitator will guide patients utilizing problem- solving interventions to address and improve the way each patient views themselves and their situation. Patients will work through understanding and applying humility when it comes to life changes and the interactions we have with others. Patients will be encouraged to explore ways that they will move forward throughout life's journey, and make healthier decisions for themselves.   Therapeutic Goals: Patient will identify two or more emotions or situations that they observed or felt throughout watching the video clip. Patient will identify signs where they may have responded to someone or situation due to their current circumstance.  Patient will identify two ways to set better habits in order to achieve more peace in their lives. Patient will demonstrate ability to communicate their needs through discussion and/or role plays.  Summary of Patient Progress: Patient actively participated in group on today. Patient reports that the video clip definitely made him reflect on the way he has probably treated others in the past. Patient reported that at times it is hard think about how your reactions may have affect another person. Patient reports having moments of feeling like others are always against him, so he has to put on a hard shell to protect himself. Patient reports feeling encouraged from watching the video, and his was able to provide feedback to staff and peers.    Therapeutic Modalities:   Cognitive Behavioral Therapy Solution-Focused Therapy Assertiveness Training  Fernande Boyden, LCSW Clinical Social Worker [facility]

## 2023-10-03 ENCOUNTER — Encounter (HOSPITAL_COMMUNITY): Payer: Self-pay

## 2023-10-03 DIAGNOSIS — F203 Undifferentiated schizophrenia: Secondary | ICD-10-CM | POA: Diagnosis not present

## 2023-10-03 MED ORDER — SALINE SPRAY 0.65 % NA SOLN: 1.0000 | NASAL | Status: DC | PRN

## 2023-10-03 MED ORDER — PALIPERIDONE ER 3 MG PO TB24
3.0000 mg | ORAL_TABLET | Freq: Once | ORAL | Status: AC
  Administered 2023-10-03: 3 mg via ORAL
  Filled 2023-10-03: qty 1

## 2023-10-03 MED ORDER — PALIPERIDONE ER 3 MG PO TB24
9.0000 mg | ORAL_TABLET | Freq: Every day | ORAL | Status: DC
  Administered 2023-10-04 – 2023-10-05 (×2): 9 mg via ORAL
  Filled 2023-10-03 (×2): qty 3
  Filled 2023-10-03 (×2): qty 15

## 2023-10-03 MED ORDER — VALPROIC ACID 250 MG/5ML PO SOLN
750.0000 mg | Freq: Every day | ORAL | Status: DC
Start: 2023-10-03 — End: 2023-10-05
  Administered 2023-10-03 – 2023-10-05 (×3): 750 mg via ORAL
  Filled 2023-10-03: qty 15
  Filled 2023-10-03 (×2): qty 75
  Filled 2023-10-03 (×3): qty 15

## 2023-10-03 NOTE — BH IP Treatment Plan (Signed)
Interdisciplinary Treatment and Diagnostic Plan Update  10/03/2023 Time of Session: 12:15 pm-UPDATE Leonard Davidson MRN: [id number]  Principal Diagnosis: Schizophrenia, acute undifferentiated (HCC)  Secondary Diagnoses: Principal Problem:   Schizophrenia, acute undifferentiated (HCC)   Current Medications:  Current Facility-Administered Medications  Medication Dose Route Frequency Provider Last Rate Last Admin   acetaminophen (TYLENOL) tablet 650 mg  650 mg Oral Q6H PRN Dahlia Byes C, NP       alum & mag hydroxide-simeth (MAALOX/MYLANTA) 200-200-20 MG/5ML suspension 30 mL  30 mL Oral Q4H PRN Dahlia Byes C, NP       hydrOXYzine (ATARAX) tablet 25 mg  25 mg Oral TID PRN Earney Navy, NP       magnesium hydroxide (MILK OF MAGNESIA) suspension 30 mL  30 mL Oral Daily PRN Dahlia Byes C, NP       OLANZapine (ZYPREXA) injection 10 mg  10 mg Intramuscular TID PRN Earney Navy, NP       OLANZapine (ZYPREXA) injection 5 mg  5 mg Intramuscular TID PRN Dahlia Byes C, NP       OLANZapine zydis (ZYPREXA) disintegrating tablet 5 mg  5 mg Oral TID PRN Earney Navy, NP       [START ON 10/04/2023] paliperidone (INVEGA) 24 hr tablet 9 mg  9 mg Oral Daily Pashayan, Mardelle Matte, MD       sodium chloride (OCEAN) 0.65 % nasal spray 1 spray  1 spray Each Nare PRN Lauro Franklin, MD       traZODone (DESYREL) tablet 50 mg  50 mg Oral QHS PRN Dahlia Byes C, NP   50 mg at 10/02/23 2120   valproic acid (DEPAKENE) 250 MG/5ML solution 750 mg  750 mg Oral Daily Lauro Franklin, MD   750 mg at 10/03/23 2440   PTA Medications: No medications prior to admission.    Patient Stressors: Medication change or noncompliance    Patient Strengths: Manufacturing systems engineer  Religious Affiliation  Supportive family/friends   Treatment Modalities: Medication Management, Group therapy, Case management,  1 to 1 session with clinician, Psychoeducation,  Recreational therapy.   Physician Treatment Plan for Primary Diagnosis: Schizophrenia, acute undifferentiated (HCC) Long Term Goal(s): Improvement in symptoms so as ready for discharge   Short Term Goals: Ability to demonstrate self-control will improve Ability to identify and develop effective coping behaviors will improve Ability to maintain clinical measurements within normal limits will improve Compliance with prescribed medications will improve Ability to identify changes in lifestyle to reduce recurrence of condition will improve Ability to disclose and discuss suicidal ideas  Medication Management: Evaluate patient's response, side effects, and tolerance of medication regimen.  Therapeutic Interventions: 1 to 1 sessions, Unit Group sessions and Medication administration.  Evaluation of Outcomes: Progressing  Physician Treatment Plan for Secondary Diagnosis: Principal Problem:   Schizophrenia, acute undifferentiated (HCC)  Long Term Goal(s): Improvement in symptoms so as ready for discharge   Short Term Goals: Ability to demonstrate self-control will improve Ability to identify and develop effective coping behaviors will improve Ability to maintain clinical measurements within normal limits will improve Compliance with prescribed medications will improve Ability to identify changes in lifestyle to reduce recurrence of condition will improve Ability to disclose and discuss suicidal ideas     Medication Management: Evaluate patient's response, side effects, and tolerance of medication regimen.  Therapeutic Interventions: 1 to 1 sessions, Unit Group sessions and Medication administration.  Evaluation of Outcomes: Progressing   RN Treatment Plan for Primary Diagnosis:  Schizophrenia, acute undifferentiated (HCC) Long Term Goal(s): Knowledge of disease and therapeutic regimen to maintain health will improve  Short Term Goals: Ability to remain free from injury will improve,  Ability to verbalize frustration and anger appropriately will improve, Ability to participate in decision making will improve, Ability to disclose and discuss suicidal ideas, and Compliance with prescribed medications will improve  Medication Management: RN will administer medications as ordered by provider, will assess and evaluate patient's response and provide education to patient for prescribed medication. RN will report any adverse and/or side effects to prescribing provider.  Therapeutic Interventions: 1 on 1 counseling sessions, Psychoeducation, Medication administration, Evaluate responses to treatment, Monitor vital signs and CBGs as ordered, Perform/monitor CIWA, COWS, AIMS and Fall Risk screenings as ordered, Perform wound care treatments as ordered.  Evaluation of Outcomes: Not Progressing   LCSW Treatment Plan for Primary Diagnosis: Schizophrenia, acute undifferentiated (HCC) Long Term Goal(s): Safe transition to appropriate next level of care at discharge, Engage patient in therapeutic group addressing interpersonal concerns.  Short Term Goals: Engage patient in aftercare planning with referrals and resources, Increase ability to appropriately verbalize feelings, Facilitate acceptance of mental health diagnosis and concerns, and Increase skills for wellness and recovery  Therapeutic Interventions: Assess for all discharge needs, 1 to 1 time with Social worker, Explore available resources and support systems, Assess for adequacy in community support network, Educate family and significant other(s) on suicide prevention, Complete Psychosocial Assessment, Interpersonal group therapy.  Evaluation of Outcomes: Progressing   Progress in Treatment: Attending groups: No. Participating in groups: No. Taking medication as prescribed: No. Toleration medication: No. Family/Significant other contact made: No, will contact:  consents pending Patient understands diagnosis: No. Discussing  patient identified problems/goals with staff: Yes. Medical problems stabilized or resolved: Yes. Denies suicidal/homicidal ideation: Yes. Issues/concerns per patient self-inventory: No. New problem(s) identified: No, Describe:  none reported   New Short Term/Long Term Goal(s): medication stabilization, elimination of SI thoughts, development of comprehensive mental wellness plan.      Patient Goals:  "Get discharged" Pt reports not wanting to contribute to meetings/groups until he gets his "things."   Discharge Plan or Barriers: Patient recently admitted. CSW will continue to follow and assess for appropriate referrals and possible discharge planning.      Reason for Continuation of Hospitalization: Delusions  Medication stabilization   Estimated Length of Stay:5-7  days Reason for Continuation of Hospitalization: Anxiety Depression Hallucinations Last 3 [place] Suicide Severity Risk Score: Flowsheet Row [facility] (Current) [facility] from 09/27/2023 in [facility] ED from 04/06/2022 in [facility]   C-SSRS RISK CATEGORY No Risk No Risk No Risk       Last PHQ 2/9 Scores:    05/15/2021   10:06 AM 10/17/2020    8:55 AM 06/22/2020   11:02 AM  Depression screen PHQ 2/9  Decreased Interest 0 0 0  Down, Depressed, Hopeless 0 0 0  PHQ - 2 Score 0 0 0    Scribe for Treatment Team: Kathrynn Humble 10/03/2023 2:04 PM

## 2023-10-03 NOTE — [facility] Group Notes (Signed)
[facility] Group Notes:  (Nursing/MHT/Case Management/Adjunct)  Date:  10/03/2023  Time:  1:37 AM  Type of Therapy:   Wrap-up group  Participation Level:  Did Not Attend  Participation Quality:    Affect:    Cognitive:    Insight:    Engagement in Group:    Modes of Intervention:    Summary of Progress/Problems:  Leonard Davidson 10/03/2023, 1:37 AM

## 2023-10-03 NOTE — Progress Notes (Addendum)
Pt coming out the room complaining about the things he can't have on the unit" ya'll won't let me have my baby powder, ya'll won't let me have my wipes, ya'll won't let me have my soap" writer tried to explain to pt that items that are banned from coming in here for a reason , pt appeared not to be able to understand the concept of rules"come on you can cut me some slack , you're trying to not make it easy for me" pt was informed that there is a list of items he can't have on the unit, such as baby powder , wipes , soap with alcohol in the first 3 ingredients .

## 2023-10-03 NOTE — Plan of Care (Signed)

## 2023-10-03 NOTE — Progress Notes (Signed)
   10/03/23 2045  Psych Admission Type (Psych Patients Only)  Admission Status Involuntary  Psychosocial Assessment  Patient Complaints None  Eye Contact Fair  Facial Expression Anxious  Affect Anxious  Speech Logical/coherent  Interaction Assertive  Motor Activity Slow  Appearance/Hygiene Unremarkable  Behavior Characteristics Intrusive;Impulsive  Mood Suspicious;Labile;Anxious  Aggressive Behavior  Effect No apparent injury  Thought Process  Coherency Circumstantial  Content Blaming others  Delusions None reported or observed  Perception WDL  Hallucination None reported or observed  Judgment Impaired  Confusion WDL  Danger to Self  Current suicidal ideation? Denies

## 2023-10-03 NOTE — [facility] Suicide Risk Assessment (Signed)
[facility] INPATIENT:  Family/Significant Other Suicide Prevention Education  Suicide Prevention Education:  Education Completed; Leonard Davidson [phone] Grandmother and Mother Leonard Davidson [phone] (name of family member/significant other) has been identified by the patient as the family member/significant other with whom the patient will be residing, and identified as the person(s) who will aid the patient in the event of a mental health crisis (suicidal ideations/suicide attempt).  With written consent from the patient, the family member/significant other has been provided the following suicide prevention education, prior to the and/or following the discharge of the patient.  LCSW went and spoke with patient on today regarding discharge plan.  LCSW explored if patient will provide permission for LCSW to follow up with his mother or grandmother to discharge plan.  Patient provided LCSW with permission to contact either regarding discharge planning.  LCSW contacted the patient's grandmother Leonard Davidson [phone].  Per grandmother, she has no concerns regarding the patient returning home with her.  Grandmother reports as long as the patient is stable and taking his medication she is fine with him returning home.  Grandmother reports when the patient is not taking his meds he does not do well.  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  Grandmother reports his mother Leonard Davidson [phone], takes care of all of his outpatient appointments.  Grandmother reports the mother has been attempting to get in contact with Leonard Davidson to coordinate aftercare. Grandmother has advised LCSW to follow up with the patient's mother regarding discharge planning, as mother would likely be the one to pick up the patient at discharge.  LCSW went and spoke with patient to see if he would be in agreement for LCSW to contact mother.  Patient is in agreement and has provided mother's  contact information.  LCSW contacted the patient's mother Leonard Davidson at number listed above.  Per mother, she has no concerns with the patient returning back home with his grandmother.  Mother reports she has been in contact with [name], and is looking to schedule an appointment for next week.  Mother reports that the facility will likely see the patient daily until stable.  Mother reports she would be the one to pick up the patient once he is stable for discharge.  Mother was informed that the patient will be ready for discharge on Sunday, October 05, 2023.  Mother reports she can arrive at [name] behavioral health at 12:00 PM to transport patient.  Address that the patient will be reporting to is: [address] [place], [place] [address].  Per grandmother and mother, there are no weapons within the home. Their biggest concern is the patient being stabilized on his medication, and having aftercare follow-up.  Mother and grandmother were informed of additional resources provided in AVS for patient follow-up. Mother also reports she will remain in contact with Leonard Davidson report regarding further care.  No other concerns to report at this time patient has been provided an update regarding plan.  Patient expressed appreciation of LCSW assistance with arranging transportation and discharge planning.     The suicide prevention education provided includes the following: Suicide risk factors Suicide prevention and interventions National Suicide Hotline telephone number [name] assessment telephone number [name] Emergency Assistance 911 [name] and/or Residential Mobile Crisis Unit telephone number  Request made of family/significant other to: Remove weapons (e.g., guns, rifles, knives), all items previously/currently identified as safety concern.   Remove drugs/medications (over-the-counter, prescriptions,  illicit drugs), all items previously/currently identified as a  safety concern.  The family member/significant other verbalizes understanding of the suicide prevention education information provided.  The family member/significant other agrees to remove the items of safety concern listed above.  Leonard Davidson 10/03/2023, 11:26 AM

## 2023-10-03 NOTE — Group Note (Signed)
Recreation Therapy Group Note   Group Topic:Problem Solving  Group Date: 10/03/2023 Start Time: 0930 End Time: 1000 Facilitators: Marena Witts-McCall, LRT,CTRS Location: [address] Dayroom   Group Topic: Problem Solving  Goal Area(s) Addresses:  Patient will effectively work in a team with other group members. Patient will verbalize importance of using appropriate problem solving techniques.  Patient will identify positive change associated with effective problem solving skills.   Intervention: Worksheets, Pencils  Group Description: Dentist. Patients were given two sheets of brain teasers. Patients were given 20 minutes to try and figure out as many of teasers they could. Patients were also allowed to work together if they chose to. Once patients finished, LRT would go over the answers with the patients.    Education Outcome: Acknowledges understanding/In group clarification offered/Needs additional education.    Clinical Observations/Individualized Feedback: Due to previous group going over/exceeding time, recreation therapy group was unable to be held at scheduled time.     Plan: Continue to engage patient in RT group sessions 2-3x/week.   Sophiea Ueda-McCall, LRT,CTRS 10/03/2023 1:11 PM

## 2023-10-03 NOTE — Progress Notes (Signed)
Patient ID: NEVAEH HOLLYFIELD, male   DOB: 1995-11-23, 27 y.o.   MRN: [id number] Patient presents with intrusive behaviors, pressured speech and hypomanic mood. Pt up at the desk early this am, leaning over counter asking writer to come speak with him immediately during shift change. When speaking with pt he then goes on to bring up '' when are they going to be banning tick tock? During med pass the patient initially was refusing to take depakene when he saw three cups to make his dose, writer explained that this is due to dosage and that the amount was what was ordered. He did eventually take the medication but then immediately came back to writer stating '' well you need to tell that doctor I can't take that stuff it just made me throw up. ''  Pt has been observed on the unit to be intrusive, disruptive at times repeatedly hitting call light at times. He denies any SI HI or av hallucinations at this time. Will con't to monitor.

## 2023-10-03 NOTE — Plan of Care (Signed)
  Problem: Education: Goal: Emotional status will improve Outcome: Progressing   Problem: Activity: Goal: Interest or engagement in activities will improve Outcome: Progressing Goal: Sleeping patterns will improve Outcome: Progressing   Problem: Coping: Goal: Ability to demonstrate self-control will improve Outcome: Progressing

## 2023-10-03 NOTE — Progress Notes (Signed)
[facility] MD Progress Note  10/03/2023 8:19 AM Leonard Davidson  MRN:  [id number] Subjective:   Leonard Davidson is a 27 yr old male who presented on 11/23 to [facility] due to bizarre and hyper-religious behavior, he was admitted to [facility] on 11/24.  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   Case was discussed in the multidisciplinary team. MAR was reviewed and patient was compliant with medications.  He received PRN Trazodone last night.  Nursing reports that he has been hitting the call bell and stating he is going to refuse medications.   Psychiatric Team made the following recommendations yesterday: -Continue Invega 6 mg daily for Mood Stability and Psychosis -Start Depakote ER 500 mg for Mood Stability -Continue IVC    On interview today patient reports he slept good last night.  He reports his appetite is doing good.  He reports no SI, HI, or AVH.  He reports no Paranoia or Ideas of Reference.  He reports no issues with his medications except that the Depakote pill is too large and is having trouble swallowing it.  Discussed we could trial liquid Depakote and he was agreeable to the change.  He continues to ask for discharge.  Discussed with him that his acting out would not help with his discharge.  Encouraged him to continue taking his medications and not acting out.  When asked if he had reconsidered taking the [place] he reports he wants to continue with the pills.  He reports no other concerns at present.    Principal Problem: Schizophrenia, acute undifferentiated (HCC) Diagnosis: Principal Problem:   Schizophrenia, acute undifferentiated (HCC)  Total Time spent with patient:  I personally spent 35 minutes on the unit in direct patient care. The direct patient care time included face-to-face time with the patient, reviewing the patient's  chart, communicating with other professionals, and coordinating care. Greater than 50% of this time was spent in counseling or coordinating care with the patient regarding goals of hospitalization, psycho-education, and discharge planning needs.   Past Psychiatric History: Bipolar Disorder and HC Abuse, and 3 Prior Psychiatric Hospitalizations (last [facility] 08/2016), and no history of Suicide Attempts or Self Injurious Behavior.  Past Medical History:  Past Medical History:  Diagnosis Date   Bipolar 1 disorder (HCC)    Seasonal allergies     Past Surgical History:  Procedure Laterality Date   NO PAST SURGERIES     Family History:  Family History  Problem Relation Age of Onset   Diabetes Father    Family Psychiatric  History:  Grandfather- Bipolar Disorder  Social History:  Social History   Substance and Sexual Activity  Alcohol Use Yes   Comment: sometimes     Social History   Substance and Sexual Activity  Drug Use Not Currently    Social History   Socioeconomic History   Marital status: Single    Spouse name: Not on file   Number of children: Not on file   Years of education: Not on file   Highest education level: Not on file  Occupational History   Not on file  Tobacco Use   Smoking status: Never   Smokeless tobacco: Never  Vaping Use   Vaping status: Never Used  Substance and Sexual Activity   Alcohol use: Yes    Comment: sometimes   Drug use: Not Currently   Sexual  activity: Not on file  Other Topics Concern   Not on file  Social History Narrative   Not on file   Social Determinants of Health   Financial Resource Strain: Not on file  Food Insecurity: No Food Insecurity (09/28/2023)   Hunger Vital Sign    Worried About Running Out of Food in the Last Year: Never true    Ran Out of Food in the Last Year: Never true  Transportation Needs: No Transportation Needs (09/28/2023)   PRAPARE - Administrator, Civil Service (Medical): No    Lack  of Transportation (Non-Medical): No  Physical Activity: Not on file  Stress: Not on file  Social Connections: Not on file   Additional Social History:                         Sleep: Good  Appetite:  Good  Current Medications: Current Facility-Administered Medications  Medication Dose Route Frequency Provider Last Rate Last Admin   acetaminophen (TYLENOL) tablet 650 mg  650 mg Oral Q6H PRN Welford Roche, Josephine C, NP       alum & mag hydroxide-simeth (MAALOX/MYLANTA) 200-200-20 MG/5ML suspension 30 mL  30 mL Oral Q4H PRN Welford Roche, Josephine C, NP       hydrOXYzine (ATARAX) tablet 25 mg  25 mg Oral TID PRN Dahlia Byes C, NP       magnesium hydroxide (MILK OF MAGNESIA) suspension 30 mL  30 mL Oral Daily PRN Welford Roche, Josephine C, NP       OLANZapine (ZYPREXA) injection 10 mg  10 mg Intramuscular TID PRN Dahlia Byes C, NP       OLANZapine (ZYPREXA) injection 5 mg  5 mg Intramuscular TID PRN Dahlia Byes C, NP       OLANZapine zydis (ZYPREXA) disintegrating tablet 5 mg  5 mg Oral TID PRN Dahlia Byes C, NP       paliperidone (INVEGA) 24 hr tablet 6 mg  6 mg Oral Daily Ntuen, Tina C, FNP   6 mg at 10/03/23 0757   sodium chloride (OCEAN) 0.65 % nasal spray 1 spray  1 spray Each Nare PRN Lauro Franklin, MD       traZODone (DESYREL) tablet 50 mg  50 mg Oral QHS PRN Dahlia Byes C, NP   50 mg at 10/02/23 2120   valproic acid (DEPAKENE) 250 MG/5ML solution 750 mg  750 mg Oral Daily Lauro Franklin, MD   750 mg at 10/03/23 4696    Lab Results: No results found for this or any previous visit (from the past 48 hour(s)).  Blood Alcohol level:  Lab Results  Component Value Date   ETH <10 09/27/2023   ETH <5 09/02/2016    Metabolic Disorder Labs: Lab Results  Component Value Date   HGBA1C 5.3 08/17/2016   MPG 105 08/17/2016   No results found for: "PROLACTIN" Lab Results  Component Value Date   CHOL 160 07/07/2020   TRIG 74 07/07/2020   HDL 27  (L) 07/07/2020   CHOLHDL 3.7 08/17/2016   VLDL 11 08/17/2016   LDLCALC 119 (H) 07/07/2020   LDLCALC 118 (H) 06/25/2018    Physical Findings: AIMS:  , ,  ,  ,    CIWA:    COWS:     Musculoskeletal: Strength & Muscle Tone: within normal limits Gait & Station: normal Patient leans: N/A  Psychiatric Specialty Exam:  Presentation  General Appearance:  Appropriate for Environment; Casual  Eye  Contact: Good  Speech: Clear and Coherent; Normal Rate  Speech Volume: Normal  Handedness: Right   Mood and Affect  Mood: Dysphoric; Anxious  Affect: Congruent   Thought Process  Thought Processes: Coherent; Goal Directed  Descriptions of Associations:Intact  Orientation:Full (Time, Place and Person)  Thought Content:Rumination (about discharge)  History of Schizophrenia/Schizoaffective disorder:Yes  Duration of Psychotic Symptoms:Greater than six months  Hallucinations:Hallucinations: None  Ideas of Reference:None  Suicidal Thoughts:Suicidal Thoughts: No  Homicidal Thoughts:Homicidal Thoughts: No   Sensorium  Memory: Immediate Fair; Recent Fair  Judgment: Intact  Insight: Present   Executive Functions  Concentration: Fair  Attention Span: Fair  Recall: [employer] of Knowledge: Fair  Language: Fair   Psychomotor Activity  Psychomotor Activity:Psychomotor Activity: Normal   Assets  Assets: Communication Skills; Resilience   Sleep  Sleep: Sleep: Good Number of Hours of Sleep: 4.5    Physical Exam: Physical Exam Vitals and nursing note reviewed.  Constitutional:      General: He is not in acute distress.    Appearance: Normal appearance. He is normal weight. He is not ill-appearing or toxic-appearing.  HENT:     Head: Normocephalic and atraumatic.  Pulmonary:     Effort: Pulmonary effort is normal.  Musculoskeletal:        General: Normal range of motion.  Neurological:     General: No focal deficit present.      Mental Status: He is alert.    Review of Systems  Respiratory:  Negative for cough and shortness of breath.   Cardiovascular:  Negative for chest pain.  Gastrointestinal:  Negative for abdominal pain, constipation, diarrhea, nausea and vomiting.  Neurological:  Negative for dizziness, weakness and headaches.  Psychiatric/Behavioral:  Negative for depression, hallucinations and suicidal ideas. The patient is not nervous/anxious.    Blood pressure 133/76, pulse 63, temperature 98 F (36.7 C), temperature source Oral, resp. rate 16, height 6\' 3"  (1.905 m), weight 74.4 kg, SpO2 100%. Body mass index is 20.5 kg/m.   Treatment Plan Summary: Daily contact with patient to assess and evaluate symptoms and progress in treatment and Medication management  KASETON SCITES is a 27 yr old male who presented on 11/23 to [facility] due to bizarre and hyper-religious behavior, he was admitted to [facility] on 11/24.  PPHx is significant for Bipolar Disorder and HC Abuse, and 3 Prior Psychiatric Hospitalizations (last [facility] 08/2016), and no history of Suicide Attempts or Self Injurious Behavior.   [employer] continues to decline the [place].  He is reporting difficulty swallowing the Depakote pill so we will change to Depakene.  Due to his continued intrusiveness and acting out we will increase his Invega.   Bipolar Disorder: -Continue Invega 6 mg daily for Mood Stability and Psychosis -Change Depakote ER 500 mg to Depakene 750 mg for Mood Stability -Continue Agitation Protocol: Zyprexa   -Continue PRN's: Tylenol, Maalox, Atarax, Milk of Magnesia, Trazodone   Lauro Franklin, MD 10/03/2023, 8:19 AM

## 2023-10-04 DIAGNOSIS — F203 Undifferentiated schizophrenia: Secondary | ICD-10-CM | POA: Diagnosis not present

## 2023-10-04 NOTE — Progress Notes (Signed)
[facility] MD Progress Note  10/04/2023 8:55 AM Leonard Davidson  MRN:  [id number] Subjective:   Leonard Davidson is a 27 yr old male who presented on 11/23 to [facility] due to bizarre and hyper-religious behavior, he was admitted to [facility] on 11/24.  PPHx is significant for Bipolar Disorder and THC Abuse, and 3 Prior Psychiatric Hospitalizations (last [facility] 08/2016), and no history of Suicide Attempts or Self Injurious Behavior.   Case was discussed in the multidisciplinary team. MAR was reviewed and patient was compliant with medications.  He received PRN Trazodone and Hydroxyzine last night.    Psychiatric Team made the following recommendations yesterday: -Increase Invega to 9 mg daily for Mood Stability and Psychosis -Change Depakote ER 500 mg to Depakene 750 mg for Mood Stability -Continue IVC   On interview today patient reports he slept good last night.  He reports his appetite is doing good.  He reports no SI, HI, or AVH.  He reports no Paranoia or Ideas of Reference.  He reports no issues with his medications.  He reports his mood is good today.  He reports looking forward to his mother coming to get him tomorrow.  Discussed with him that he would be provided with samples of his medications.  Discussed with him the importance of continuing to take his medications as prescribed.  Discussed we would get lab work tomorrow morning.  He reports no other concerns at present.    Principal Problem: Schizophrenia, acute undifferentiated (HCC) Diagnosis: Principal Problem:   Schizophrenia, acute undifferentiated (HCC)  Total Time spent with patient:  I personally spent 35 minutes on the unit in direct patient care. The direct patient care time included face-to-face time with the patient, reviewing the patient's chart, communicating with other professionals, and coordinating care. Greater than 50% of this time was spent in counseling or coordinating care with the patient regarding goals of  hospitalization, psycho-education, and discharge planning needs.   Past Psychiatric History: Bipolar Disorder and THC Abuse, and 3 Prior Psychiatric Hospitalizations (last [facility] 08/2016), and no history of Suicide Attempts or Self Injurious Behavior.  Past Medical History:  Past Medical History:  Diagnosis Date   Bipolar 1 disorder (HCC)    Seasonal allergies     Past Surgical History:  Procedure Laterality Date   NO PAST SURGERIES     Family History:  Family History  Problem Relation Age of Onset   Diabetes Father    Family Psychiatric  History:  Grandfather- Bipolar Disorder  Social History:  Social History   Substance and Sexual Activity  Alcohol Use Yes   Comment: sometimes     Social History   Substance and Sexual Activity  Drug Use Not Currently    Social History   Socioeconomic History   Marital status: Single    Spouse name: Not on file   Number of children: Not on file   Years of education: Not on file   Highest education level: Not on file  Occupational History   Not on file  Tobacco Use   Smoking status: Never   Smokeless tobacco: Never  Vaping Use   Vaping status: Never Used  Substance and Sexual Activity   Alcohol use: Yes    Comment: sometimes   Drug use: Not Currently   Sexual activity: Not on file  Other Topics Concern   Not on file  Social History Narrative   Not on file   Social Determinants of Health   Financial Resource Strain: Not on  file  Food Insecurity: No Food Insecurity (09/28/2023)   Hunger Vital Sign    Worried About Running Out of Food in the Last Year: Never true    Ran Out of Food in the Last Year: Never true  Transportation Needs: No Transportation Needs (09/28/2023)   PRAPARE - Administrator, Civil Service (Medical): No    Lack of Transportation (Non-Medical): No  Physical Activity: Not on file  Stress: Not on file  Social Connections: Not on file   Additional Social History:                          Sleep: Good  Appetite:  Good  Current Medications: Current Facility-Administered Medications  Medication Dose Route Frequency Provider Last Rate Last Admin   acetaminophen (TYLENOL) tablet 650 mg  650 mg Oral Q6H PRN Dahlia Byes C, NP       alum & mag hydroxide-simeth (MAALOX/MYLANTA) 200-200-20 MG/5ML suspension 30 mL  30 mL Oral Q4H PRN Dahlia Byes C, NP       hydrOXYzine (ATARAX) tablet 25 mg  25 mg Oral TID PRN Dahlia Byes C, NP   25 mg at 10/03/23 2127   magnesium hydroxide (MILK OF MAGNESIA) suspension 30 mL  30 mL Oral Daily PRN Earney Navy, NP       OLANZapine (ZYPREXA) injection 10 mg  10 mg Intramuscular TID PRN Earney Navy, NP       OLANZapine (ZYPREXA) injection 5 mg  5 mg Intramuscular TID PRN Dahlia Byes C, NP       OLANZapine zydis (ZYPREXA) disintegrating tablet 5 mg  5 mg Oral TID PRN Dahlia Byes C, NP       paliperidone (INVEGA) 24 hr tablet 9 mg  9 mg Oral Daily Lauro Franklin, MD   9 mg at 10/04/23 2956   sodium chloride (OCEAN) 0.65 % nasal spray 1 spray  1 spray Each Nare PRN Lauro Franklin, MD       traZODone (DESYREL) tablet 50 mg  50 mg Oral QHS PRN Dahlia Byes C, NP   50 mg at 10/03/23 2127   valproic acid (DEPAKENE) 250 MG/5ML solution 750 mg  750 mg Oral Daily Lauro Franklin, MD   750 mg at 10/04/23 2130    Lab Results: No results found for this or any previous visit (from the past 48 hour(s)).  Blood Alcohol level:  Lab Results  Component Value Date   ETH <10 09/27/2023   ETH <5 09/02/2016    Metabolic Disorder Labs: Lab Results  Component Value Date   HGBA1C 5.3 08/17/2016   MPG 105 08/17/2016   No results found for: "PROLACTIN" Lab Results  Component Value Date   CHOL 160 07/07/2020   TRIG 74 07/07/2020   HDL 27 (L) 07/07/2020   CHOLHDL 3.7 08/17/2016   VLDL 11 08/17/2016   LDLCALC 119 (H) 07/07/2020   LDLCALC 118 (H) 06/25/2018    Physical  Findings: AIMS:  , ,  ,  ,    CIWA:    COWS:     Musculoskeletal: Strength & Muscle Tone: within normal limits Gait & Station: normal Patient leans: N/A  Psychiatric Specialty Exam:  Presentation  General Appearance:  Appropriate for Environment; Casual  Eye Contact: Good  Speech: Clear and Coherent; Normal Rate  Speech Volume: Normal  Handedness: Right   Mood and Affect  Mood: -- ("ok")  Affect: Congruent; Appropriate   Thought  Process  Thought Processes: Coherent; Goal Directed  Descriptions of Associations:Intact  Orientation:Full (Time, Place and Person)  Thought Content:WDL; Logical  History of Schizophrenia/Schizoaffective disorder:Yes  Duration of Psychotic Symptoms:Greater than six months  Hallucinations:Hallucinations: None  Ideas of Reference:None  Suicidal Thoughts:Suicidal Thoughts: No  Homicidal Thoughts:Homicidal Thoughts: No   Sensorium  Memory: Immediate Fair; Recent Fair  Judgment: Intact  Insight: Present   Executive Functions  Concentration: Good  Attention Span: Good  Recall: Good  Fund of Knowledge: Good  Language: Good   Psychomotor Activity  Psychomotor Activity:Psychomotor Activity: Normal   Assets  Assets: Communication Skills; Resilience; Physical Health; Desire for Improvement   Sleep  Sleep: Sleep: Good    Physical Exam: Physical Exam Vitals and nursing note reviewed.  Constitutional:      General: He is not in acute distress.    Appearance: Normal appearance. He is normal weight. He is not ill-appearing or toxic-appearing.  HENT:     Head: Normocephalic and atraumatic.  Pulmonary:     Effort: Pulmonary effort is normal.  Musculoskeletal:        General: Normal range of motion.  Neurological:     General: No focal deficit present.     Mental Status: He is alert.    Review of Systems  Respiratory:  Negative for cough and shortness of breath.   Cardiovascular:  Negative  for chest pain.  Gastrointestinal:  Negative for abdominal pain, constipation, diarrhea, nausea and vomiting.  Neurological:  Negative for dizziness, weakness and headaches.  Psychiatric/Behavioral:  Negative for depression, hallucinations and suicidal ideas. The patient is not nervous/anxious.    Blood pressure 114/71, pulse 74, temperature 98 F (36.7 C), temperature source Oral, resp. rate 16, height 6\' 3"  (1.905 m), weight 74.4 kg, SpO2 100%. Body mass index is 20.5 kg/m.   Treatment Plan Summary: Daily contact with patient to assess and evaluate symptoms and progress in treatment and Medication management  VEGA TZINTZUN is a 27 yr old male who presented on 11/23 to [facility] due to bizarre and hyper-religious behavior, he was admitted to [facility] on 11/24.  PPHx is significant for Bipolar Disorder and THC Abuse, and 3 Prior Psychiatric Hospitalizations (last [facility] 08/2016), and no history of Suicide Attempts or Self Injurious Behavior.   Shelly has tolerated the increase in his [place].  He also tolerated the Depakene.  Re-enforced the importance of taking his medications.  We will not make any changes to his medications at this time.  We will draw a CMP and Depakote Level to monitor.  We will plan for discharge tomorrow.  We will continue to monitor.    Bipolar Disorder: -Continue Invega 9 mg daily for Mood Stability and Psychosis -Change Depakene 750 mg daily for Mood Stability -Continue Agitation Protocol: Zyprexa   -Continue nasal saline 1 spray each nare PRN -Continue PRN's: Tylenol, Maalox, Atarax, Milk of Magnesia, Trazodone   Lauro Franklin, MD 10/04/2023, 8:55 AM

## 2023-10-04 NOTE — Group Note (Signed)
LCSW Group Therapy Note  Group Date: 10/04/2023 Start Time: 1000 End Time: 1100   Type of Therapy and Topic:  Group Therapy - Healthy vs Unhealthy Coping Skills  Participation Level:  Did Not Attend   Description of Group The focus of this group was to determine what unhealthy coping techniques typically are used by group members and what healthy coping techniques would be helpful in coping with various problems. Patients were guided in becoming aware of the differences between healthy and unhealthy coping techniques. Patients were asked to identify 2-3 healthy coping skills they would like to learn to use more effectively.  Therapeutic Goals Patients learned that coping is what human beings do all day long to deal with various situations in their lives Patients defined and discussed healthy vs unhealthy coping techniques Patients identified their preferred coping techniques and identified whether these were healthy or unhealthy Patients determined 2-3 healthy coping skills they would like to become more familiar with and use more often. Patients provided support and ideas to each other   Summary of Patient Progress: Patient was invited to group, did not attend.    Therapeutic Modalities Cognitive Behavioral Therapy Motivational Interviewing  Burnard Bunting 10/04/2023  11:20 AM

## 2023-10-04 NOTE — Group Note (Signed)
Date:  10/04/2023 Time:  9:46 PM  Group Topic/Focus:  Wrap-Up Group:   The focus of this group is to help patients review their daily goal of treatment and discuss progress on daily workbooks.    Participation Level:  Did Not Attend  Participation Quality:   N/A  Affect:   N/A  Cognitive:   N/A  Insight: None  Engagement in Group:   N/A  Modes of Intervention:   N/A  Additional Comments:  Patient did not attend wrap up group.   Kennieth Francois 10/04/2023, 9:46 PM

## 2023-10-04 NOTE — [facility] Group Notes (Signed)
[facility] Group Notes:  (Nursing/MHT/Case Management/Adjunct)  Date:  10/04/2023  Time:  2:36 PM  Type of Therapy:  Psychoeducational Skills  Participation Level:  Did Not Attend  Participation Quality:   na  Affect:    Cognitive:    Insight:    Engagement in Group:    Modes of Intervention:    Summary of Progress/Problems: Psychoeducational group in which patients were educated on the impact of negative thinking, positive reframing and mindfulness. Pt did not attend despite enocuragement.  Malva Limes 10/04/2023, 2:36 PM

## 2023-10-04 NOTE — [facility] Suicide Risk Assessment (Signed)
[facility] INPATIENT:  Family/Significant Other Suicide Prevention Education  Suicide Prevention Education:  Education Completed; mother Damaris Hippo [phone],  (name of family member/significant other) has been identified by the patient as the family member/significant other with whom the patient will be residing, and identified as the person(s) who will aid the patient in the event of a mental health crisis (suicidal ideations/suicide attempt).    Mother reported that patient has absolutely no access to firearms.  He lives with his grandmother because it was advised that he stay with somebody who is home all the time.  Mother will pick him up at 12:00pm on Sunday 12/1.  He is already connected with [facility] and will be seen Monday 12/2.  The team has informed mother that he will be seen daily at this time because of being in crisis.  She also stated there is no veracity to his claim that he has not seen his team in months.  With written consent from the patient, the family member/significant other has been provided the following suicide prevention education, prior to the and/or following the discharge of the patient.  The suicide prevention education provided includes the following: Suicide risk factors Suicide prevention and interventions National Suicide Hotline telephone number [facility] assessment telephone number [facility] Emergency Assistance 911 [facility] and/or Residential Mobile Crisis Unit telephone number  Request made of family/significant other to: Remove weapons (e.g., guns, rifles, knives), all items previously/currently identified as safety concern.   Remove drugs/medications (over-the-counter, prescriptions, illicit drugs), all items previously/currently identified as a safety concern.  The family member/significant other verbalizes understanding of the suicide prevention education information provided.  The family member/significant other agrees  to remove the items of safety concern listed above.  Leonard Davidson 10/04/2023, 3:46 PM

## 2023-10-04 NOTE — Plan of Care (Signed)
?  Problem: Education: ?Goal: Mental status will improve ?Outcome: Progressing ?Goal: Verbalization of understanding the information provided will improve ?Outcome: Progressing ?  ?

## 2023-10-04 NOTE — Progress Notes (Signed)
   10/04/23 1000  Psych Admission Type (Psych Patients Only)  Admission Status Involuntary  Psychosocial Assessment  Patient Complaints None  Eye Contact Fair  Facial Expression Anxious  Affect Anxious  Speech Logical/coherent  Interaction Assertive  Motor Activity Slow  Appearance/Hygiene Unremarkable  Behavior Characteristics Impulsive;Intrusive  Mood Anxious  Thought Process  Coherency Circumstantial  Content Blaming others  Delusions None reported or observed  Perception WDL  Hallucination None reported or observed  Judgment Poor  Confusion None  Danger to Self  Current suicidal ideation? Denies  Danger to Others  Danger to Others None reported or observed

## 2023-10-05 DIAGNOSIS — F203 Undifferentiated schizophrenia: Secondary | ICD-10-CM | POA: Diagnosis not present

## 2023-10-05 LAB — COMPREHENSIVE METABOLIC PANEL
ALT: 18 U/L (ref 0–44)
AST: 18 U/L (ref 15–41)
Albumin: 4.5 g/dL (ref 3.5–5.0)
Alkaline Phosphatase: 77 U/L (ref 38–126)
Anion gap: 7 (ref 5–15)
BUN: 8 mg/dL (ref 6–20)
CO2: 26 mmol/L (ref 22–32)
Calcium: 8.8 mg/dL — ABNORMAL LOW (ref 8.9–10.3)
Chloride: 105 mmol/L (ref 98–111)
Creatinine, Ser: 0.86 mg/dL (ref 0.61–1.24)
GFR, Estimated: >60 mL/min (ref >60)
Glucose, Bld: 80 mg/dL (ref 70–99)
Potassium: 4 mmol/L (ref 3.5–5.1)
Sodium: 138 mmol/L (ref 135–145)
Total Bilirubin: 0.8 mg/dL (ref <1.2)
Total Protein: 7 g/dL (ref 6.5–8.1)

## 2023-10-05 LAB — VALPROIC ACID LEVEL: Valproic Acid Lvl: 31 ug/mL — ABNORMAL LOW (ref 50.0–100.0)

## 2023-10-05 MED ORDER — PALIPERIDONE ER 9 MG PO TB24: 9.0000 mg | ORAL_TABLET | Freq: Every day | ORAL | 0 refills | Status: DC

## 2023-10-05 MED ORDER — VALPROIC ACID 250 MG/5ML PO SOLN: 750.0000 mg | Freq: Every day | ORAL | 0 refills | Status: DC

## 2023-10-05 NOTE — [facility] Suicide Risk Assessment (Addendum)
[facility] Discharge Suicide Risk Assessment   Principal Problem: Schizophrenia, acute undifferentiated (HCC) Discharge Diagnoses: Principal Problem:   Schizophrenia, acute undifferentiated (HCC)   During the patient's hospitalization, patient had extensive initial psychiatric evaluation, and follow-up psychiatric evaluations every day.  Psychiatric diagnoses provided upon initial assessment: Schizophrenia  Patient's psychiatric medications were adjusted on admission: Restarted Invega.  During the hospitalization, other adjustments were made to the patient's psychiatric medication regimen: He was titrated on Invega.  He was offered [place] multiple times but he declined and wanted to continue taking oral Invega.  He was also started on Depakote for additional mood stability.  Gradually, patient started adjusting to milieu.   Patient's care was discussed during the interdisciplinary team meeting every day during the hospitalization.  The patient is not having side effects to prescribed psychiatric medication.  The patient reports their target psychiatric symptoms of visual hallucinations and hyper-religiosity  responded well to the psychiatric medications, and the patient reports overall benefit other psychiatric hospitalization. Supportive psychotherapy was provided to the patient. The patient also participated in regular group therapy while admitted.   Labs were reviewed with the patient, and abnormal results were discussed with the patient.  The patient denied having suicidal thoughts more than 48 hours prior to discharge.  Patient denies having homicidal thoughts.  Patient denies having auditory hallucinations.  Patient denies any visual hallucinations.  Patient denies having paranoid thoughts.  The patient is able to verbalize their individual safety plan to this provider.  It is recommended to the patient to continue psychiatric medications as prescribed, after discharge from the  hospital.    It is recommended to the patient to follow up with your outpatient psychiatric provider and PCP.  Discussed with the patient, the impact of alcohol, drugs, tobacco have been there overall psychiatric and medical wellbeing, and total abstinence from substance use was recommended the patient.   Total Time spent with patient: 20 minutes  Musculoskeletal: Strength & Muscle Tone: within normal limits Gait & Station: normal Patient leans: N/A  Psychiatric Specialty Exam  Presentation  General Appearance:  Appropriate for Environment; Casual  Eye Contact: Good  Speech: Clear and Coherent; Normal Rate  Speech Volume: Normal  Handedness: Right   Mood and Affect  Mood: Euthymic  Duration of Depression Symptoms: No data recorded Affect: Congruent; Appropriate   Thought Process  Thought Processes: Coherent; Goal Directed  Descriptions of Associations:Intact  Orientation:Full (Time, Place and Person)  Thought Content:Logical; WDL  History of Schizophrenia/Schizoaffective disorder:Yes  Duration of Psychotic Symptoms:Greater than six months  Hallucinations:Hallucinations: None  Ideas of Reference:None  Suicidal Thoughts:Suicidal Thoughts: No  Homicidal Thoughts:Homicidal Thoughts: No   Sensorium  Memory: Immediate Fair; Recent Fair  Judgment: Fair  Insight: Fair   Art therapist  Concentration: Good  Attention Span: Good  Recall: Good  Fund of Knowledge: Good  Language: Good   Psychomotor Activity  Psychomotor Activity: Psychomotor Activity: Normal   Assets  Assets: Communication Skills; Desire for Improvement; Resilience; Physical Health   Sleep  Sleep: Sleep: Good Number of Hours of Sleep: 8   Physical Exam: Physical Exam Vitals and nursing note reviewed.  Constitutional:      General: He is not in acute distress.    Appearance: Normal appearance. He is normal weight. He is not ill-appearing or  toxic-appearing.  HENT:     Head: Normocephalic and atraumatic.  Pulmonary:     Effort: Pulmonary effort is normal.  Musculoskeletal:        General: Normal  range of motion.  Neurological:     General: No focal deficit present.     Mental Status: He is alert.    Review of Systems  Respiratory:  Negative for cough and shortness of breath.   Cardiovascular:  Negative for chest pain.  Gastrointestinal:  Negative for abdominal pain, constipation, diarrhea, nausea and vomiting.  Neurological:  Negative for dizziness, weakness and headaches.  Psychiatric/Behavioral:  Negative for depression, hallucinations and suicidal ideas. The patient is not nervous/anxious.    Blood pressure 122/68, pulse 69, temperature 97.6 F (36.4 C), temperature source Oral, resp. rate 18, height 6\' 3"  (1.905 m), weight 74.4 kg, SpO2 100%. Body mass index is 20.5 kg/m.  Mental Status Per Nursing Assessment::   On Admission:  NA  Demographic Factors:  Male  Loss Factors: NA  Historical Factors: Impulsivity  Risk Reduction Factors:   Living with another person, especially a relative and Positive social support  Continued Clinical Symptoms:  Schizophrenia:   Less than 51 years old Previous Psychiatric Diagnoses and Treatments  Cognitive Features That Contribute To Risk:  None    Suicide Risk:  Minimal: No identifiable suicidal ideation.  Patients presenting with no risk factors but with morbid ruminations; may be classified as minimal risk based on the severity of the depressive symptoms   Follow-up Information     [facility]. Go on 10/13/2023.   Why: You have a hospital follow up appointment on 10/13/23 at 10:00 am. The appointment will be held in person. Following this appointment, you will be scheduled for a clinical assessment, to obtain therapy and medication management services.  IF YOUR SERVICES WITH ACT[facility] RESUME AS PLANNED, YOU DO NOT NEED TO KEEP THIS APPOINTMENT. Contact  information: [address] [place] [address] [phone]         [facility] [place] [employer] Call on 10/06/2023.   Why: Per mother, your ACTT team will see you on Monday 12/2. Contact information: [address] [place] [address] [phone]                 Plan Of Care/Follow-up recommendations:  Activity: as tolerated  Diet: heart healthy  Other: -Follow-up with your outpatient psychiatric provider -instructions on appointment date, time, and address (location) are provided to you in discharge paperwork.  -Take your psychiatric medications as prescribed at discharge - instructions are provided to you in the discharge paperwork  -Follow-up with outpatient primary care doctor and other specialists -for management of chronic medical disease, including: None  -Testing: Follow-up with outpatient provider for abnormal lab results: None  -Recommend abstinence from alcohol, tobacco, and other illicit drug use at discharge.   -If your psychiatric symptoms recur, worsen, or if you have side effects to your psychiatric medications, call your outpatient psychiatric provider, 911, [phone] or go to the nearest emergency department.  -If suicidal thoughts recur, call your outpatient psychiatric provider, 911, [phone] or go to the nearest emergency department.   Lauro Franklin, MD 10/05/2023, 10:02 AM

## 2023-10-05 NOTE — Progress Notes (Signed)
   10/05/23 0900  Psych Admission Type (Psych Patients Only)  Admission Status Involuntary  Psychosocial Assessment  Patient Complaints None  Eye Contact Fair  Facial Expression Animated  Affect Appropriate to circumstance  Speech Logical/coherent  Interaction Assertive  Motor Activity Slow  Appearance/Hygiene Unremarkable  Behavior Characteristics Calm;Anxious  Mood Anxious;Pleasant  Aggressive Behavior  Effect No apparent injury  Thought Process  Coherency Circumstantial  Content WDL  Delusions None reported or observed  Perception WDL  Hallucination None reported or observed  Judgment Poor  Confusion None  Danger to Self  Current suicidal ideation? Denies  Danger to Others  Danger to Others None reported or observed

## 2023-10-05 NOTE — Progress Notes (Signed)
  [facility] Adult Case Management Discharge Plan :  Will you be returning to the same living situation after discharge:  Yes,  with Grandmother At discharge, do you have transportation home?: Yes,  mother will transport Do you have the ability to pay for your medications: Yes,  has insurance  Release of information consent forms completed and in the chart;  Patient's signature needed at discharge.  Patient to Follow up at:  Follow-up Information     [facility]. Go on 10/13/2023.   Why: You have a hospital follow up appointment on 10/13/23 at 10:00 am. The appointment will be held in person. Following this appointment, you will be scheduled for a clinical assessment, to obtain therapy and medication management services.  IF YOUR SERVICES WITH ACTT RESUME AS PLANNED, YOU DO NOT NEED TO KEEP THIS APPOINTMENT. Contact information: [address] [place] [address] [phone]         [facility] [place] [employer].. Call on 10/06/2023.   Why: Per mother, your ACTT team will see you on Monday 12/2. Contact information: [address] [place] [address] [phone]                 Next level of care provider has access to [facility] Link:no  Safety Planning and Suicide Prevention discussed: Utsav, Kaehler [phone] Grandmother and Mother Sonnie Alamo [phone]     Has patient been referred to the Quitline?: Patient refused referral for treatment  Patient has been referred for addiction treatment: Patient refused referral for treatment.  Marinda Elk, LCSW 10/05/2023, 8:56 AM

## 2023-10-05 NOTE — Progress Notes (Signed)
Pt discharged to lobby.Mother present to pick up patient. Pt was stable and appreciative at that time. All papers, samples,and prescriptions were given and valuables returned. Suicide safety plan completed Verbal understanding expressed. Denies SI/HI and A/VH. Pt given opportunity to express concerns and ask questions.

## 2023-10-05 NOTE — Discharge Summary (Signed)
Physician Discharge Summary Note  Patient:  Leonard Davidson is an 27 y.o., male MRN:  [id number] DOB:  Dec 15, 1995 Patient phone:  [phone] (home)  Patient address:   [address] [place] [address],  Total Time spent with patient: 20 minutes  Date of Admission:  09/28/2023 Date of Discharge: 10/05/2023  Reason for Admission:   The patient is a 27 year old male who reports that he was having a breakdown.  He was seen on telepsychiatry and at that time he was calm and cooperative and denied all symptoms but admitted to having a nervous breakdown.  He reports that he was under a lot of stress.  And according to the father he was having active hallucinations and saw a cat currently human being.  He was quite overwhelmed.  Urine tox screen was negative for substances of abuse. According to the collateral information obtained prior to admission the patient's mother reported that he has not been himself for the past 2 weeks.  She reported that he was making good use of her and his father living together and they have been divorced for a long time and she is remarried.  Patient is also fixated on religion and Bible and believes that he is seeing spirits.  He has also been making videos or the Bible and taking pictures of the Bible version.  He has been decompensating gradually over the last several weeks. He has not had Invega for several months.   Patient does have a prior history of hospitalization in 2017 and upon discharge was being followed by the ACT team.  Patient reports that he stopped all his medications several months ago.  He now lives with his grandmother and reports that his parents are divorced.  He has a brother who is supportive.  Patient reports that he wants to be his own boss and he wants to be Database administrator.  When asked what he wanted to do, he states that he wanted to do everything.  He wanted to make movies, he wants to make videos and he wants to go to  school and he wants to have multiple jobs.  He is currently unemployed and is not earning but states that he has aspirations to be his own boss.  His grandmother is very supportive.   When seen today the patient was noted to be a tall lanky 27 year old male who maintained fair eye contact.  He was quite hypervigilant and guarded.  He states that he came here because he was overwhelmed and stressed out and had a nervous breakdown.  However he is unable to describe his symptoms.  He states that he is not hallucinating and is not delusional and he reports that he is not having any suicidal or homicidal thoughts.  He is contracting for safety and is willing to seek help.  Principal Problem: Schizophrenia, acute undifferentiated (HCC) Discharge Diagnoses: Principal Problem:   Schizophrenia, acute undifferentiated (HCC)   Past Psychiatric History: Bipolar Disorder and THC Abuse, and 3 Prior Psychiatric Hospitalizations (last [facility] 08/2016), and no history of Suicide Attempts or Self Injurious Behavior.   Past Medical History:  Past Medical History:  Diagnosis Date   Bipolar 1 disorder (HCC)    Seasonal allergies     Past Surgical History:  Procedure Laterality Date   NO PAST SURGERIES     Family History:  Family History  Problem Relation Age of Onset   Diabetes Father    Family Psychiatric  History:  Grandfather- Bipolar Disorder  Social History:  Social History   Substance and Sexual Activity  Alcohol Use Yes   Comment: sometimes     Social History   Substance and Sexual Activity  Drug Use Not Currently    Social History   Socioeconomic History   Marital status: Single    Spouse name: Not on file   Number of children: Not on file   Years of education: Not on file   Highest education level: Not on file  Occupational History   Not on file  Tobacco Use   Smoking status: Never   Smokeless tobacco: Never  Vaping Use   Vaping status: Never Used  Substance and Sexual  Activity   Alcohol use: Yes    Comment: sometimes   Drug use: Not Currently   Sexual activity: Not on file  Other Topics Concern   Not on file  Social History Narrative   Not on file   Social Determinants of Health   Financial Resource Strain: Not on file  Food Insecurity: No Food Insecurity (09/28/2023)   Hunger Vital Sign    Worried About Running Out of Food in the Last Year: Never true    Ran Out of Food in the Last Year: Never true  Transportation Needs: No Transportation Needs (09/28/2023)   PRAPARE - Administrator, Civil Service (Medical): No    Lack of Transportation (Non-Medical): No  Physical Activity: Not on file  Stress: Not on file  Social Connections: Not on file    Hospital Course:   During the patient's hospitalization, patient had extensive initial psychiatric evaluation, and follow-up psychiatric evaluations every day.  Psychiatric diagnoses provided upon initial assessment: Schizophrenia   Patient's psychiatric medications were adjusted on admission:  Restarted Invega.   During the hospitalization, other adjustments were made to the patient's psychiatric medication regimen: He was titrated on Invega.  He was offered [place] multiple times but he declined and wanted to continue taking oral Invega.  He was also started on Depakote for additional mood stability.   Patient's care was discussed during the interdisciplinary team meeting every day during the hospitalization.  The patient is not having side effects to prescribed psychiatric medication.  Gradually, patient started adjusting to milieu. The patient was evaluated each day by a clinical provider to ascertain response to treatment. Improvement was noted by the patient's report of decreasing symptoms, improved sleep and appetite, affect, medication tolerance, behavior, and participation in unit programming.  Patient was asked each day to complete a self inventory noting mood, mental  status, pain, new symptoms, anxiety and concerns.   Symptoms were reported as significantly decreased or resolved completely by discharge.  The patient reports that their mood is stable.  The patient denied having suicidal thoughts for more than 48 hours prior to discharge.  Patient denies having homicidal thoughts.  Patient denies having auditory hallucinations.  Patient denies any visual hallucinations or other symptoms of psychosis.  The patient was motivated to continue taking medication with a goal of continued improvement in mental health.   The patient reports their target psychiatric symptoms of visual hallucinations and hyper-religiosity  responded well to the psychiatric medications, and the patient reports overall benefit other psychiatric hospitalization. Supportive psychotherapy was provided to the patient. The patient also participated in regular group therapy while hospitalized. Coping skills, problem solving as well as relaxation therapies were also part of the unit programming.  Labs were reviewed with the patient, and abnormal results were discussed with the  patient.  The patient is able to verbalize their individual safety plan to this provider.  # It is recommended to the patient to continue psychiatric medications as prescribed, after discharge from the hospital.    # It is recommended to the patient to follow up with your outpatient psychiatric provider and PCP.  # It was discussed with the patient, the impact of alcohol, drugs, tobacco have been there overall psychiatric and medical wellbeing, and total abstinence from substance use was recommended the patient.ed.  # Prescriptions provided or sent directly to preferred pharmacy at discharge. Patient agreeable to plan. Given opportunity to ask questions. Appears to feel comfortable with discharge.    # In the event of worsening symptoms, the patient is instructed to call the crisis hotline, 911 and or go to the nearest ED  for appropriate evaluation and treatment of symptoms. To follow-up with primary care provider for other medical issues, concerns and or health care needs  # Patient was discharged home with a plan to follow up as noted below.    On day of discharge he reports that he is doing good and excited to go home.  He reports no side effects to his medications.  Discussed with him that his lab work was reassuring.  Stressed to him the importance of continuing to take his medications as prescribed until his outpatient follow-up appointment and he reported agreement.  He reports his sleep was good last night.  He reports appetite is doing good.  He reports no SI, HI, or AVH.  He reports no other concerns at present.  He was discharged home with his mother.   Physical Findings: AIMS: Facial and Oral Movements Muscles of Facial Expression: None Lips and Perioral Area: None Jaw: None Tongue: None,Extremity Movements Upper (arms, wrists, hands, fingers): None Lower (legs, knees, ankles, toes): None, Trunk Movements Neck, shoulders, hips: None, Global Judgements Severity of abnormal movements overall : None Incapacitation due to abnormal movements: None Patient's awareness of abnormal movements: No Awareness, Dental Status Current problems with teeth and/or dentures?: No Does patient usually wear dentures?: No [place]?: No  CIWA:    COWS:     Musculoskeletal: Strength & Muscle Tone: within normal limits Gait & Station: normal Patient leans: N/A   Psychiatric Specialty Exam:  Presentation  General Appearance:  Appropriate for Environment; Casual  Eye Contact: Good  Speech: Clear and Coherent; Normal Rate  Speech Volume: Normal  Handedness: Right   Mood and Affect  Mood: Euthymic  Affect: Congruent; Appropriate   Thought Process  Thought Processes: Coherent; Goal Directed  Descriptions of Associations:Intact  Orientation:Full (Time, Place and Person)  Thought  Content:Logical; WDL  History of Schizophrenia/Schizoaffective disorder:Yes  Duration of Psychotic Symptoms:Greater than six months  Hallucinations:Hallucinations: None  Ideas of Reference:None  Suicidal Thoughts:Suicidal Thoughts: No  Homicidal Thoughts:Homicidal Thoughts: No   Sensorium  Memory: Immediate Fair; Recent Fair  Judgment: Fair  Insight: Fair   Art therapist  Concentration: Good  Attention Span: Good  Recall: Good  Fund of Knowledge: Good  Language: Good   Psychomotor Activity  Psychomotor Activity: Psychomotor Activity: Normal   Assets  Assets: Communication Skills; Desire for Improvement; Resilience; Physical Health   Sleep  Sleep: Sleep: Good Number of Hours of Sleep: 8    Physical Exam: Physical Exam Vitals and nursing note reviewed.  Constitutional:      General: He is not in acute distress.    Appearance: Normal appearance. He is normal weight. He is not ill-appearing or  toxic-appearing.  HENT:     Head: Normocephalic and atraumatic.  Pulmonary:     Effort: Pulmonary effort is normal.  Musculoskeletal:        General: Normal range of motion.  Neurological:     General: No focal deficit present.     Mental Status: He is alert.    Review of Systems  Respiratory:  Negative for cough and shortness of breath.   Cardiovascular:  Negative for chest pain.  Gastrointestinal:  Negative for abdominal pain, constipation, diarrhea, nausea and vomiting.  Neurological:  Negative for dizziness, weakness and headaches.  Psychiatric/Behavioral:  Negative for depression, hallucinations and suicidal ideas. The patient is not nervous/anxious.    Blood pressure 122/68, pulse 69, temperature 97.6 F (36.4 C), temperature source Oral, resp. rate 18, height 6\' 3"  (1.905 m), weight 74.4 kg, SpO2 100%. Body mass index is 20.5 kg/m.   Social History   Tobacco Use  Smoking Status Never  Smokeless Tobacco Never   Tobacco  Cessation:  N/A, patient does not currently use tobacco products   Blood Alcohol level:  Lab Results  Component Value Date   ETH <10 09/27/2023   ETH <5 09/02/2016    Metabolic Disorder Labs:  Lab Results  Component Value Date   HGBA1C 5.3 08/17/2016   MPG 105 08/17/2016   No results found for: "PROLACTIN" Lab Results  Component Value Date   CHOL 160 07/07/2020   TRIG 74 07/07/2020   HDL 27 (L) 07/07/2020   CHOLHDL 3.7 08/17/2016   VLDL 11 08/17/2016   LDLCALC 119 (H) 07/07/2020   LDLCALC 118 (H) 06/25/2018    See Psychiatric Specialty Exam and Suicide Risk Assessment completed by Attending Physician prior to discharge.  Discharge destination:  Home  Is patient on multiple antipsychotic therapies at discharge:  No   Has Patient had three or more failed trials of antipsychotic monotherapy by history:  No  Recommended Plan for Multiple Antipsychotic Therapies: NA   Allergies as of 10/05/2023   Not on File      Medication List     TAKE these medications      Indication  paliperidone 9 MG 24 hr tablet Commonly known as: INVEGA Take 1 tablet (9 mg total) by mouth daily. Start taking on: October 06, 2023  Indication: Schizophrenia   valproic acid 250 MG/5ML solution Commonly known as: DEPAKENE Take 15 mLs (750 mg total) by mouth daily. Start taking on: October 06, 2023  Indication: Schizophrenia        Follow-up Information     [facility]. Go on 10/13/2023.   Why: You have a hospital follow up appointment on 10/13/23 at 10:00 am. The appointment will be held in person. Following this appointment, you will be scheduled for a clinical assessment, to obtain therapy and medication management services.  IF YOUR SERVICES WITH ACTT RESUME AS PLANNED, YOU DO NOT NEED TO KEEP THIS APPOINTMENT. Contact information: [address] [place] [address] [phone]         [facility] [place], [employer].. Call on  10/06/2023.   Why: Per mother, your ACTT team will see you on Monday 12/2. Contact information: [address] [place] [address] [phone]                 Follow-up recommendations/Comments:   Activity: as tolerated   Diet: heart healthy   Other: -Follow-up with your outpatient psychiatric provider -instructions on appointment date, time, and  address (location) are provided to you in discharge paperwork.   -Take your psychiatric medications as prescribed at discharge - instructions are provided to you in the discharge paperwork   -Follow-up with outpatient primary care doctor and other specialists -for management of chronic medical disease, including: None   -Testing: Follow-up with outpatient provider for abnormal lab results: None   -Recommend abstinence from alcohol, tobacco, and other illicit drug use at discharge.    -If your psychiatric symptoms recur, worsen, or if you have side effects to your psychiatric medications, call your outpatient psychiatric provider, 911, [phone] or go to the nearest emergency department.   -If suicidal thoughts recur, call your outpatient psychiatric provider, 911, [phone] or go to the nearest emergency department.   Signed: Lauro Franklin, MD 10/05/2023, 10:09 AM

## 2023-10-05 NOTE — Plan of Care (Signed)
  Problem: Education: Goal: Emotional status will improve Outcome: Progressing Goal: Mental status will improve Outcome: Progressing   Problem: Activity: Goal: Sleeping patterns will improve Outcome: Progressing   Problem: Education: Goal: Emotional status will improve Outcome: Progressing Goal: Mental status will improve Outcome: Progressing   Problem: Activity: Goal: Sleeping patterns will improve Outcome: Progressing

## 2023-10-05 NOTE — [facility] Group Notes (Signed)
[facility] Group Notes:  (Nursing/MHT/Case Management/Adjunct)  Date:  10/05/2023  Time:  9:01 AM  Type of Therapy:   Goals  Participation Level:  Active  Participation Quality:  Appropriate  Affect:  Appropriate  Cognitive:  Appropriate  Insight:  Appropriate  Engagement in Group:  Engaged  Modes of Intervention:  Discussion and Orientation  Summary of Progress/Problems: Goal is to get better  Leonard Davidson 10/05/2023, 9:01 AM

## 2023-11-26 ENCOUNTER — Ambulatory Visit: Payer: MEDICAID | Admitting: Nurse Practitioner

## 2023-11-27 ENCOUNTER — Ambulatory Visit: Payer: MEDICAID | Admitting: Nurse Practitioner

## 2024-03-18 ENCOUNTER — Encounter: Payer: Self-pay | Admitting: Nurse Practitioner

## 2024-03-18 ENCOUNTER — Ambulatory Visit: Payer: MEDICAID | Admitting: Nurse Practitioner

## 2024-03-18 VITALS — BP 120/80 | HR 63 | Temp 98.4°F | Resp 16 | Ht 73.0 in | Wt 166.4 lb

## 2024-03-18 DIAGNOSIS — J342 Deviated nasal septum: Secondary | ICD-10-CM | POA: Diagnosis not present

## 2024-03-18 DIAGNOSIS — J349 Unspecified disorder of nose and nasal sinuses: Secondary | ICD-10-CM

## 2024-03-18 NOTE — Progress Notes (Signed)
 [facility] [address] [place], [place] [address]  Internal MEDICINE  Office Visit Note  Patient Name: Leonard Davidson  [id number]  [id number]  Date of Service: 03/18/2024  Chief Complaint  Patient presents with   Acute Visit     HPI Muhammadali presents for an acute sick visit for chronic sinus issues  Has had chronic problems with his sinuses.  Back in 2021 had a CT maxillofacial done and this showed significant abnormalities that would require surgery. He is in need of ENT referral to get this process moving forward.  On the prior CT, chronic paranasal and sinus disease was noted and a deviated septum.     Current Medication:  Outpatient Encounter Medications as of 03/18/2024  Medication Sig   paliperidone  (INVEGA ) 9 MG 24 hr tablet Take 1 tablet (9 mg total) by mouth daily.   valproic  acid (DEPAKENE ) 250 MG/5ML solution Take 15 mLs (750 mg total) by mouth daily.   No facility-administered encounter medications on file as of 03/18/2024.      Medical History: Past Medical History:  Diagnosis Date   Bipolar 1 disorder (HCC)    Seasonal allergies      Vital Signs: BP 120/80   Pulse 63   Temp 98.4 F (36.9 C)   Resp 16   Ht 6' 1 (1.854 m)   Wt 166 lb 6.4 oz (75.5 kg)   SpO2 95%   BMI 21.95 kg/m    Review of Systems  Constitutional:  Negative for appetite change, chills, fatigue and fever.  HENT:  Positive for congestion, ear pain, postnasal drip, rhinorrhea, sinus pressure, sinus pain and sneezing. Negative for sore throat.   Respiratory:  Positive for cough and wheezing. Negative for chest tightness and shortness of breath.   Cardiovascular: Negative.  Negative for chest pain and palpitations.  Gastrointestinal:  Negative for diarrhea, nausea and vomiting.  Musculoskeletal:  Negative for myalgias.  Skin:  Negative for rash.  Neurological:  Negative for dizziness and headaches.    Physical Exam Vitals reviewed.  Constitutional:       General: He is not in acute distress.    Appearance: Normal appearance. He is normal weight. He is not ill-appearing.  HENT:     Head: Normocephalic and atraumatic.     Right Ear: Tympanic membrane, ear canal and external ear normal.     Left Ear: Tympanic membrane, ear canal and external ear normal.     Nose: Congestion and rhinorrhea present.     Mouth/Throat:     Mouth: Mucous membranes are moist.     Pharynx: Posterior oropharyngeal erythema present.   Eyes:     Pupils: Pupils are equal, round, and reactive to light.    Cardiovascular:     Rate and Rhythm: Normal rate and regular rhythm.     Heart sounds: Normal heart sounds. No murmur heard. Pulmonary:     Effort: Pulmonary effort is normal. No respiratory distress.     Breath sounds: Normal breath sounds.   Neurological:     Mental Status: He is alert and oriented to person, place, and time.   Psychiatric:        Mood and Affect: Mood normal.        Behavior: Behavior normal.       Assessment/Plan: 1. Paranasal sinus disease (Primary) Referred to ENT  - Ambulatory referral to ENT  2. Deviated nasal septum Referred to ENT - Ambulatory referral to ENT   General Counseling: Hugh Madura  understanding of the findings of todays visit and agrees with plan of treatment. I have discussed any further diagnostic evaluation that may be needed or ordered today. We also reviewed his medications today. he has been encouraged to call the office with any questions or concerns that should arise related to todays visit.    Counseling:    Orders Placed This Encounter  Procedures   Ambulatory referral to ENT    No orders of the defined types were placed in this encounter.   Return if symptoms worsen or fail to improve, for referred to ENT.  [place] Controlled Substance Database was reviewed by me for overdose risk score (ORS)  Time spent:30 Minutes Time spent with patient included reviewing progress notes, labs,  imaging studies, and discussing plan for follow up.   This patient was seen by Laurence Pons, FNP-C in collaboration with Dr. Verneta Gone as a part of collaborative care agreement.  Cass Vandermeulen R. Bobbi Burow, MSN, FNP-C Internal Medicine

## 2024-03-22 ENCOUNTER — Telehealth: Payer: Self-pay | Admitting: Nurse Practitioner

## 2024-03-22 NOTE — Telephone Encounter (Signed)
 Awaiting 03/18/24 office notes for Otolaryngology referral-Toni

## 2024-03-24 ENCOUNTER — Ambulatory Visit
Admission: RE | Admit: 2024-03-24 | Discharge: 2024-03-24 | Disposition: A | Discharge status: Home / Self Care | Payer: MEDICAID | Source: Ambulatory Visit | Attending: Emergency Medicine | Admitting: Emergency Medicine

## 2024-03-24 VITALS — BP 118/69 | HR 56 | Temp 98.3°F | Resp 16

## 2024-03-24 DIAGNOSIS — R300 Dysuria: Secondary | ICD-10-CM | POA: Insufficient documentation

## 2024-03-24 LAB — URINALYSIS, W/ REFLEX TO CULTURE (INFECTION SUSPECTED)
Bilirubin Urine: NEGATIVE
Glucose, UA: NEGATIVE mg/dL
Hgb urine dipstick: NEGATIVE
Leukocytes,Ua: NEGATIVE
Nitrite: NEGATIVE
Specific Gravity, Urine: 1.02 (ref 1.005–1.030)
Squamous Epithelial / HPF: NONE SEEN /HPF (ref 0–5)
pH: 7.5 (ref 5.0–8.0)

## 2024-03-24 MED ORDER — PHENAZOPYRIDINE HCL 200 MG PO TABS: 200.0000 mg | ORAL_TABLET | Freq: Three times a day (TID) | ORAL | 0 refills | Status: DC

## 2024-03-24 NOTE — ED Provider Notes (Addendum)
 [facility]    CSN: [id number] Arrival date & time: 03/24/24  1256      History   Chief Complaint Chief Complaint  Patient presents with   Groin Pain    HPI Leonard Davidson is a 28 y.o. male.   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  He states that he has had urinary urgency but no frequency.  He also denies fever, blood in his urine, cloudiness to his urine, penile discharge, or testicular pain.  He is sexually active with a monogamous partner and does not always use protection.  He is concerned about possible STIs.  No history of urinary tract infections.  Past Medical History:  Diagnosis Date   Bipolar 1 disorder (HCC)    Seasonal allergies     Patient Active Problem List   Diagnosis Date Noted   Schizophrenia, acute undifferentiated (HCC) 09/28/2023   Schizophrenia, chronic with acute exacerbation (HCC) 09/27/2023   Encounter for general adult medical examination with abnormal findings 07/05/2020   Acute upper respiratory infection 07/05/2020   Dysuria 07/05/2020   Noncompliance 09/02/2016   Cannabis use disorder, severe, dependence (HCC) 08/16/2016   Bipolar disorder, current episode manic severe with psychotic features (HCC) 08/16/2016    Past Surgical History:  Procedure Laterality Date   NO PAST SURGERIES         Home Medications    Prior to Admission medications   Medication Sig Start Date End Date Taking? Authorizing Provider  phenazopyridine (PYRIDIUM) 200 MG tablet Take 1 tablet (200 mg total) by mouth 3 (three) times daily. 03/24/24  Yes Kent Pear, NP  paliperidone  (INVEGA ) 9 MG 24 hr tablet Take 1 tablet (9 mg total) by mouth daily. 10/06/23   Basilia Bosworth, DO  valproic  acid (DEPAKENE ) 250  MG/5ML solution Take 15 mLs (750 mg total) by mouth daily. 10/06/23   Basilia Bosworth, DO    Family History Family History  Problem Relation Age of Onset   Diabetes Father     Social History Social History   Tobacco Use   Smoking status: Never   Smokeless tobacco: Never  Vaping Use   Vaping status: Never Used  Substance Use Topics   Alcohol use: Yes    Comment: sometimes   Drug use: Not Currently     Allergies   Patient has no known allergies.   Review of Systems Review of Systems  Constitutional:  Negative for fever.  Gastrointestinal:  Positive for abdominal pain.       Suprapubic pain  Genitourinary:  Positive for dysuria and urgency. Negative for frequency, hematuria, penile discharge, penile pain, penile swelling, scrotal swelling and testicular pain.  Musculoskeletal:  Negative for back pain.     Physical Exam Triage Vital Signs ED Triage Vitals [03/24/24 1306]  Encounter Vitals Group     BP      Systolic BP Percentile      Diastolic BP Percentile      Pulse      Resp      Temp      Temp src      SpO2      Weight      Height      Head Circumference      Peak Flow      Pain  Score 3     Pain Loc      Pain Education      Exclude from Growth Chart    No data found.  Updated Vital Signs BP 118/69 (BP Location: Right Arm)   Pulse (!) 56   Temp 98.3 F (36.8 C) (Oral)   Resp 16   SpO2 98%   Visual Acuity Right Eye Distance:   Left Eye Distance:   Bilateral Distance:    Right Eye Near:   Left Eye Near:    Bilateral Near:     Physical Exam Vitals and nursing note reviewed.  Constitutional:      Appearance: Normal appearance. He is not ill-appearing.  HENT:     Head: Normocephalic and atraumatic.  Cardiovascular:     Rate and Rhythm: Normal rate and regular rhythm.     Pulses: Normal pulses.     Heart sounds: Normal heart sounds. No murmur heard.    No friction rub. No gallop.  Pulmonary:     Effort: Pulmonary effort is  normal.     Breath sounds: Normal breath sounds. No wheezing, rhonchi or rales.  Abdominal:     General: Abdomen is flat.     Palpations: Abdomen is soft.     Tenderness: There is no abdominal tenderness. There is no right CVA tenderness, left CVA tenderness, guarding or rebound.  Genitourinary:    Penis: Normal.      Testes: Normal.  Skin:    General: Skin is warm and dry.     Capillary Refill: Capillary refill takes less than 2 seconds.     Findings: No rash.  Neurological:     General: No focal deficit present.     Mental Status: He is alert and oriented to person, place, and time.      UC Treatments / Results  Labs (all labs ordered are listed, but only abnormal results are displayed) Labs Reviewed  URINALYSIS, W/ REFLEX TO CULTURE (INFECTION SUSPECTED) - Abnormal; Notable for the following components:      Result Value   Ketones, ur TRACE (*)    Protein, ur TRACE (*)    Bacteria, UA RARE (*)    All other components within normal limits  CYTOLOGY, (ORAL, ANAL, URETHRAL) ANCILLARY ONLY    EKG   Radiology No results found.  Procedures Procedures (including critical care time)  Medications Ordered in UC Medications - No data to display  Initial Impression / Assessment and Plan / UC Course  I have reviewed the triage vital signs and the nursing notes.  Pertinent labs & imaging results that were available during my care of the patient were reviewed by me and considered in my medical decision making (see chart for details).   Patient is a nontoxic-appearing 28 year old male presenting for evaluation of genitourinary symptoms as outlined HPI above.  On exam patient has no rashes or lesions on his glans penis with the shaft of the penis.  No discharge from the urethral meatus.  Bilateral testicles are in normal volume, smooth in texture, and free of tenderness.  Also no tenderness or edema of bilateral epididymis complexes and spermatic cords.  Scrotum does not  demonstrate any erythema, edema, rashes, or lesions.  No hernia present.  The patient reports that he is in a monogamous relationship but he is unsure if his partner is remaining monogamous with him so he is concerned about possible STIs.  I will order a urethral cytology swab to check for the presence of  gonorrhea, chlamydia, or trichomonas.  I will also order a urinalysis to assess for the presence of a UTI.  Analysis shows trace protein and trace ketones but is negative for leukocyte esterase, nitrites, hemoglobin, or glucose.  Reflex microscopy shows 6-10 RBCs, 0-5 WBCs, and rare bacteria.  Urethral cytology swab is pending.  I will discharge patient home with a diagnosis of dysuria with a prescription for Pyridium to help with his symptoms until his STI test is back.  If the patient's STI testing is negative I will refer him to urology for further evaluation and possible pelvic floor physical therapy.  Urethral cytology swab is negative for gonorrhea, chlamydia, or trichomonas.   Final Clinical Impressions(s) / UC Diagnoses   Final diagnoses:  Dysuria     Discharge Instructions      Your urinalysis did not show any evidence of infection though it does show that you need to drink more water because you have some protein and ketones in your urine.  Your STI testing will be back in the next 24 to 48 hours.  If you test positive for any infection you will be contacted by phone and treatment options will be provided.  If your results are negative they will appear in your MyChart.  If your STI testing is negative I will refer you to urology for further evaluation.   ED Prescriptions     Medication Sig Dispense Auth. Provider   phenazopyridine (PYRIDIUM) 200 MG tablet Take 1 tablet (200 mg total) by mouth 3 (three) times daily. 6 tablet Kent Pear, NP      PDMP not reviewed this encounter.   Kent Pear, NP 03/24/24 1406    Kent Pear, NP 03/27/24 [phone]

## 2024-03-24 NOTE — Discharge Instructions (Addendum)
 Your urinalysis did not show any evidence of infection though it does show that you need to drink more water because you have some protein and ketones in your urine.  Your STI testing will be back in the next 24 to 48 hours.  If you test positive for any infection you will be contacted by phone and treatment options will be provided.  If your results are negative they will appear in your MyChart.  If your STI testing is negative I will refer you to urology for further evaluation.

## 2024-03-24 NOTE — ED Triage Notes (Signed)
 Pt c/o groin pain and dysuria for several weeks.

## 2024-03-25 LAB — CYTOLOGY, (ORAL, ANAL, URETHRAL) ANCILLARY ONLY
Chlamydia: NEGATIVE
Comment: NEGATIVE
Comment: NEGATIVE
Comment: NORMAL
Neisseria Gonorrhea: NEGATIVE
Trichomonas: NEGATIVE

## 2024-03-27 ENCOUNTER — Telehealth: Payer: Self-pay | Admitting: Emergency Medicine

## 2024-03-27 NOTE — Telephone Encounter (Signed)
 Patient presented with 1 month worth of dysuria, urgency, and frequency.  Urinalysis did not show any signs of infection and STI panel was negative.  Suspect pelvic floor dysfunction.  Will order referral to physical therapy for pelvic floor PT eval and treatment.

## 2024-04-15 ENCOUNTER — Encounter: Payer: Self-pay | Admitting: Nurse Practitioner

## 2024-04-15 ENCOUNTER — Telehealth: Payer: Self-pay | Admitting: Nurse Practitioner

## 2024-04-15 NOTE — Telephone Encounter (Signed)
 Otolaryngology referral sent via [facility] to [facility].  Lvm notifying patient. Gave pt telephone# [phone]-[place]oni

## 2024-04-16 ENCOUNTER — Telehealth: Payer: Self-pay | Admitting: Nurse Practitioner

## 2024-04-16 NOTE — Telephone Encounter (Signed)
 Otolaryngology appointment 04/05/2024 @ [facility]-[place]

## 2024-06-23 ENCOUNTER — Other Ambulatory Visit: Payer: Self-pay | Admitting: Nurse Practitioner

## 2024-06-23 DIAGNOSIS — J300 Vasomotor rhinitis: Secondary | ICD-10-CM

## 2024-06-23 DIAGNOSIS — J320 Chronic maxillary sinusitis: Secondary | ICD-10-CM

## 2024-11-21 ENCOUNTER — Other Ambulatory Visit: Payer: Self-pay

## 2024-11-21 ENCOUNTER — Emergency Department: Admission: EM | Admit: 2024-11-21 | Discharge: 2024-11-23 | Disposition: A | Payer: MEDICAID

## 2024-11-21 DIAGNOSIS — F209 Schizophrenia, unspecified: Secondary | ICD-10-CM | POA: Insufficient documentation

## 2024-11-21 DIAGNOSIS — R443 Hallucinations, unspecified: Secondary | ICD-10-CM | POA: Diagnosis present

## 2024-11-21 DIAGNOSIS — F29 Unspecified psychosis not due to a substance or known physiological condition: Secondary | ICD-10-CM | POA: Insufficient documentation

## 2024-11-21 LAB — CBC
HCT: 41.9 % (ref 39.0–52.0)
Hemoglobin: 14.1 g/dL (ref 13.0–17.0)
MCH: 30.7 pg (ref 26.0–34.0)
MCHC: 33.7 g/dL (ref 30.0–36.0)
MCV: 91.1 fL (ref 80.0–100.0)
Platelets: 204 K/uL (ref 150–400)
RBC: 4.6 MIL/uL (ref 4.22–5.81)
RDW: 12.1 % (ref 11.5–15.5)
WBC: 5.8 K/uL (ref 4.0–10.5)
nRBC: 0 % (ref 0.0–0.2)

## 2024-11-21 LAB — COMPREHENSIVE METABOLIC PANEL WITH GFR
ALT: 15 U/L (ref 0–44)
AST: 23 U/L (ref 15–41)
Albumin: 4.7 g/dL (ref 3.5–5.0)
Alkaline Phosphatase: 101 U/L (ref 38–126)
Anion gap: 11 (ref 5–15)
BUN: 13 mg/dL (ref 6–20)
CO2: 26 mmol/L (ref 22–32)
Calcium: 9.2 mg/dL (ref 8.9–10.3)
Chloride: 103 mmol/L (ref 98–111)
Creatinine, Ser: 0.86 mg/dL (ref 0.61–1.24)
GFR, Estimated: >60 mL/min
Glucose, Bld: 106 mg/dL — ABNORMAL HIGH (ref 70–99)
Potassium: 4.9 mmol/L (ref 3.5–5.1)
Sodium: 140 mmol/L (ref 135–145)
Total Bilirubin: 0.3 mg/dL (ref 0.0–1.2)
Total Protein: 6.9 g/dL (ref 6.5–8.1)

## 2024-11-21 LAB — URINE DRUG SCREEN
Amphetamines: NEGATIVE
Barbiturates: NEGATIVE
Benzodiazepines: NEGATIVE
Cocaine: NEGATIVE
Fentanyl: NEGATIVE
Methadone Scn, Ur: NEGATIVE
Opiates: NEGATIVE
Tetrahydrocannabinol: NEGATIVE

## 2024-11-21 LAB — SALICYLATE LEVEL: Salicylate Lvl: <7 mg/dL — ABNORMAL LOW (ref 7.0–30.0)

## 2024-11-21 LAB — ACETAMINOPHEN LEVEL: Acetaminophen (Tylenol), Serum: <10 ug/mL — ABNORMAL LOW (ref 10–30)
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
## 2024-11-21 NOTE — ED Notes (Signed)
 Obtained UA and sent for testing. Pt notes that he urinated on the floor in his room while trying to provide a urine sample. Floor cleaned up, and wet wipes provided for pt clean up.

## 2024-11-21 NOTE — ED Notes (Signed)
 Pts belongings:  Chief operating officer [employer]
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
## 2024-11-22 LAB — ETHANOL: Alcohol, Ethyl (B): <15 mg/dL

## 2024-11-22 MED ORDER — VALPROIC ACID 250 MG/5ML PO SOLN
750.0000 mg | Freq: Every day | ORAL | Status: DC
  Administered 2024-11-22 – 2024-11-23 (×2): 750 mg via ORAL
  Filled 2024-11-22 (×2): qty 15

## 2024-11-22 MED ORDER — TRAZODONE HCL 50 MG PO TABS: 50.0000 mg | ORAL_TABLET | Freq: Every day | ORAL | Status: DC

## 2024-11-22 MED ORDER — PALIPERIDONE ER 3 MG PO TB24
9.0000 mg | ORAL_TABLET | Freq: Every day | ORAL | Status: DC
  Administered 2024-11-22 – 2024-11-23 (×2): 9 mg via ORAL
  Filled 2024-11-22 (×2): qty 3

## 2024-11-22 NOTE — ED Notes (Signed)
IVC/pending psych inpatient admission 

## 2024-11-22 NOTE — ED Notes (Signed)
 IVC  GOING TO [facility] ON 11/23/24

## 2024-11-22 NOTE — ED Notes (Signed)
 Patient sleeping

## 2024-11-22 NOTE — ED Notes (Signed)
 Pt refusing scheduled meds at this time. This RN encouraged pt to take meds due to presentation complicated for med noncompliance. Pt verbalized understanding and continues to refuse. When asked why he was refusing pt shrugged. No additional needs at this time. Resting in bed with RR even and unlabored.
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
## 2024-11-22 NOTE — ED Notes (Signed)
 Cup of water was given

## 2024-11-22 NOTE — ED Notes (Signed)
"  Pt given meal tray.   "

## 2024-11-22 NOTE — ED Notes (Signed)
 Patient refused breakfast

## 2024-11-22 NOTE — BH Assessment (Signed)
 Patient has been accepted to [facility] 11/23/2024  Patient assigned to room 504-2  Accepting is Dr. Ruther   Call report to [phone].   Representative was [facility] Danika.

## 2024-11-22 NOTE — ED Notes (Signed)
TTS in speaking with patient. 

## 2024-11-22 NOTE — ED Notes (Signed)
 Meal provided

## 2024-11-22 NOTE — ED Provider Notes (Signed)
 BP 135/79   Pulse 92   Temp 98.1 F (36.7 C)   Resp 18   Ht 5' 10 (1.778 m)   Wt 61.2 kg   SpO2 98%   BMI 19.37 kg/m   Patient remains on IVC awaiting psychiatric placement.  No reported acute events on prior shift and at time of writing on my shift.   Clarine Ozell LABOR, MD 11/22/24 [phone]
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
## 2024-11-22 NOTE — ED Notes (Signed)
 Pt given snack.

## 2024-11-23 ENCOUNTER — Inpatient Hospital Stay (HOSPITAL_COMMUNITY)
Admission: AD | Admit: 2024-11-23 | Discharge: 2024-11-29 | DRG: 885 | Disposition: A | Discharge status: Home / Self Care | Payer: MEDICAID | Source: Intra-hospital

## 2024-11-23 ENCOUNTER — Encounter (HOSPITAL_COMMUNITY): Payer: Self-pay | Admitting: Child & Adolescent Psychiatry

## 2024-11-23 ENCOUNTER — Other Ambulatory Visit: Payer: Self-pay

## 2024-11-23 DIAGNOSIS — Z833 Family history of diabetes mellitus: Secondary | ICD-10-CM

## 2024-11-23 DIAGNOSIS — E559 Vitamin D deficiency, unspecified: Secondary | ICD-10-CM | POA: Diagnosis present

## 2024-11-23 DIAGNOSIS — Z91148 Patient's other noncompliance with medication regimen for other reason: Secondary | ICD-10-CM

## 2024-11-23 DIAGNOSIS — F200 Paranoid schizophrenia: Secondary | ICD-10-CM | POA: Diagnosis present

## 2024-11-23 DIAGNOSIS — Z818 Family history of other mental and behavioral disorders: Secondary | ICD-10-CM

## 2024-11-23 DIAGNOSIS — F4329 Adjustment disorder with other symptoms: Secondary | ICD-10-CM | POA: Diagnosis present

## 2024-11-23 DIAGNOSIS — J302 Other seasonal allergic rhinitis: Secondary | ICD-10-CM | POA: Insufficient documentation

## 2024-11-23 DIAGNOSIS — Z79899 Other long term (current) drug therapy: Secondary | ICD-10-CM | POA: Diagnosis not present

## 2024-11-23 DIAGNOSIS — F209 Schizophrenia, unspecified: Secondary | ICD-10-CM | POA: Diagnosis not present

## 2024-11-23 MED ORDER — VALPROIC ACID 250 MG/5ML PO SOLN
750.0000 mg | Freq: Every day | ORAL | Status: DC
  Administered 2024-11-24 – 2024-11-29 (×6): 750 mg via ORAL
  Filled 2024-11-23 (×12): qty 15

## 2024-11-23 MED ORDER — HALOPERIDOL 5 MG PO TABS: 5.0000 mg | ORAL_TABLET | Freq: Three times a day (TID) | ORAL | Status: DC | PRN

## 2024-11-23 MED ORDER — ACETAMINOPHEN 325 MG PO TABS: 650.0000 mg | ORAL_TABLET | Freq: Four times a day (QID) | ORAL | Status: DC | PRN

## 2024-11-23 MED ORDER — TRAZODONE HCL 50 MG PO TABS
50.0000 mg | ORAL_TABLET | Freq: Every day | ORAL | Status: DC
  Administered 2024-11-23 – 2024-11-28 (×6): 50 mg via ORAL
  Filled 2024-11-23 (×6): qty 1

## 2024-11-23 MED ORDER — PALIPERIDONE ER 6 MG PO TB24
9.0000 mg | ORAL_TABLET | Freq: Every day | ORAL | Status: DC
  Administered 2024-11-24 – 2024-11-29 (×6): 9 mg via ORAL
  Filled 2024-11-23 (×6): qty 1

## 2024-11-23 MED ORDER — DIPHENHYDRAMINE HCL 25 MG PO CAPS: 50.0000 mg | ORAL_CAPSULE | Freq: Three times a day (TID) | ORAL | Status: DC | PRN

## 2024-11-23 MED ORDER — ALUM & MAG HYDROXIDE-SIMETH 200-200-20 MG/5ML PO SUSP: 30.0000 mL | ORAL | Status: DC | PRN

## 2024-11-23 MED ORDER — MAGNESIUM HYDROXIDE 400 MG/5ML PO SUSP: 30.0000 mL | Freq: Every day | ORAL | Status: DC | PRN

## 2024-11-23 NOTE — Tx Team (Signed)
 Initial Treatment Plan 11/23/2024 6:32 PM [name] FMW:[id number]    PATIENT STRESSORS: Other: People doing the most     PATIENT STRENGTHS: Communication skills    PATIENT IDENTIFIED PROBLEMS:                      DISCHARGE CRITERIA: UTA  PRELIMINARY DISCHARGE PLAN: UTA  PATIENT/FAMILY INVOLVEMENT: This treatment plan has been presented to and reviewed with the patient, Leonard Davidson.  The patient and family have been given the opportunity to ask questions and make suggestions.  Elouise Wolm Morel, RN 11/23/2024, 6:32 PM

## 2024-11-23 NOTE — ED Notes (Signed)
Breakfast tray and water provided

## 2024-11-23 NOTE — ED Notes (Addendum)
 Pt given shower supplies and a clean pair of burgundy scrubs per his request. Escorted to shower room without issue.

## 2024-11-23 NOTE — Group Note (Signed)
 Date:  11/23/2024 Time:  8:40 PM  Group Topic/Focus:  Wrap-Up Group:   The focus of this group is to help patients review their daily goal of treatment and discuss progress on daily workbooks.    Participation Level:  Active  Participation Quality:  Appropriate and Sharing  Affect:  Appropriate and Flat  Cognitive:  Appropriate  Insight: Appropriate  Engagement in Group:  Engaged  Modes of Intervention:  Discussion and Socialization  Additional Comments:  Patients shared one high point and one low point from their day. Patient high point, god waking me up this morning. Patient did not have a low point. Patient rated his day a 10 out of 10. Patient stated that while here he would like to work on, getting to know everyone and getting the help I need.   Eward Mace 11/23/2024, 8:40 PM

## 2024-11-23 NOTE — ED Notes (Signed)
 Pt safe for transfer at this time with [facility]. Belongings given to [facility]. Pt ambulatory out of deprtment dressed in hospital scrubs.

## 2024-11-23 NOTE — ED Notes (Signed)
 Lunch tray provided to pt.

## 2024-11-23 NOTE — ED Notes (Signed)
 Pt seen ambulating to the restroom with steady gait. Pt asked this nurse if he was leaving today and what time. Pt reassured that when staff was made aware of a time he would be updated. Pt then asked to use phone. Pt informed of phone use hours and reassured that if he was scheduled to leave prior to that allowed time, he would be given the phone. Pt verbalized understanding.

## 2024-11-23 NOTE — Plan of Care (Signed)
  Problem: Education: Goal: Emotional status will improve Outcome: Progressing Goal: Mental status will improve Outcome: Progressing   Problem: Activity: Goal: Interest or engagement in activities will improve Outcome: Progressing   Problem: Coping: Goal: Ability to demonstrate self-control will improve Outcome: Progressing   Problem: Safety: Goal: Periods of time without injury will increase Outcome: Progressing

## 2024-11-23 NOTE — Progress Notes (Signed)
 Admission: Paperwork received from [facility] and staff walked him to search room. Patient denies SI/HI and any history of past suicide attempts. Denies AVH. Patient reports he lives with his grandmother and does not believe he needs any assistance with medication management. He reports taking Depakote  and Invega  daily. During assessment, patient answered each question the way he felt needed to be answered (masking). Belonging sheet signed and consents signed as well. Patient was escorted to unit and oriented. Patient called his family to bring some clothes.

## 2024-11-23 NOTE — Progress Notes (Signed)
(  Sleep Hours) -5  (Any PRNs that were needed, meds refused, or side effects to meds)- Trazodone  50 mg  (Any disturbances and when (visitation, over night)- pt up @ 4 am , pt back and forth in and out his room, pt stated he wanted to read the Bible , pt messing with the [facility] unit.   (Concerns raised by the patient)- pt remembered writer from previous admission , pt requested a stress ball and clinical research associate gave him one , pt stated he felt he would do better on the other side , clinical research associate educated pt to talk with the doctor. Pt was on the 300 hall his last admission.   (SI/HI/AVH)-denies

## 2024-11-23 NOTE — Plan of Care (Signed)
  Problem: Education: Goal: Emotional status will improve Outcome: Progressing   Problem: Safety: Goal: Periods of time without injury will increase Outcome: Progressing   

## 2024-11-23 NOTE — ED Notes (Signed)
 IVC called for transport to [facility] by [facility] to [facility]  [phone]

## 2024-11-24 ENCOUNTER — Encounter (HOSPITAL_COMMUNITY): Payer: Self-pay

## 2024-11-24 DIAGNOSIS — J302 Other seasonal allergic rhinitis: Secondary | ICD-10-CM | POA: Insufficient documentation

## 2024-11-24 DIAGNOSIS — F200 Paranoid schizophrenia: Principal | ICD-10-CM | POA: Insufficient documentation

## 2024-11-24 MED ORDER — HALOPERIDOL LACTATE 5 MG/ML IJ SOLN: 5.0000 mg | Freq: Three times a day (TID) | INTRAMUSCULAR | Status: DC | PRN

## 2024-11-24 MED ORDER — FLUTICASONE PROPIONATE 50 MCG/ACT NA SUSP: 2.0000 | Freq: Every day | NASAL | Status: DC | PRN

## 2024-11-24 MED ORDER — DIPHENHYDRAMINE HCL 25 MG PO CAPS: 50.0000 mg | ORAL_CAPSULE | Freq: Three times a day (TID) | ORAL | Status: DC | PRN

## 2024-11-24 MED ORDER — HYDROXYZINE HCL 25 MG PO TABS
25.0000 mg | ORAL_TABLET | Freq: Three times a day (TID) | ORAL | Status: DC | PRN
  Administered 2024-11-24 – 2024-11-28 (×5): 25 mg via ORAL
  Filled 2024-11-24 (×5): qty 1

## 2024-11-24 MED ORDER — LORAZEPAM 2 MG/ML IJ SOLN: 2.0000 mg | Freq: Three times a day (TID) | INTRAMUSCULAR | Status: DC | PRN

## 2024-11-24 MED ORDER — DIPHENHYDRAMINE HCL 50 MG/ML IJ SOLN: 50.0000 mg | Freq: Three times a day (TID) | INTRAMUSCULAR | Status: DC | PRN

## 2024-11-24 MED ORDER — HALOPERIDOL LACTATE 5 MG/ML IJ SOLN: 10.0000 mg | Freq: Three times a day (TID) | INTRAMUSCULAR | Status: DC | PRN

## 2024-11-24 MED ORDER — HALOPERIDOL 5 MG PO TABS: 5.0000 mg | ORAL_TABLET | Freq: Three times a day (TID) | ORAL | Status: DC | PRN

## 2024-11-24 NOTE — Plan of Care (Signed)
   Problem: Education: Goal: Emotional status will improve Outcome: Progressing Goal: Mental status will improve Outcome: Progressing   Problem: Activity: Goal: Interest or engagement in activities will improve Outcome: Progressing Goal: Sleeping patterns will improve Outcome: Progressing   Problem: Safety: Goal: Periods of time without injury will increase Outcome: Progressing

## 2024-11-24 NOTE — Group Note (Unsigned)
 Date:  11/24/2024 Time:  2:22 PM  Group Topic/Focus:  Early Warning Signs:   The focus of this group is to help patients identify signs or symptoms they exhibit before slipping into an unhealthy state or crisis. Emotional Education:   The focus of this group is to discuss what feelings/emotions are, and how they are experienced.     Participation Level:  {BHH PARTICIPATION OZCZO:77735}  Participation Quality:  {BHH PARTICIPATION QUALITY:22265}  Affect:  {BHH AFFECT:22266}  Cognitive:  {BHH COGNITIVE:22267}  Insight: {BHH Insight2:20797}  Engagement in Group:  {BHH ENGAGEMENT IN HMNLE:77731}  Modes of Intervention:  {BHH MODES OF INTERVENTION:22269}  Additional Comments:  ***  Leonard Davidson L [name] 11/24/2024, 2:22 PM

## 2024-11-24 NOTE — Group Note (Signed)
 Date:  11/24/2024 Time:  4:25 PM  Group Topic/Focus:  Nurse group     Participation Level:  Active  Participation Quality:  Meg FORBES Ellen 11/24/2024, 4:25 PM

## 2024-11-24 NOTE — Group Note (Signed)
 Date:  11/24/2024 Time:  3:58 PM  Group Topic/Focus:  Mental Health Group     Participation Level:  Active  Participation Quality:  Appropriate  Affect:  Appropriate  Cognitive:  Appropriate  Insight: Appropriate  Engagement in Group:  Engaged  Modes of Intervention:  Education  Additional Comments:  Pt attended group and watch the video and participated   Tryniti Laatsch E Zander Ingham 11/24/2024, 3:58 PM

## 2024-11-24 NOTE — BH IP Treatment Plan (Signed)
 Interdisciplinary Treatment and Diagnostic Plan Update  11/24/2024 Time of Session: 1025AM Leonard Davidson MRN: [id number]  Principal Diagnosis: Adjustment disorder with emotional disturbance  Secondary Diagnoses: Principal Problem:   Adjustment disorder with emotional disturbance   Current Medications:  Current Facility-Administered Medications  Medication Dose Route Frequency Provider Last Rate Last Admin   acetaminophen  (TYLENOL ) tablet 650 mg  650 mg Oral Q6H PRN Madaram, Kondal R, MD       alum & mag hydroxide-simeth (MAALOX/MYLANTA) 200-200-20 MG/5ML suspension 30 mL  30 mL Oral Q4H PRN Madaram, Kondal R, MD       haloperidol  (HALDOL ) tablet 5 mg  5 mg Oral TID PRN Bennett, Christal H, NP       And   diphenhydrAMINE  (BENADRYL ) capsule 50 mg  50 mg Oral TID PRN Bennett, Christal H, NP       haloperidol  lactate (HALDOL ) injection 5 mg  5 mg Intramuscular TID PRN Bennett, Christal H, NP       And   diphenhydrAMINE  (BENADRYL ) injection 50 mg  50 mg Intramuscular TID PRN Bennett, Christal H, NP       And   LORazepam  (ATIVAN ) injection 2 mg  2 mg Intramuscular TID PRN Bennett, Christal H, NP       haloperidol  lactate (HALDOL ) injection 10 mg  10 mg Intramuscular TID PRN Bennett, Christal H, NP       And   diphenhydrAMINE  (BENADRYL ) injection 50 mg  50 mg Intramuscular TID PRN Bennett, Christal H, NP       And   LORazepam  (ATIVAN ) injection 2 mg  2 mg Intramuscular TID PRN Bennett, Christal H, NP       hydrOXYzine  (ATARAX ) tablet 25 mg  25 mg Oral TID PRN Bennett, Christal H, NP       magnesium  hydroxide (MILK OF MAGNESIA) suspension 30 mL  30 mL Oral Daily PRN Madaram, Kondal R, MD       paliperidone  (INVEGA ) 24 hr tablet 9 mg  9 mg Oral Daily Madaram, Kondal R, MD   9 mg at 11/24/24 0819   traZODone  (DESYREL ) tablet 50 mg  50 mg Oral QHS Madaram, Kondal R, MD   50 mg at 11/23/24 2033   valproic  acid (DEPAKENE ) 250 MG/5ML solution 750 mg  750 mg Oral Daily Madaram, Kondal R, MD    750 mg at 11/24/24 9182   PTA Medications: Medications Prior to Admission  Medication Sig Dispense Refill Last Dose/Taking   amoxicillin -clavulanate (AUGMENTIN ) 875-125 MG tablet Take 1 tablet by mouth 2 (two) times daily. (Patient not taking: Reported on 11/22/2024)      divalproex  (DEPAKOTE ) 250 MG DR tablet Take by mouth 2 (two) times daily.      fluticasone  (FLONASE ) 50 MCG/ACT nasal spray Place 2 sprays into both nostrils daily.      methylPREDNISolone  (MEDROL  DOSEPAK) 4 MG TBPK tablet Take 4 mg by mouth 3 (three) times daily. (Patient not taking: Reported on 11/22/2024)      paliperidone  (INVEGA ) 9 MG 24 hr tablet Take 1 tablet (9 mg total) by mouth daily. 30 tablet 0    phenazopyridine  (PYRIDIUM ) 200 MG tablet Take 1 tablet (200 mg total) by mouth 3 (three) times daily. (Patient not taking: Reported on 11/22/2024) 6 tablet 0    valproic  acid (DEPAKENE ) 250 MG/5ML solution Take 15 mLs (750 mg total) by mouth daily. (Patient not taking: Reported on 11/22/2024) 473 mL 0     Patient Stressors: Other: People doing the most  Patient Strengths: Communication skills   Treatment Modalities: Medication Management, Group therapy, Case management,  1 to 1 session with clinician, Psychoeducation, Recreational therapy.   Physician Treatment Plan for Primary Diagnosis: Adjustment disorder with emotional disturbance Long Term Goal(s):     Short Term Goals:    Medication Management: Evaluate patient's response, side effects, and tolerance of medication regimen.  Therapeutic Interventions: 1 to 1 sessions, Unit Group sessions and Medication administration.  Evaluation of Outcomes: Not Progressing  Physician Treatment Plan for Secondary Diagnosis: Principal Problem:   Adjustment disorder with emotional disturbance  Long Term Goal(s):     Short Term Goals:       Medication Management: Evaluate patient's response, side effects, and tolerance of medication regimen.  Therapeutic  Interventions: 1 to 1 sessions, Unit Group sessions and Medication administration.  Evaluation of Outcomes: Not Progressing   RN Treatment Plan for Primary Diagnosis: Adjustment disorder with emotional disturbance Long Term Goal(s): Knowledge of disease and therapeutic regimen to maintain health will improve  Short Term Goals: Ability to remain free from injury will improve, Ability to verbalize frustration and anger appropriately will improve, Ability to demonstrate self-control, Ability to participate in decision making will improve, Ability to verbalize feelings will improve, Ability to disclose and discuss suicidal ideas, Ability to identify and develop effective coping behaviors will improve, and Compliance with prescribed medications will improve  Medication Management: RN will administer medications as ordered by provider, will assess and evaluate patient's response and provide education to patient for prescribed medication. RN will report any adverse and/or side effects to prescribing provider.  Therapeutic Interventions: 1 on 1 counseling sessions, Psychoeducation, Medication administration, Evaluate responses to treatment, Monitor vital signs and CBGs as ordered, Perform/monitor CIWA, COWS, AIMS and Fall Risk screenings as ordered, Perform wound care treatments as ordered.  Evaluation of Outcomes: Not Progressing   LCSW Treatment Plan for Primary Diagnosis: Adjustment disorder with emotional disturbance Long Term Goal(s): Safe transition to appropriate next level of care at discharge, Engage patient in therapeutic group addressing interpersonal concerns.  Short Term Goals: Engage patient in aftercare planning with referrals and resources, Increase social support, Increase ability to appropriately verbalize feelings, Increase emotional regulation, Facilitate acceptance of mental health diagnosis and concerns, Facilitate patient progression through stages of change regarding substance use  diagnoses and concerns, Identify triggers associated with mental health/substance abuse issues, and Increase skills for wellness and recovery  Therapeutic Interventions: Assess for all discharge needs, 1 to 1 time with Social worker, Explore available resources and support systems, Assess for adequacy in community support network, Educate family and significant other(s) on suicide prevention, Complete Psychosocial Assessment, Interpersonal group therapy.  Evaluation of Outcomes: Not Progressing   Progress in Treatment: Attending groups: Yes. Participating in groups: Yes. Taking medication as prescribed: Yes. Toleration medication: Yes. Family/Significant other contact made: No, will contact:  consents pending Patient understands diagnosis: No. Discussing patient identified problems/goals with staff: Yes. Medical problems stabilized or resolved: Yes. Denies suicidal/homicidal ideation: Yes. Issues/concerns per patient self-inventory: No.  New problem(s) identified: No, Describe:  none  New Short Term/Long Term Goal(s): medication stabilization, elimination of SI thoughts, development of comprehensive mental wellness plan.    Patient Goals:  Making connections with others  Discharge Plan or Barriers: Patient recently admitted. CSW will continue to follow and assess for appropriate referrals and possible discharge planning.    Reason for Continuation of Hospitalization: Medication stabilization Suicidal ideation Other; describe paranoia  Estimated Length of Stay: 5-7 days  Last 3 [place]  Suicide Severity Risk Score: Flowsheet Row Ad[facility] (Current) [facility] 11/21/2024 in [facility] from 03/24/2024 in [facility]   C-SSRS RISK CATEGORY No Risk No Risk No Risk    Last PHQ 2/9 Scores:    05/15/2021   10:06 AM 10/17/2020    8:55 AM 06/22/2020   11:02  AM  Depression screen PHQ 2/9  Decreased Interest 0 0 0  Down, Depressed, Hopeless 0 0 0  PHQ - 2 Score 0 0 0    Scribe for Treatment Team: Jenkins LULLA Primer, LCSWA 11/24/2024 1:01 PM

## 2024-11-24 NOTE — Progress Notes (Signed)
 Pt is having a hard time sleeping. Pt is very concern about his AC unit. Pt states the cold air is making him congested. Writer informed pt he could cut the [facility] down or turn on the heat option. Pt stated that would effect his roommate. Pt went back into the room. Pt came out moments later informing staff that he wanted to read the bible but did not want to wake his roommate up from sleeping. Writer told him about his over head light and bathroom light options.

## 2024-11-24 NOTE — H&P (Cosign Needed Addendum)
 " Psychiatric Admission Assessment Adult  Patient Identification: Leonard Davidson MRN:  [id number] Date of Evaluation:  11/24/2024 Chief Complaint:  Adjustment disorder with emotional disturbance [F43.29] Principal Diagnosis: Schizophrenia, paranoid (HCC) Diagnosis:  Principal Problem:   Schizophrenia, paranoid (HCC) Active Problems:   Seasonal allergies  History of Present Illness: Leonard Davidson is a 29 year old male with a significant psychiatric history including schizophrenia, bipolar disorder, THC use disorder, medication noncompliance, and multiple prior psychiatric hospitalizations at this facility. He initially presented to [facility] ED brought in by law enforcement under IVC petitioned by his mother. At the time of presentation, the patient was reportedly experiencing increased paranoia, was nonadherent with prescribed psychiatric medications, and had eloped from home wearing only a T-shirt and shorts without shoes in cold, snowy weather. UDS was negative. Due to worsening delusional thinking, intrusive thoughts, and paranoid behavior toward family members, the patient was admitted to the [facility] for inpatient psychiatric stabilization.   Per IVC Petition:  RESPONDANT STATED TO MOTHER EARLIER THIS DATE THAT HE WISHED HE WAS NOT HERE, MOTHER STATED THAT RESPONDANT RAN FROM THE RESIDENCE IN AT [employer], CURRENT WEATHER IS [address] AND SNOW ON GROUND. RESPONDANT WAS PICKED UP BY HIS FATHER RUNNING DOWN THE ROADWAY AND CARRIED BACK TO THE RESDIENCE. MOTHER STATES THAT RESPONDANT WAS TREATED AT HIS THERAPIST LAST WEEK AND THERAPIST STATED THAT HE WAS NOT TAKING MEDICATIONS PROPERLY. MOTHER STATES THAT RESPONDANT HAS BEEN DIAGNOSED WITH SCHIZOPRENIA IN 2017.   Evaluation on Unit: The patient reports that he had been being delusional and thinking things that arent true. He states that he stopped taking his psychiatric medications approximately one  and a half to two weeks prior to admission because they made him feel drowsy and sleepy, and he wanted to see how he felt without them. The patient states, now I see I cant do without it. He reports increased paranoia during this time and acknowledges that he ran away from home wearing only shorts and a T-shirt in cold weather. The patient reports that his symptoms progressively worsened over the past one and a half to two weeks. The patient denies current or past suicidal ideation, homicidal ideation, intent, or plan. He denies any history of suicide attempts or self-harming behaviors and denies access to firearms. He denies depressive symptoms, anxiety symptoms, PTSD-related symptoms, or manic symptoms at this time. He reports sleeping well, averaging 6-7 hours per night.  The patient reports a history of schizophrenia, and bipolar disorder, medication noncompliance, and multiple prior psychiatric hospitalizations at this [facility], with the most recent admission in November 2024 for psychosis and two prior admissions in October 2017. He reports being connected with outpatient psychiatric services through the [facility] in [place]. The patient states that he has previously tried a long-acting antipsychotic injection but reports poor tolerance, stating, I cant do that. I cant function. It puts me out, and currently declines this option. He reports current medications of Invega  and valproic  acid, with past trials including risperidone  and Depakote  DR.  Objectively, the patient appears calm and cooperative during the interview. He is alert and oriented and engages appropriately.Thought process is linear but notable for ongoing psychotic content. The patient reports improvement in symptoms since presentation to the emergency department, though he remains mildly psychotic. No acute agitation or behavioral disturbances are observed. Insight and judgment are limited but  improving. UDS in the ED was negative. Home medications, including Invega  9 mg daily and valproic   acid 750 mg daily, were resumed in the ED. New labs ordered include hemoglobin A1c, lipid panel, and vitamin D  level. The case was reviewed with the attending psychiatrist, and the plan is to continue the current medication regimen. Long-acting injectable antipsychotic treatment was discussed; however, the patient declines at this time.  Associated Signs/Symptoms: Depression Symptoms:  Denies  (Hypo) Manic Symptoms:  Delusions, Hallucinations, Impulsivity, Labiality of Mood, Anxiety Symptoms:  Denies  Psychotic Symptoms:  Delusions, Hallucinations: Auditory Visual Paranoia, PTSD Symptoms: Denies  Did the patient present with any abnormal findings indicating the need for additional neurological or psychological testing?  No  Total Time spent with patient: 1.5 hours   Information collected from patient and chart review  Past Psychiatric History:  Previous psychiatric diagnoses: Schizophrenia, bipolar disorder, cannabis abuse, medication noncompliance Prior inpatient psychiatric treatment: Multiple psychiatric hospitalizations at this behavioral health hospital; most recent admission in November 2024 for psychosis; two prior admissions in October 2017 Current/prior outpatient psychiatric treatment: Currently connected with [facility] in [place]; previously referred to [facility] following last discharge Prior rehabilitation treatment: Denies Psychotherapy treatment: Denies History of suicide: Denies suicidal ideation, suicide attempts, or self-harming behaviors History of homicide: Denies homicidal ideation or violent behavior Psychiatric medication history: Current medications include Invega  9 mg PO daily and valproic  acid 750 mg PO daily; past trials include risperidone  and Depakote  DR Psychiatric medication compliance history: History of medication noncompliance; patient reports  stopping medications 1.5-2 weeks prior to admission due to sedation Neuromodulation history: Denies ECT, TMS, or other neuromodulation treatments Current psychiatrist: Managed through [facility] Current therapist: Denies   Substance Abuse History Alcohol: Denies Tobacco: Denies Illicit drugs: Denies; past history cannabis use disorder (per chart review) Prescription drug abuse: Denies Rehabilitation: Denies   Past Medical History Medical diagnoses: Seasonal allergies Home medications:Flonase  (as needed) Prior hospitalizations: Not reported  Prior surgeries/trauma: Denies Head trauma/LOC/concussions/seizures: Denies Allergies: Denies medication or food allergies Primary care provider: Not reported   Family History Medical: Not reported Psychiatric: Grandfather with bipolar disorder (per chart review) Psychotropic medication history: Not reported Suicide attempts/completed suicide: Denies Substance Use: Denies   Social History Childhood: Raised by mother and paternal grandmother  Abuse (emotional/physical/sexual): Denies Marital status: Single Sexual orientation: Heterosexual Gender identification: Male Children: None Employment: Employed through [employer] Education: Completed 12th grade Peer group: Immediate family Housing: Lives with paternal grandmother Finances: Not reported Legal: Denies legal issues   Is the patient at risk to self? No.  Has the patient been a risk to self in the past 6 months? No.  Has the patient been a risk to self within the distant past? No.  Is the patient a risk to others? No.  Has the patient been a risk to others in the past 6 months? No.  Has the patient been a risk to others within the distant past? No.   Columbia Scale:  Flowsheet Row [facility]) [facility] 11/21/2024 in [facility] Department at [facility] from 03/24/2024 in [facility] at [facility]   C-SSRS RISK CATEGORY No Risk No Risk No Risk     Alcohol Screening: Patient refused Alcohol Screening Tool: Yes 1. How often do you have a drink containing alcohol?: Never 2. How many drinks containing alcohol do you have on a typical day when you are drinking?: 1 or 2 3. How often do you have six or more drinks on one occasion?: Never  AUDIT-C Score: 0 4. How often during the last year have you found that you were not able to stop drinking once you had started?: Never 5. How often during the last year have you failed to do what was normally expected from you because of drinking?: Never 6. How often during the last year have you needed a first drink in the morning to get yourself going after a heavy drinking session?: Never 7. How often during the last year have you had a feeling of guilt of remorse after drinking?: Never 8. How often during the last year have you been unable to remember what happened the night before because you had been drinking?: Never 9. Have you or someone else been injured as a result of your drinking?: No 10. Has a relative or friend or a doctor or another health worker been concerned about your drinking or suggested you cut down?: No Alcohol Use Disorder Identification Test Final Score (AUDIT): 0 Alcohol Brief Interventions/Follow-up: Patient Refused Substance Abuse History in the last 12 months:  No. Consequences of Substance Abuse: NA Previous Psychotropic Medications: Yes  Psychological Evaluations: No  Past Medical History:  Past Medical History:  Diagnosis Date   Bipolar 1 disorder (HCC)    Seasonal allergies     Past Surgical History:  Procedure Laterality Date   NO PAST SURGERIES     Family History:  Family History  Problem Relation Age of Onset   Diabetes Father    Family Psychiatric  History: Grandfather with bipolar illness, per chart review Tobacco Screening: Tobacco Use History[1]  BH Tobacco Counseling      Are you interested in Tobacco Cessation Medications?  No value filed. Counseled patient on smoking cessation:  No value filed. Reason Tobacco Screening Not Completed: No value filed.       Social History:  Social History   Substance and Sexual Activity  Alcohol Use Not Currently   Comment: sometimes     Social History   Substance and Sexual Activity  Drug Use Not Currently    Additional Social History:   Allergies:  Allergies[2] Lab Results: No results found for this or any previous visit (from the past 48 hours).  Blood Alcohol level:  Lab Results  Component Value Date   [facility] <15 11/21/2024   ETH <10 09/27/2023    Metabolic Disorder Labs:  Lab Results  Component Value Date   HGBA1C 5.3 08/17/2016   MPG 105 08/17/2016   No results found for: PROLACTIN Lab Results  Component Value Date   CHOL 160 07/07/2020   TRIG 74 07/07/2020   HDL 27 (L) 07/07/2020   CHOLHDL 3.7 08/17/2016   VLDL 11 08/17/2016   LDLCALC 119 (H) 07/07/2020   LDLCALC 118 (H) 06/25/2018    Current Medications: Current Facility-Administered Medications  Medication Dose Route Frequency Provider Last Rate Last Admin   acetaminophen  (TYLENOL ) tablet 650 mg  650 mg Oral Q6H PRN Madaram, Kondal R, MD       alum & mag hydroxide-simeth (MAALOX/MYLANTA) 200-200-20 MG/5ML suspension 30 mL  30 mL Oral Q4H PRN Madaram, Kondal R, MD       haloperidol  (HALDOL ) tablet 5 mg  5 mg Oral TID PRN Jermone Geister H, NP       And   diphenhydrAMINE  (BENADRYL ) capsule 50 mg  50 mg Oral TID PRN Jakita Dutkiewicz H, NP       haloperidol  lactate (HALDOL ) injection 5 mg  5 mg Intramuscular TID PRN Blair Chiquita DEL, NP  And   diphenhydrAMINE  (BENADRYL ) injection 50 mg  50 mg Intramuscular TID PRN Danijela Vessey H, NP       And   LORazepam  (ATIVAN ) injection 2 mg  2 mg Intramuscular TID PRN Dajaun Goldring H, NP       haloperidol  lactate (HALDOL ) injection 10 mg  10 mg Intramuscular TID PRN Jobin Montelongo,  Alioune Hodgkin H, NP       And   diphenhydrAMINE  (BENADRYL ) injection 50 mg  50 mg Intramuscular TID PRN Jahad Old H, NP       And   LORazepam  (ATIVAN ) injection 2 mg  2 mg Intramuscular TID PRN Jonte Wollam H, NP       fluticasone  (FLONASE ) 50 MCG/ACT nasal spray 2 spray  2 spray Each Nare Daily PRN Graig Hessling H, NP       hydrOXYzine  (ATARAX ) tablet 25 mg  25 mg Oral TID PRN Toris Laverdiere H, NP       magnesium  hydroxide (MILK OF MAGNESIA) suspension 30 mL  30 mL Oral Daily PRN Madaram, Kondal R, MD       paliperidone  (INVEGA ) 24 hr tablet 9 mg  9 mg Oral Daily Madaram, Kondal R, MD   9 mg at 11/24/24 0819   traZODone  (DESYREL ) tablet 50 mg  50 mg Oral QHS Madaram, Kondal R, MD   50 mg at 11/23/24 2033   valproic  acid (DEPAKENE ) 250 MG/5ML solution 750 mg  750 mg Oral Daily Madaram, Kondal R, MD   750 mg at 11/24/24 9182   PTA Medications: Medications Prior to Admission  Medication Sig Dispense Refill Last Dose/Taking   amoxicillin -clavulanate (AUGMENTIN ) 875-125 MG tablet Take 1 tablet by mouth 2 (two) times daily. (Patient not taking: Reported on 11/22/2024)      divalproex  (DEPAKOTE ) 250 MG DR tablet Take by mouth 2 (two) times daily.      fluticasone  (FLONASE ) 50 MCG/ACT nasal spray Place 2 sprays into both nostrils daily.      methylPREDNISolone  (MEDROL  DOSEPAK) 4 MG TBPK tablet Take 4 mg by mouth 3 (three) times daily. (Patient not taking: Reported on 11/22/2024)      paliperidone  (INVEGA ) 9 MG 24 hr tablet Take 1 tablet (9 mg total) by mouth daily. 30 tablet 0    phenazopyridine  (PYRIDIUM ) 200 MG tablet Take 1 tablet (200 mg total) by mouth 3 (three) times daily. (Patient not taking: Reported on 11/22/2024) 6 tablet 0    valproic  acid (DEPAKENE ) 250 MG/5ML solution Take 15 mLs (750 mg total) by mouth daily. (Patient not taking: Reported on 11/22/2024) 473 mL 0     AIMS:  ,  ,  ,  ,  ,  ,    Musculoskeletal: Strength & Muscle Tone: within normal limits Gait & Station:  normal Patient leans: N/A     Psychiatric Specialty Exam:  Presentation  General Appearance: Appropriate for Environment; Casual  Eye Contact:Good  Speech:Clear and Coherent; Normal Rate  Speech Volume:Normal  Handedness:Right   Mood and Affect  Mood:Euthymic  Affect:Congruent   Thought Process  Thought Processes:Coherent; Goal Directed  Duration of Psychotic Symptoms:Greater than 6 months Past Diagnosis of Schizophrenia or Psychoactive disorder: Yes  Descriptions of Associations:Intact  Orientation:Full (Time, Place and Person)  Thought Content:Logical  Hallucinations:Hallucinations: None  Ideas of Reference:None  Suicidal Thoughts:Suicidal Thoughts: No  Homicidal Thoughts:Homicidal Thoughts: No   Sensorium  Memory:Immediate Fair; Recent Fair  Judgment:Fair  Insight:Fair   Executive Functions  Concentration:Good  Attention Span:Good  Recall:Fair  Fund of Knowledge:Good  Language:Good  Psychomotor Activity  Psychomotor Activity:Psychomotor Activity: Normal   Assets  Assets:Communication Skills; Resilience; Social Support; Housing; Physical Health   Sleep  Sleep:Sleep: Good  Estimated Sleeping Duration (Last 24 Hours): 4.00-5.00 hours   Physical Exam: Physical Exam Vitals and nursing note reviewed.  Constitutional:      General: He is not in acute distress. HENT:     Head: Normocephalic and atraumatic.     Mouth/Throat:     Pharynx: Oropharynx is clear.  Pulmonary:     Effort: No respiratory distress.  Musculoskeletal:        General: Normal range of motion.  Skin:    General: Skin is dry.  Neurological:     Mental Status: He is alert and oriented to person, place, and time.    Review of Systems  Psychiatric/Behavioral:  Positive for hallucinations. Negative for depression, substance abuse and suicidal ideas. The patient is nervous/anxious. The patient does not have insomnia.   All other systems reviewed and are  negative.  Blood pressure 114/74, pulse 67, temperature 98.1 F (36.7 C), temperature source Oral, resp. rate 18, height 5' 10 (1.778 m), weight 69.9 kg, SpO2 97%. Body mass index is 22.1 kg/m.  Treatment Plan Summary: Daily contact with patient to assess and evaluate symptoms and progress in treatment and Medication management   ASSESSMENT: The patient is a 29 year old male with a chronic psychotic illness who presents with an acute exacerbation of psychosis due to medication noncompliance. His recent decompensation is characterized by worsening paranoia, delusional thinking, impaired judgment, and unsafe behavior, including elopement from home in cold weather. Although he denies suicidal or homicidal ideation, his impaired insight, history of nonadherence, and recurrent hospitalizations place him at elevated risk for further decompensation without inpatient stabilization. At this time, the patient demonstrates partial improvement with resumption of medications but continues to exhibit psychotic symptoms, warranting continued inpatient psychiatric treatment for stabilization, medication management, and discharge planning.   Diagnoses / Active Problems:  Principal Problem:   Schizophrenia, paranoid (HCC) Active Problems:   Seasonal allergies                  PLAN: Safety and Monitoring: -- Involuntary (will uphold) admission to inpatient psychiatric unit for safety, stabilization and treatment -- Daily contact with patient to assess and evaluate symptoms and progress in treatment -- Patient's case to be discussed in multi-disciplinary team meeting -- Observation Level: q15 minute checks -- Vital signs:  q12 hours -- Precautions: suicide, elopement, and assault   2. Psychiatric Treatment:  # Schizophrenia -- Continue Invega  24-hour tablet 9 mg oral daily -- Continue Depakene  750 mg oral daily -- Continue trazodone  50 mg, oral, daily at bedtime, sleep -- Hydroxyzine  25 mg oral, 3  times daily as needed, anxiety -- Haldol  BH Agitation Protocol (See MAR)                   3. Medical Issues Being Addressed:        # Seasonal allergies -- Flonase  nasal spray 2 spray, each nare, daily as needed     4. Labs  -- CBC: Unremarkable -- CMP: Blood glucose 106, otherwise normal -- Ethanol: <15 -- UDS: Negative -- QT/QTc: 422/365  New labs ordered: Hemoglobin A1c, lipid panel, vitamin D     -- The risks/benefits/side-effects/alternatives to this medication were discussed in detail with the patient and time was given for questions. The patient consents to medication trial.  -- FDA -- Metabolic profile and EKG monitoring obtained while on  an atypical antipsychotic (BMI: Lipid Panel: HbgA1c: QTc:)  -- Encouraged patient to participate in unit milieu and in scheduled group therapies  -- Short Term Goals: Ability to identify changes in lifestyle to reduce recurrence of condition will improve, Ability to verbalize feelings will improve, Ability to disclose and discuss suicidal ideas, Ability to demonstrate self-control will improve, Ability to identify and develop effective coping behaviors will improve, Ability to maintain clinical measurements within normal limits will improve, Compliance with prescribed medications will improve, and Ability to identify triggers associated with substance abuse/mental health issues will improve -- Long Term Goals: Improvement in symptoms so as ready for discharge     5. Discharge Planning:  -- Social work and case management to assist with discharge planning and identification of hospital follow-up needs prior to discharge -- Estimated LOS: 5-7 days -- Discharge Concerns: Need to establish a safety plan; Medication compliance and effectiveness -- Discharge Goals: Return home with outpatient referrals for mental health follow-up including medication management/psychotherapy        Physician Treatment Plan for Primary Diagnosis: Schizophrenia,  paranoid (HCC) Long Term Goal(s): Improvement in symptoms so as ready for discharge  Short Term Goals: Ability to identify changes in lifestyle to reduce recurrence of condition will improve, Ability to verbalize feelings will improve, Ability to disclose and discuss suicidal ideas, Ability to demonstrate self-control will improve, Ability to identify and develop effective coping behaviors will improve, Ability to maintain clinical measurements within normal limits will improve, Compliance with prescribed medications will improve, and Ability to identify triggers associated with substance abuse/mental health issues will improve   I certify that inpatient services furnished can reasonably be expected to improve the patient's condition.    Blair Chiquita Hint, NP 1/21/20262:07 PM     [1]  Social History Tobacco Use  Smoking Status Never  Smokeless Tobacco Never  [2] No Known Allergies  "

## 2024-11-24 NOTE — Group Note (Signed)
 Date:  11/24/2024 Time:  4:36 PM  Group Topic/Focus:  Emotional Wellness    Participation Level:  Active  Participation Quality:  Appropriate  Affect:  Appropriate  Cognitive:  Appropriate  Insight: Appropriate  Engagement in Group:  Engaged  Modes of Intervention:  Discussion  Additional Comments:  Pt attended group and participated with the questions asked.   Garnie Borchardt E Twyla Dais 11/24/2024, 4:36 PM

## 2024-11-24 NOTE — Group Note (Signed)
 Date:  11/24/2024 Time:  9:19 AM  Group Topic/Focus:  Goals Group:   The focus of this group is to help patients establish daily goals to achieve during treatment and discuss how the patient can incorporate goal setting into their daily lives to aide in recovery. Orientation:   The focus of this group is to educate the patient on the purpose and policies of crisis stabilization and provide a format to answer questions about their admission.  The group details unit policies and expectations of patients while admitted.    Participation Level:  Active  Participation Quality:  Appropriate  Affect:  Appropriate  Cognitive:  Appropriate  Insight: Appropriate  Engagement in Group:  Engaged  Modes of Intervention:  Education and Orientation  Additional Comments:  Patient attended group as we discussed unit rules and expectations of the unit. We also discussed things about the patient to bond with other patients over common interested. This patient loves to listen to music especially Gospel music and it is a great escape. We went over the chain of command for making sure every feels heard and listened to and went over their care team and what they should expect.   Leonard Davidson 11/24/2024, 9:19 AM

## 2024-11-24 NOTE — Plan of Care (Signed)
   Problem: Education: Goal: Emotional status will improve Outcome: Progressing Goal: Mental status will improve Outcome: Progressing Goal: Verbalization of understanding the information provided will improve Outcome: Progressing

## 2024-11-24 NOTE — Group Note (Unsigned)
 Date:  11/24/2024 Time:  2:23 PM  Group Topic/Focus:  Nursing Group   Participation Level:  {BHH PARTICIPATION OZCZO:77735}  Participation Quality:  {BHH PARTICIPATION QUALITY:22265}  Affect:  {BHH AFFECT:22266}  Cognitive:  {BHH COGNITIVE:22267}  Insight: {BHH Insight2:20797}  Engagement in Group:  {BHH ENGAGEMENT IN HMNLE:77731}  Modes of Intervention:  {BHH MODES OF INTERVENTION:22269}  Additional Comments:  ***  Tim Corriher L Riyanna Crutchley 11/24/2024, 2:23 PM

## 2024-11-24 NOTE — Group Note (Signed)
 Date:  11/24/2024 Time:  5:16 PM  Group Topic/Focus:  Making Healthy Choices:   The focus of this group is to help patients identify negative/unhealthy choices they were using prior to admission and identify positive/healthier coping strategies to replace them upon discharge. Things that we can do to improve overall brain health.      Participation Level:  Active  Participation Quality:  Appropriate and Attentive  Affect:  Appropriate  Cognitive:  Appropriate  Insight: Appropriate  Engagement in Group:  Engaged  Modes of Intervention:  Discussion and Education  Additional Comments:    Juliene CHRISTELLA Huddle 11/24/2024, 5:16 PM

## 2024-11-24 NOTE — Progress Notes (Signed)
 Patient continues to require redirection to stay in bedroom due to other patient still being asleep on the unit and it not being an appropriate time to wonder the halls. Patient has been informed by clinical research associate that he will be able to come out of bedroom and walk the halls at 0600. Patient continues to come to clinical research associate and ask about the adolescent unit and continues to state that he needs to see them kids. Writer informed patient that he will not be able to see nor interact with the children on the adolescent unit.

## 2024-11-24 NOTE — [facility] Suicide Risk Assessment (Signed)
 Suicide Risk Assessment  Admission Assessment    [facility] Admission Suicide Risk Assessment   Nursing information obtained from:  Patient Demographic factors:  Male Current Mental Status:  NA Loss Factors:  Financial problems / change in socioeconomic status Historical Factors:  NA Risk Reduction Factors:  Positive coping skills or problem solving skills  Total Time spent with patient: 1.5 hours Principal Problem: Adjustment disorder with emotional disturbance Diagnosis:  Principal Problem:   Adjustment disorder with emotional disturbance  Subjective Data: Leonard Davidson is a 29 year old male with a significant psychiatric history including schizophrenia, bipolar disorder, THC use disorder, medication noncompliance, and multiple prior psychiatric hospitalizations at this facility. He initially presented to [facility] ED brought in by law enforcement under IVC petitioned by his mother. At the time of presentation, the patient was reportedly experiencing increased paranoia, was nonadherent with prescribed psychiatric medications, and had eloped from home wearing only a T-shirt and shorts without shoes in cold, snowy weather. UDS was negative. Due to worsening delusional thinking, intrusive thoughts, and paranoid behavior toward family members, the patient was admitted to the [facility] for inpatient psychiatric stabilization.   Per IVC Petition:  RESPONDANT STATED TO MOTHER EARLIER THIS DATE THAT HE WISHED HE WAS NOT HERE, MOTHER STATED THAT RESPONDANT RAN FROM THE RESIDENCE IN [place] [employer], CURRENT WEATHER IS [address] AND SNOW ON GROUND. RESPONDANT WAS PICKED UP BY HIS FATHER RUNNING DOWN THE ROADWAY AND CARRIED BACK TO THE RESDIENCE. MOTHER STATES THAT RESPONDANT WAS TREATED AT HIS THERAPIST LAST WEEK AND THERAPIST STATED THAT HE WAS NOT TAKING MEDICATIONS PROPERLY. MOTHER STATES THAT RESPONDANT HAS BEEN DIAGNOSED WITH SCHIZOPRENIA IN 2017.  Continued Clinical  Symptoms:  Alcohol Use Disorder Identification Test Final Score (AUDIT): 0 The Alcohol Use Disorders Identification Test, Guidelines for Use in Primary Care, Second Edition.  World Science Writer [facility]). Score between 0-7:  no or low risk or alcohol related problems. Score between 8-15:  moderate risk of alcohol related problems. Score between 16-19:  high risk of alcohol related problems. Score 20 or above:  warrants further diagnostic evaluation for alcohol dependence and treatment.   CLINICAL FACTORS:   Schizophrenia:   Paranoid or undifferentiated type Currently Psychotic Previous Psychiatric Diagnoses and Treatments   Musculoskeletal: Strength & Muscle Tone: within normal limits Gait & Station: normal Patient leans: N/A  Psychiatric Specialty Exam:  Presentation  General Appearance: Appropriate for Environment; Casual  Eye Contact:Good  Speech:Clear and Coherent; Normal Rate  Speech Volume:Normal  Handedness:Right   Mood and Affect  Mood:Euthymic  Affect:Congruent   Thought Process  Thought Processes:Coherent; Goal Directed  Descriptions of Associations:Intact  Orientation:Full (Time, Place and Person)  Thought Content:Logical  History of Schizophrenia/Schizoaffective disorder:Yes  Duration of Psychotic Symptoms:Greater than six months  Hallucinations:Hallucinations: None  Ideas of Reference:None  Suicidal Thoughts:Suicidal Thoughts: No  Homicidal Thoughts:Homicidal Thoughts: No   Sensorium  Memory:Immediate Fair; Recent Fair  Judgment:Fair  Insight:Fair   Executive Functions  Concentration:Good  Attention Span:Good  Recall:Fair  Fund of Knowledge:Good  Language:Good   Psychomotor Activity  Psychomotor Activity:Psychomotor Activity: Normal   Assets  Assets:Communication Skills; Resilience; Social Support; Housing; Physical Health   Sleep  Sleep:Sleep: Good    Physical Exam: Physical Exam ROS Blood pressure  114/74, pulse 67, temperature 98.1 F (36.7 C), temperature source Oral, resp. rate 18, height 5' 10 (1.778 m), weight 69.9 kg, SpO2 97%. Body mass index is 22.1 kg/m.   COGNITIVE FEATURES THAT CONTRIBUTE TO RISK:  Polarized thinking  SUICIDE RISK:   Minimal: No identifiable suicidal ideation.  Patients presenting with no risk factors but with morbid ruminations; may be classified as minimal risk based on the severity of the depressive symptoms  PLAN OF CARE: See H&P for assessment and plan.  I certify that inpatient services furnished can reasonably be expected to improve the patient's condition.   Blair Chiquita Hint, NP 11/24/2024, 12:02 PM

## 2024-11-24 NOTE — [facility] Counselor (Signed)
 Adult Comprehensive Assessment  Patient ID: Leonard Davidson, male   DOB: 23-Oct-1996, 29 y.o.   MRN: [id number]  Information Source: Information source: Patient  Current Stressors:  Patient states their primary concerns and needs for treatment are:: delusional thoughts Patient states their goals for this hospitilization and ongoing recovery are:: help others, talk to others, get better and go back to my family Educational / Learning stressors: no Employment / Job issues: no Family Relationships: no Surveyor, Quantity / Lack of resources (include bankruptcy): no Housing / Lack of housing: no Physical health (include injuries & life threatening diseases): no Social relationships: no Substance abuse: no Bereavement / Loss: no  Living/Environment/Situation:  Living Arrangements: Other relatives Living conditions (as described by patient or guardian): good Who else lives in the home?: I live with my grandma (my dad's mom).  It's just the two of us . How long has patient lived in current situation?: 7 - 8 years What is atmosphere in current home: Comfortable, Supportive, Loving  Family History:  Marital status: Single Are you sexually active?: Yes What is your sexual orientation?: heterosexual Has your sexual activity been affected by drugs, alcohol, medication, or emotional stress?: no Does patient have children?: No  Childhood History:  By whom was/is the patient raised?: Both parents, Grandparents Additional childhood history information: I lived wiht my mom throughout school.  According to the information in the file, patient previously reported that his parents divorced when he was a small child. Description of patient's relationship with caregiver when they were a child: it was good Patient's description of current relationship with people who raised him/her: good How were you disciplined when you got in trouble as a child/adolescent?: belt, no  bruises Does patient have siblings?: Yes Number of Siblings: 1 Description of patient's current relationship with siblings: My older brother is 19.  Our relationship is good. Did patient suffer any verbal/emotional/physical/sexual abuse as a child?: No Did patient suffer from severe childhood neglect?: No Has patient ever been sexually abused/assaulted/raped as an adolescent or adult?: No Was the patient ever a victim of a crime or a disaster?: No Witnessed domestic violence?: No Has patient been affected by domestic violence as an adult?: No  Education:  Highest grade of school patient has completed: I have a GED. Currently a student?: No Learning disability?: No  Employment/Work Situation:   Employment Situation: Employed Where is Patient Currently Employed?: [employer], real estate How Long has Patient Been Employed?: a few years Are You Satisfied With Your Job?: Yes Do You Work More Than One Job?: Yes Work Stressors: people, some people cuss Patient's Job has Been Impacted by Current Illness: No What is the Longest Time Patient has Held a Job?: 2 years Where was the Patient Employed at that Time?: [employer] Has Patient ever Been in the [employer]?: No  Financial Resources:   Financial resources: Income from employment, Medicaid Does patient have a representative payee or guardian?: No  Alcohol/Substance Abuse:   What has been your use of drugs/alcohol within the last 12 months?: no If attempted suicide, did drugs/alcohol play a role in this?: No Alcohol/Substance Abuse Treatment Hx: Denies past history Does patient live in an environment that promotes recovery or serves as an obstacle to recovery?: No Are others in the home using alcohol or other substances?: No Are significant others in the home willing to participate in the patient's care?: No Has alcohol/substance abuse ever caused legal problems?: No  Social Support System:   Conservation Officer, Nature Support  System:  Good Describe Community Support System: My immediate family:  my mom, my brother, my dad, my grandma. Type of faith/religion: Sherlean How does patient's faith help to cope with current illness?: I pray, but I don't go to church.  Leisure/Recreation:   Do You Have Hobbies?: Yes Leisure and Hobbies: I like to play basketball, lift weights, listen to music, and edit videos.  Strengths/Needs:   What is the patient's perception of their strengths?: I'm an outgoing person, and kids can look up to me. Patient states they can use these personal strengths during their treatment to contribute to their recovery: helping people, talking to people, my communication skills, listen to people's stories and try to help them. Patient states these barriers may affect/interfere with their treatment: just not going hard enough as I should.  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 Patient states these barriers may affect their return to the community: none reported Other important information patient would like considered in planning for their treatment: none reported  Discharge Plan:   Patient states concerns and preferences for aftercare planning are: I use [facility] for that.  Patient reported that he has [facility]. Patient states they will know when they are safe and ready for discharge when: The team here will let me know when I'm ready. Does patient have access to transportation?: Yes Does patient have financial barriers related to discharge medications?: Yes Will patient be returning to same living situation after discharge?: Yes  Summary/Recommendations:   Summary and Recommendations (to be completed by the evaluator): Taiga Lupinacci is a 29 year old man voluntariy admitted to [facility] from [facility] Emergency Department at [facility] due to suicidal ideations and delusions.  It was reported that patient  stated that he does not want to be here and left the home wearing in shorts despite cold weather.  Patient has stopped taking medications a week ago.  It's unclear what type of delusions patient experienced, however it was reported that these were not visual or auditory hallucinations.  During the assesment, patient was polite.  He reported that he lives with his grandmother, where he will return upon discharge.  His urinary drug screen tested negative for all substances, and he denied any substance use in the past 12 months.  Patient reported that he has a good support system (mom, brother, dad and grandma).  Patient stated that he receives services from [facility] in [place].  Patient stated that he lives with his grandma, and will return there upon discharge.  He said he doesn't have access to firearms, and there are no firearms in the home.  While here, Josel Keo can benefit from crisis stabilization, medication management, therapeutic milieu, and referrals for services.   Mayerly Kaman O Mirriam Vadala, LCSW 11/24/2024

## 2024-11-24 NOTE — Progress Notes (Signed)
(  Sleep Hours) -8.5   (Any PRNs that were needed, meds refused, or side effects to meds)- Vistaril 25 mg  (Any disturbances and when (visitation, over night)-none  (Concerns raised by the patient)- none  (SI/HI/AVH)-denies

## 2024-11-24 NOTE — Group Note (Signed)
 Date:  11/24/2024 Time:  10:31 AM  Group Topic/Focus:  Early Warning Signs:   The focus of this group is to help patients identify signs or symptoms they exhibit before slipping into an unhealthy state or crisis. Emotional Education:   The focus of this group is to discuss what feelings/emotions are, and how they are experienced. Healthy Communication:   The focus of this group is to discuss communication, barriers to communication, as well as healthy ways to communicate with others.    Participation Level:  Active  Participation Quality:  Attentive  Affect:  Appropriate  Cognitive:  Appropriate  Insight: Appropriate  Engagement in Group:  Engaged  Modes of Intervention:  Activity, Clarification, Discussion, Education, and Socialization  Additional Comments:  During this group session, we watched a TED Talk titled Stop the Stigma: Why Its Important to Talk About Mental Health. We discussed the importance of destigmatizing harmful language, such as using terms like crazy, when referring to individuals with mental health challenges. Participants were encouraged to share their own perspectives on why open conversations about mental health are important and why seeking help should be normalized without judgment or negative assumptions.  Elie Gragert E Miles Borkowski 11/24/2024, 10:31 AM

## 2024-11-24 NOTE — Group Note (Signed)
 Date:  11/24/2024 Time:  11:54 AM  Group Topic/Focus:  Making Healthy Choices:   The focus of this group is to help patients identify negative/unhealthy choices they were using prior to admission and identify positive/healthier coping strategies to replace them upon discharge. This group was about physical wellness and moving our bodies around for cardio.     Participation Level:  Minimal  Participation Quality:  Appropriate  Affect:  Appropriate  Cognitive:  Appropriate  Insight: Appropriate  Engagement in Group:  Improving  Modes of Intervention:  Activity  Additional Comments:  This group was about physical wellness so we were doing line dances as well as some workouts like pushups to just get blood flowing.   Bralyn Folkert E Tylek Boney 11/24/2024, 11:54 AM

## 2024-11-24 NOTE — Group Note (Signed)
 Date:  11/24/2024 Time:  2:02 PM  Group Topic/Focus:  Early Warning Signs:   The focus of this group is to help patients identify signs or symptoms they exhibit before slipping into an unhealthy state or crisis. Emotional Education:   The focus of this group is to discuss what feelings/emotions are, and how they are experienced. Healthy Communication:   The focus of this group is to discuss communication, barriers to communication, as well as healthy ways to communicate with others.    Participation Level:  Active  Participation Quality:  Appropriate and Attentive  Affect:  Appropriate  Cognitive:  Appropriate  Insight: Appropriate  Engagement in Group:  Engaged  Modes of Intervention:  Activity, Discussion, and Education  Additional Comments:  This group we did an Emotional Wellness group. During this group we watched a TED talk Stop Chasing Purpose and Focus on Wellness by [employer]. During this group I asked the patient what does wellness look like to you? The patient responded and said wellness looks like working out. When emotions are high intelligence is low. All the patients agreed that structure is very important in order to take care of their self.   Leonard Davidson E Ioanna Colquhoun 11/24/2024, 2:02 PM

## 2024-11-25 LAB — LIPID PANEL
Cholesterol: 112 mg/dL (ref 0–200)
HDL: 46 mg/dL
LDL Cholesterol: 63 mg/dL (ref 0–99)
Total CHOL/HDL Ratio: 2.5 ratio
Triglycerides: 19 mg/dL
VLDL: 4 mg/dL (ref 0–40)

## 2024-11-25 LAB — HEMOGLOBIN A1C
Hgb A1c MFr Bld: 5.6 % (ref 4.8–5.6)
Mean Plasma Glucose: 114.02 mg/dL

## 2024-11-25 LAB — VITAMIN D 25 HYDROXY (VIT D DEFICIENCY, FRACTURES): Vit D, 25-Hydroxy: 19.3 ng/mL — ABNORMAL LOW (ref 30–100)

## 2024-11-25 MED ORDER — VITAMIN D (ERGOCALCIFEROL) 1.25 MG (50000 UNIT) PO CAPS
50000.0000 [IU] | ORAL_CAPSULE | ORAL | Status: DC
  Administered 2024-11-25: 50000 [IU] via ORAL
  Filled 2024-11-25: qty 1

## 2024-11-25 NOTE — Progress Notes (Signed)
" °   11/25/24 0800  Psych Admission Type (Psych Patients Only)  Admission Status Involuntary  Psychosocial Assessment  Patient Complaints None  Eye Contact Fair  Facial Expression Animated  Affect Appropriate to circumstance;Anxious  Speech Logical/coherent  Interaction Assertive  Motor Activity Other (Comment) (WNL)  Appearance/Hygiene Improved  Behavior Characteristics Cooperative  Mood Pleasant  Thought Process  Coherency WDL  Content WDL  Delusions None reported or observed  Perception WDL  Hallucination None reported or observed  Judgment Impaired  Confusion None  Danger to Self  Current suicidal ideation? Denies  Danger to Others  Danger to Others None reported or observed    "

## 2024-11-25 NOTE — Progress Notes (Signed)
(  Sleep Hours) -5.5 (Any PRNs that were needed, meds refused, or side effects to meds)- hydroxyzine  (Any disturbances and when (visitation, over night)-no (Concerns raised by the patient)- no (SI/HI/AVH)- denies

## 2024-11-25 NOTE — Group Note (Signed)
 Date:  11/25/2024 Time:  1:08 PM  Group Topic/Focus:  Coping With Mental Health Crisis:   The purpose of this group is to help patients identify strategies for coping with mental health crisis.  Group discusses possible causes of crisis and ways to manage them effectively.    Participation Level:  Active  Participation Quality:  Appropriate  Affect:  Appropriate  Cognitive:  Appropriate  Insight: Appropriate and Good  Engagement in Group:  Engaged  Modes of Intervention:  Discussion    [name] 11/25/2024, 1:08 PM

## 2024-11-25 NOTE — Plan of Care (Signed)

## 2024-11-25 NOTE — [facility] Suicide Risk Assessment (Signed)
 [facility] INPATIENT:  Family/Significant Other Suicide Prevention Education  Suicide Prevention Education:  Education Completed; Leonard Davidson (mom) [phone],  has been identified by the patient as the family member/significant other with whom the patient will be residing, and identified as the person(s) who will aid the patient in the event of a mental health crisis (suicidal ideations/suicide attempt).  With written consent from the patient, the family member/significant other has been provided the following suicide prevention education, prior to the and/or following the discharge of the patient.  The suicide prevention education provided includes the following: Suicide risk factors Suicide prevention and interventions National Suicide Hotline telephone number [facility] assessment telephone number [facility] Emergency Assistance 911 [facility] and/or Residential Mobile Crisis Unit telephone number  Request made of family/significant other to: Remove weapons (e.g., guns, rifles, knives), all items previously/currently identified as safety concern.   Remove drugs/medications (over-the-counter, prescriptions, illicit drugs), all items previously/currently identified as a safety concern.  The family member/significant other verbalizes understanding of the suicide prevention education information provided.  The family member/significant other agrees to remove the items of safety concern listed above.  Leonard Davidson 11/25/2024, 11:28 AM

## 2024-11-25 NOTE — Group Note (Signed)
 Date:  11/25/2024 Time:  9:58 AM  Group Topic/Focus:  Recreation Therapy/ Music Therapy    Participation Level:  Active  Participation Quality:  Appropriate  Affect:  Appropriate  Cognitive:  Appropriate  Insight: Appropriate  Engagement in Group:  Engaged  Modes of Intervention:  Discussion   [name] 11/25/2024, 9:58 AM

## 2024-11-25 NOTE — Plan of Care (Signed)
   Problem: Education: Goal: Emotional status will improve Outcome: Progressing Goal: Mental status will improve Outcome: Progressing   Problem: Activity: Goal: Sleeping patterns will improve Outcome: Progressing

## 2024-11-25 NOTE — Group Note (Signed)
 Date:  11/25/2024 Time:  9:27 AM  Group Topic/Focus:  Goals Group:   The focus of this group is to help patients establish daily goals to achieve during treatment and discuss how the patient can incorporate goal setting into their daily lives to aide in recovery.    Participation Level:  Did Not Attend  Additional Comments:  Pt knew group was beginning, but, stood outside the group room and would not come in.   [name] 11/25/2024, 9:27 AM

## 2024-11-25 NOTE — Group Note (Signed)
 Date:  11/25/2024 Time:  10:48 AM  Group Topic/Focus:  Nutrition:     Participation Level:  Active  Participation Quality:  Appropriate  Affect:  Appropriate  Cognitive:  Appropriate  Insight: Appropriate  Engagement in Group:  Engaged  Modes of Intervention:  Discussion   [name] 11/25/2024, 10:48 AM

## 2024-11-25 NOTE — Progress Notes (Signed)
 [facility] MD Progress Note  11/25/2024 8:01 AM Leonard Davidson  MRN:  [id number]  Principal Problem: Schizophrenia, paranoid (HCC) Diagnosis: Principal Problem:   Schizophrenia, paranoid (HCC) Active Problems:   Seasonal allergies  Total Time spent with patient:  I personally spent 30 minutes on the unit in direct patient care. The direct patient care time included face-to-face time with the patient, reviewing the patient's chart, communicating with other professionals, and coordinating care.   Identifying Information and Past Psychiatric History:  The patient is a 29 y.o. male (domiciled with grandmother, not employed, receives Industrial/product Designer) with no significant medical history and a psychiatric history most consistent with schizophrenia. He is currently admitted under IVC for psychosis: experiencing increased paranoia, and had eloped from home wearing only a T-shirt and shorts without shoes in cold, snowy weather.  Psychiatric history is notable for prior diagnoses of schizophrenia and bipolar disorder (although no clear evidence of frank manic episode on chart review). He has no prior suicide attempts and has denied significant affective concerns (denies depressive symptoms). He has numerous prior psychiatric hospitalizations for psychotic symptoms, most recently November 2024; prior to that in 2017. Admissions are for psychosis including disorganization in thoughts and behaviors, hallucinations, paranoia, and delusional content as well as negative symptoms including poor self care. No current or prior significant substance use concerns. He has previously been on LAI, but more recently has been on a combination of PO risperidone  and depakote  DR. Patient has [employer] in [place].   Interval events: Patient was evaluated at [facility] as noted above and below, restarted on home depakote  and risperidone ; patient compliant with medications. Documented to be attending groups with  variable level of engagement. Did not require agitation protocol and denying SI, HI and AVH on checks. Slept 6.25 hrs overnight. BP 117/73 (BP Location: Left Arm)   Pulse 79   Temp 97.8 F (36.6 C) (Oral)   Resp 18   Ht 5' 10 (1.778 m)   Wt 69.9 kg   SpO2 98%   BMI 22.10 kg/m   Interview today: Today the patient states he is good, just chilling. Reports he has been enjoying the groups here and that they help pass the time. States that his medications are usually good for him and that he is happy to be back on them. He realized that his brain got cloudy and confused when he went off and plans to continue on them in the future. Continues to decline LAI, but states he is happy to use his regular medications. When asked about AVH, denies and states it's only my mind that gets cloudy, I don't hear things like that. Denies AVH today, denies SI and HI. Reports good sleep, appetite and energy. No additional concerns.   Past Medical History:  Past Medical History:  Diagnosis Date   Bipolar 1 disorder (HCC)    Seasonal allergies     Past Surgical History:  Procedure Laterality Date   NO PAST SURGERIES     Family History:  Family History  Problem Relation Age of Onset   Diabetes Father    Family Psychiatric  History: bipolar disorder in grandfather  Social History:  Social History   Substance and Sexual Activity  Alcohol Use Not Currently   Comment: sometimes     Social History   Substance and Sexual Activity  Drug Use Not Currently    Social History   Socioeconomic History   Marital status: Single    Spouse name: Not  on file   Number of children: Not on file   Years of education: Not on file   Highest education level: Not on file  Occupational History   Not on file  Tobacco Use   Smoking status: Never   Smokeless tobacco: Never  Vaping Use   Vaping status: Never Used  Substance and Sexual Activity   Alcohol use: Not Currently    Comment: sometimes   Drug use:  Not Currently   Sexual activity: Yes  Other Topics Concern   Not on file  Social History Narrative   Not on file   Social Drivers of Health   Tobacco Use: Low Risk (11/23/2024)   Patient History    Smoking Tobacco Use: Never    Smokeless Tobacco Use: Never    Passive Exposure: Not on file  Financial Resource Strain: Not on file  Food Insecurity: No Food Insecurity (11/23/2024)   Epic    Worried About Programme Researcher, Broadcasting/film/video in the Last Year: Never true    Ran Out of Food in the Last Year: Never true  Transportation Needs: No Transportation Needs (11/23/2024)   Epic    Lack of Transportation (Medical): No    Lack of Transportation (Non-Medical): No  Physical Activity: Not on file  Stress: Not on file  Social Connections: Not on file  Depression (EYV7-0): Not on file  Alcohol Screen: Low Risk (11/23/2024)   Alcohol Screen    Last Alcohol Screening Score (AUDIT): 0  Housing: Low Risk (11/23/2024)   Epic    Unable to Pay for Housing in the Last Year: No    Number of Times Moved in the Last Year: 0    Homeless in the Last Year: No  Utilities: Not At Risk (11/23/2024)   Epic    Threatened with loss of utilities: No  Health Literacy: Not on file    Current Medications: Current Facility-Administered Medications  Medication Dose Route Frequency Provider Last Rate Last Admin   acetaminophen  (TYLENOL ) tablet 650 mg  650 mg Oral Q6H PRN Madaram, Kondal R, MD       alum & mag hydroxide-simeth (MAALOX/MYLANTA) 200-200-20 MG/5ML suspension 30 mL  30 mL Oral Q4H PRN Madaram, Kondal R, MD       haloperidol  (HALDOL ) tablet 5 mg  5 mg Oral TID PRN Bennett, Christal H, NP       And   diphenhydrAMINE  (BENADRYL ) capsule 50 mg  50 mg Oral TID PRN Bennett, Christal H, NP       haloperidol  lactate (HALDOL ) injection 5 mg  5 mg Intramuscular TID PRN Bennett, Christal H, NP       And   diphenhydrAMINE  (BENADRYL ) injection 50 mg  50 mg Intramuscular TID PRN Bennett, Christal H, NP       And    LORazepam  (ATIVAN ) injection 2 mg  2 mg Intramuscular TID PRN Bennett, Christal H, NP       haloperidol  lactate (HALDOL ) injection 10 mg  10 mg Intramuscular TID PRN Bennett, Christal H, NP       And   diphenhydrAMINE  (BENADRYL ) injection 50 mg  50 mg Intramuscular TID PRN Bennett, Christal H, NP       And   LORazepam  (ATIVAN ) injection 2 mg  2 mg Intramuscular TID PRN Bennett, Christal H, NP       fluticasone  (FLONASE ) 50 MCG/ACT nasal spray 2 spray  2 spray Each Nare Daily PRN Bennett, Christal H, NP       hydrOXYzine  (ATARAX )  tablet 25 mg  25 mg Oral TID PRN Blair, Christal H, NP   25 mg at 11/24/24 2051   magnesium  hydroxide (MILK OF MAGNESIA) suspension 30 mL  30 mL Oral Daily PRN Madaram, Kondal R, MD       paliperidone  (INVEGA ) 24 hr tablet 9 mg  9 mg Oral Daily Madaram, Kondal R, MD   9 mg at 11/25/24 0755   traZODone  (DESYREL ) tablet 50 mg  50 mg Oral QHS Madaram, Kondal R, MD   50 mg at 11/24/24 2051   valproic  acid (DEPAKENE ) 250 MG/5ML solution 750 mg  750 mg Oral Daily Madaram, Kondal R, MD   750 mg at 11/25/24 9244    Lab Results: No results found for this or any previous visit (from the past 48 hours).  Blood Alcohol level:  Lab Results  Component Value Date   [facility] <15 11/21/2024   ETH <10 09/27/2023    Metabolic Disorder Labs: Lab Results  Component Value Date   HGBA1C 5.3 08/17/2016   MPG 105 08/17/2016   No results found for: PROLACTIN Lab Results  Component Value Date   CHOL 160 07/07/2020   TRIG 74 07/07/2020   HDL 27 (L) 07/07/2020   CHOLHDL 3.7 08/17/2016   VLDL 11 08/17/2016   LDLCALC 119 (H) 07/07/2020   LDLCALC 118 (H) 06/25/2018    Physical Findings: AIMS:  ,  ,  ,  ,  ,  ,   CIWA:    COWS:      Mental Status exam: Appearance: tall, thin, black male, appropriately groomed, seen wearing casual clothing and walking calmly through the milieu  Eye contact: intense gaze  Attitude towards examiner pleasant, friendly  Psychomotor: no agitation  or retardation  Speech: mildly rambling, otherwise largely normal in amount and rate  Language: no delays  Mood: good Affect: restricted, does smile at times  Thought content: denying SI and HI, no overt delusions expressed today  Thought Process: disorganized - mildly tangential  Perception: denying AVH, not RTIS  Insight: limited  Judgement: limited based on recent events; improving (does want to take medications now)  Orientation: x3 Attention/Concentration: fair to good  Memory/Cognition: not formally assessed; grossly intact  Fund of Knowledge: Average    Musculoskeletal: Strength & Muscle Tone: within normal limits Gait & Station: normal Patient leans: N/A   Physical Exam Constitutional:      Appearance: Normal appearance.  HENT:     Head: Normocephalic and atraumatic.  Eyes:     Extraocular Movements: Extraocular movements intact.  Abdominal:     General: There is no distension.  Musculoskeletal:        General: Normal range of motion.     Cervical back: Normal range of motion.  Neurological:     General: No focal deficit present.     Mental Status: He is alert.    Review of Systems  All other systems reviewed and are negative.  Blood pressure 117/73, pulse 79, temperature 97.8 F (36.6 C), temperature source Oral, resp. rate 18, height 5' 10 (1.778 m), weight 69.9 kg, SpO2 98%. Body mass index is 22.1 kg/m.   Treatment Plan Summary: Daily contact with patient to assess and evaluate symptoms and progress in treatment  Assessment: The patient is a 29 y.o. male with a psychiatric history most consistent with schizophrenia. He has had several years of psychotic symptoms including disorganization in thoughts and actions (bizarre behaviors), delusions, paranoia hallucinations, and negative symptoms including poor self care and  decline in functioning. No significant trauma history and no previous or current substance use concerns. Although prior notes document  a bipolar disorder no credible history of mania per patient and chart review and similarly no reported episodes of depression per patient. He has never had a suicide attempt.  On this admission patient presents after recurrence of psychotic symptoms and bizarre behavior (out shoeless in the snow, running away, disorganized) after stopping his PO medications. Patient has preserved insight into mental health concerns and need for medicines and has so far been compliant with restarting invega  and depakote . As he had not been off for a significant amount of time these were restated at admission at prior home doses of 9 mg and 750 mg daily respectively. Today was mildly tangential with intense gaze, otherwise pleasant and not overtly RTIS. Will continue with this regimen and continue to monitor  DSM-5 diagnoses: Schizophrenia   -per ICD 10: schizophrenia, paranoid type   Plan:  Legal Status: -Involuntary: IVC upheld 11/24/24  Safety -q15 minute checks  -elopement, suicide and assault precautions  -daily vitals  Psychiatric Concerns  -Continue Invega  9 mg daily for psychosis -Continue Valproic  acid depakene  solution 750 mg daily  -Trazodone  50 mg at bedtime PRN for sleep -Hydroxyzine  25 mg TID PRN for anxiety -haldol  5mg  + ativan  2 mg + benadryl  50 mg PRN for agitation  Substance use concerns  -None currently   Nicotine Replacement  N/A; does not use tobacco products   Medical concerns Vitamin D  deficiency -per labs today 19.3 -will begin weekly high-dose vitamin D  50 000 U; to be given Thursdays for 12 weeks.   -Flonase  for seasonal allergies  Additional PRNs: -Tylenol  tablets 650 mg every 6 hours as needed for pain -Maalox/Mylanta suspension 30 mL every 4 hours as needed for indigestion  -Milk of Magnesia 30 mL daily as needed for constipation  Labs 1/22 -A1c 5.6 and lipid panel WNL -Vitamin D  low (19.3)  11/21/24 -- CBC: Unremarkable -- CMP: Blood glucose 106, otherwise  normal -- Ethanol: <15 -- UDS: Negative -- QT/QTc: 422/365  Psychosocial interventions  -daily medication management with psychiatry -Medication education regarding risks/benefits and alternatives -bedside psychotherapy as indicated  -Patient will be encouraged to participate and engage with group therapy  -Appreciate SW assistance in coordinating safe disposition  -Anticipated LOS 4-6 days   Leita LOISE Arts, MD 11/25/2024, 8:01 AM

## 2024-11-25 NOTE — [facility] Suicide Risk Assessment (Signed)
 The L[facility]WA contacted [name] (mom) [phone] to provide SPI.    Mom reported that the patient has trouble remembering what happened after an episode.   Mom request that the patient be put on the shot. Stating that the pills work when he takes them but he has to be watch 24/7. Mom asked for the switch so they would not have to continue monitoring the patient 24/7.  Mom stated that the patient will be staying with either herself, dad, or grandmother.   Mom reports that the patient knows how to play the system to be discharged.   Roselyn Lento

## 2024-11-25 NOTE — Group Note (Signed)
 Date:  11/25/2024 Time:  12:36 AM  Group Topic/Focus:  Wrap-Up Group:   The focus of this group is to help patients review their daily goal of treatment and discuss progress on daily workbooks.    Participation Level:  Active  Participation Quality:  Appropriate and Sharing  Affect:  Appropriate and Flat  Cognitive:  Appropriate  Insight: Appropriate  Engagement in Group:  Engaged  Modes of Intervention:  Activity and Socialization  Additional Comments:  Patients shared one word to described their day. Patient shared that his day was insightful because he's been meeting new people and hearing everyone's story. Patient rated his day a 10 out of 10. Patient shared one thing that he has learned, always be patient and stay calm.  Eward Mace 11/25/2024, 12:36 AM

## 2024-11-25 NOTE — Group Note (Signed)
 Recreation Therapy Group Note   Group Topic:Leisure Education  Group Date: 11/25/2024 Start Time: 0955 End Time: 1045 Facilitators: Murphy Duzan-McCall, LRT,CTRS Location: [address] Dayroom   Group Topic: Leisure Education  Goal Area(s) Addresses:  Patient will identify positive leisure and recreation activities.  Patient will identify one positive benefit of participation in leisure activities.   Behavioral Response: Engaged  Intervention: Music Therapy  Activity: LRT and patients talked about the many uses of music. LRT and patients also discussed how music can be used as a leisure outlet. LRT then allowed patients to pick various songs they wanted to listen to and/or introduce peers to. Each song picked had to be clean and appropriate.  Education:  Teacher, English As A Foreign Language, Special Educational Needs Teacher, Building Control Surveyor  Education Outcome: Acknowledges education/In group clarification offered/Needs additional education   Affect/Mood: Appropriate   Participation Level: Engaged   Participation Quality: Independent   Behavior: Appropriate   Speech/Thought Process: Focused   Insight: Good   Judgement: Good   Modes of Intervention: Music   Patient Response to Interventions:  Engaged   Education Outcome:  In group clarification offered    Clinical Observations/Individualized Feedback: Pt was focused and engaged throughout group. Pt music choice showed he was more into music that had a message or just made you relax. Pt was attentive to peers as well. Pt would ask questions of peers about their song choices also.      Plan: Continue to engage patient in RT group sessions 2-3x/week.   Caron Tardif-McCall, LRT,CTRS 11/25/2024 11:12 AM

## 2024-11-25 NOTE — Group Note (Deleted)
 Date:  11/25/2024 Time:  12:34 AM  Group Topic/Focus:  Wrap-Up Group:   The focus of this group is to help patients review their daily goal of treatment and discuss progress on daily workbooks.    Participation Level:  {BHH PARTICIPATION OZCZO:77735}  Participation Quality:  {BHH PARTICIPATION QUALITY:22265}  Affect:  {BHH AFFECT:22266}  Cognitive:  {BHH COGNITIVE:22267}  Insight: {BHH Insight2:20797}  Engagement in Group:  {BHH ENGAGEMENT IN HMNLE:77731}  Modes of Intervention:  {BHH MODES OF INTERVENTION:22269}  Additional Comments:  ***  Leonard Davidson 11/25/2024, 12:34 AM

## 2024-11-26 NOTE — Progress Notes (Signed)
 Recreation Therapy Notes  INPATIENT RECREATION THERAPY ASSESSMENT  Patient Details Name: Leonard Davidson MRN: [id number] DOB: [birth year] Today's Date: 11/26/2024       Information Obtained From: Patient  Able to Participate in Assessment/Interview: Yes  Patient Presentation: Alert  Reason for Admission (Per Patient): Other (Comments) (delusional thoughts)  Patient Stressors: Other (Comment) (just life)  Coping Skills:   Sports, Music, Exercise, Meditate, Deep Breathing, Prayer, Talk, Avoidance, Read, Hot Bath/Shower  Leisure Interests (2+):  Sports - Basketball, Individual - Other (Comment), Music - Listen (create)  Frequency of Recreation/Participation: Other (Comment) (Daily)  Awareness of Community Resources:  Yes  Community Resources:  [employer], Research Scientist (physical Sciences)  Current Use: Yes  If no, Barriers?:    Expressed Interest in [employer] Information: No  [employer] of Residence:  Film/video Editor  Patient Main Form of Transportation: Set Designer  Patient Strengths:  Communication, lead by example, staying out the way  Patient Identified Areas of Improvement:  biting my tongue, listen more than I speak  Patient Goal for Hospitalization:  get well, get out of here  Current SI (including self-harm):  No  Current HI:  No  Current AVH: No  Staff Intervention Plan: Group Attendance, Collaborate with Interdisciplinary Treatment Team  Consent to Intern Participation: N/Leonard   Leonard Davidson, LRT,CTRS Leonard Davidson Leonard Davidson 11/26/2024, 1:54 PM

## 2024-11-26 NOTE — Plan of Care (Signed)
   Problem: Education: Goal: Emotional status will improve Outcome: Progressing Goal: Mental status will improve Outcome: Progressing

## 2024-11-26 NOTE — Progress Notes (Signed)
" °   11/26/24 1500  Psych Admission Type (Psych Patients Only)  Admission Status Involuntary  Psychosocial Assessment  Patient Complaints None  Eye Contact Fair  Facial Expression Animated  Affect Appropriate to circumstance  Speech Logical/coherent  Interaction Assertive  Motor Activity Slow  Appearance/Hygiene Unremarkable  Behavior Characteristics Appropriate to situation  Mood Pleasant  Thought Process  Coherency WDL  Content WDL  Delusions None reported or observed  Perception WDL  Hallucination None reported or observed  Judgment Impaired  Confusion None  Danger to Self  Current suicidal ideation? Denies  Danger to Others  Danger to Others None reported or observed    "

## 2024-11-26 NOTE — [facility] Group Notes (Signed)
 Spirituality Group   Description: Participant directed exploration of values, beliefs and meaning   **Focus on: Locating our values   Following a brief framework of chaplains role and ground rules of group behavior, participants are invited to share concerns or questions that engage spiritual life. Emphasis placed on common themes and shared experiences and ways to make meaning and clarify living into ones values.   Theory/Process/Goal: Utilize the theoretical framework of group therapy established by Celena Kite, Relational Cultural Theory and Rogerian approaches to facilitate relational empathy and use of the here and now to foster reflection, self-awareness, and sharing.   Observations: Leonard Davidson was an active participant  in the group discussion.  [name] L. [name].Div

## 2024-11-26 NOTE — Group Note (Signed)
 Date:  11/26/2024 Time:  9:01 AM  Group Topic/Focus:  Goals Group:   The focus of this group is to help patients establish daily goals to achieve during treatment and discuss how the patient can incorporate goal setting into their daily lives to aide in recovery.    Participation Level:  Did Not Attend

## 2024-11-26 NOTE — Group Note (Signed)
 Date:  11/26/2024 Time:  6:58 AM  Group Topic/Focus:  Wrap-Up Group:   The focus of this group is to help patients review their daily goal of treatment and discuss progress on daily workbooks.    Participation Level:  Active  Participation Quality:  Appropriate  Affect:  Appropriate  Cognitive:  Appropriate  Insight: Appropriate  Engagement in Group:  Engaged  Modes of Intervention:  Education  Additional Comments:  Patient attended and participated in group tonight  Kathlene Yano Dacosta 11/26/2024, 6:58 AM

## 2024-11-26 NOTE — Group Note (Signed)
 Date:  11/26/2024 Time:  8:56 PM  Group Topic/Focus:  Wrap-Up Group:   The focus of this group is to help patients review their daily goal of treatment and discuss progress on daily workbooks.    Participation Level:  Active  Participation Quality:  Appropriate  Affect:  Appropriate  Cognitive:  Alert  Insight: Appropriate  Engagement in Group:  Engaged  Modes of Intervention:  Discussion  Additional Comments:  pt rated his day 10/10 and identified playing basketball and music as a calming mechanism. Leonard Davidson 11/26/2024, 8:56 PM

## 2024-11-26 NOTE — Group Note (Signed)
 Recreation Therapy Group Note   Group Topic:General Recreation  Group Date: 11/26/2024 Start Time: 1030 End Time: 1100 Facilitators: Camille Dragan-McCall, LRT,CTRS Location: [address] Dayroom   Group Topic/Focus: General Recreation   Goal Area(s) Addresses:  Patient will use appropriate interactions in play with peers.   Patient will appropriately follow the rules of the activity.  Behavioral Response: Engaged   Intervention: Competitive Play  Activity: Grab the Mic. LRT flips over a card with a word on it and the patient had to think of a lyric with that word in it. Each card has a number in the corner that represents the points for that word. The person who could think of lyric, races to the mic, picks it up and sings the lyrics. The person with the most points wins the game.     Affect/Mood: Appropriate   Participation Level: Engaged   Participation Quality: Independent   Behavior: Appropriate   Speech/Thought Process: Focused   Insight: Good   Judgement: Good   Modes of Intervention: Competitive Play   Patient Response to Interventions:  Engaged   Education Outcome:  In group clarification offered    Clinical Observations/Individualized Feedback: Pt got off to a slow start in group. Pt became more involved as group went on and was able to come up with more songs. Pt was engaged and cheered on his peers during the course of the group as well.     Plan: Continue to engage patient in RT group sessions 2-3x/week.   Thane Age-McCall, LRT,CTRS 11/26/2024 1:11 PM

## 2024-11-26 NOTE — [facility] Suicide Risk Assessment (Signed)
 [facility] INPATIENT:  Family/Significant Other Suicide Prevention Education  Suicide Prevention Education:  Education Completed; Gracelyn (grandma) [phone],  (name of family member/significant other) has been identified by the patient as the family member/significant other with whom the patient will be residing, and identified as the person(s) who will aid the patient in the event of a mental health crisis (suicidal ideations/suicide attempt).  With written consent from the patient, the family member/significant other has been provided the following suicide prevention education, prior to the and/or following the discharge of the patient.  Grandmother said that she wasn't sure if patient will return to her home, or will go to mom's or dad's home upon discharge.    Grandmother said that patient is a good person and is very polite.    Grandmother said that patient doesn't have access to firearms, and there are no firearms where patient will stay.  Grandmother said that patient doesn't want to take medications, and has difficulty accepting struggles with mental health.    Grandmother confirmed that patient in on [facility].  The suicide prevention education provided includes the following: Suicide risk factors Suicide prevention and interventions National Suicide Hotline telephone number [facility] assessment telephone number [facility] Emergency Assistance 911 [facility] and/or Residential Mobile Crisis Unit telephone number  Request made of family/significant other to: Remove weapons (e.g., guns, rifles, knives), all items previously/currently identified as safety concern.   Remove drugs/medications (over-the-counter, prescriptions, illicit drugs), all items previously/currently identified as a safety concern.  The family member/significant other verbalizes understanding of the suicide prevention education information provided.  The family member/significant other  agrees to remove the items of safety concern listed above.  Wrigley Plasencia O Joelyn Lover, LCSW 11/26/2024, 9:58 AM

## 2024-11-26 NOTE — Progress Notes (Addendum)
 Collateral contact - [name] (mom) [phone]  Mom said that patient will return to her home upon discharge.  Her schedule is flexible, she is working from home, so she hopes that she will be able to pick him up.    Mom said that patient takes medications inconsistently.  He may take medications for 2 months, and then stops.  Mom provided a crisis phone number for [facility] [phone]  Lanijah Warzecha, LCSW 11/26/2024

## 2024-11-26 NOTE — Progress Notes (Signed)
 [facility] MD Progress Note  11/26/2024 8:27 AM Leonard Davidson  MRN:  [id number]  Principal Problem: Schizophrenia, paranoid (HCC) Diagnosis: Principal Problem:   Schizophrenia, paranoid (HCC) Active Problems:   Seasonal allergies  Total Time spent with patient:  I personally spent 30 minutes on the unit in direct patient care. The direct patient care time included face-to-face time with the patient, reviewing the patient's chart, communicating with other professionals, and coordinating care.   Identifying Information and Past Psychiatric History:  The patient is a 29 y.o. male (domiciled with grandmother, not employed, receives Industrial/product Designer) with no significant medical history and a psychiatric history most consistent with schizophrenia. He is currently admitted under IVC for psychosis: experiencing increased paranoia, and had eloped from home wearing only a T-shirt and shorts without shoes in cold, snowy weather.  Psychiatric history is notable for prior diagnoses of schizophrenia and bipolar disorder (although no clear evidence of frank manic episode on chart review). He has no prior suicide attempts and has denied significant affective concerns (denies depressive symptoms). He has numerous prior psychiatric hospitalizations for psychotic symptoms, most recently November 2024; prior to that in 2017. Admissions are for psychosis including disorganization in thoughts and behaviors, hallucinations, paranoia, and delusional content as well as negative symptoms including poor self care. No current or prior significant substance use concerns. He has previously been on LAI, but more recently has been on a combination of PO risperidone  and depakote  DR. Patient has [employer] in [place].   Interval events: Patient was documented to be overall pleasant and cooperative with staff, attending groups, medication compliant. Denying SI, HI and AVH, slept 5.5 hrs overnight.  BP 121/71 (BP  Location: Right Arm)   Pulse (!) 59   Temp 97.6 F (36.4 C) (Oral)   Resp 16   Ht 5' 10 (1.778 m)   Wt 69.9 kg   SpO2 100%   BMI 22.10 kg/m   Interview today: Today the patient states that he is good, just getting my mind right. States that he has no concerns with being back on his medications and is sleeping well. Feels his mind is clearing up. Denies any SI, HI or AVH today. He was hopeful to leave the hospital but notified that he likely needs a few more days on his medicine before he will be ready. This author discussed LAI again today, patient vehemently declines stating there is no need and that the injection made him too tired. This author recommended speaking with his family and considering it. No other questions or concerns voiced.   Past Medical History:  Past Medical History:  Diagnosis Date   Bipolar 1 disorder (HCC)    Seasonal allergies     Past Surgical History:  Procedure Laterality Date   NO PAST SURGERIES     Family History:  Family History  Problem Relation Age of Onset   Diabetes Father    Family Psychiatric  History: bipolar disorder in grandfather  Social History:  Social History   Substance and Sexual Activity  Alcohol Use Not Currently   Comment: sometimes     Social History   Substance and Sexual Activity  Drug Use Not Currently    Social History   Socioeconomic History   Marital status: Single    Spouse name: Not on file   Number of children: Not on file   Years of education: Not on file   Highest education level: Not on file  Occupational History  Not on file  Tobacco Use   Smoking status: Never   Smokeless tobacco: Never  Vaping Use   Vaping status: Never Used  Substance and Sexual Activity   Alcohol use: Not Currently    Comment: sometimes   Drug use: Not Currently   Sexual activity: Yes  Other Topics Concern   Not on file  Social History Narrative   Not on file   Social Drivers of Health   Tobacco Use: Low Risk  (11/23/2024)   Patient History    Smoking Tobacco Use: Never    Smokeless Tobacco Use: Never    Passive Exposure: Not on file  Financial Resource Strain: Not on file  Food Insecurity: No Food Insecurity (11/23/2024)   Epic    Worried About Programme Researcher, Broadcasting/film/video in the Last Year: Never true    Ran Out of Food in the Last Year: Never true  Transportation Needs: No Transportation Needs (11/23/2024)   Epic    Lack of Transportation (Medical): No    Lack of Transportation (Non-Medical): No  Physical Activity: Not on file  Stress: Not on file  Social Connections: Not on file  Depression (EYV7-0): Not on file  Alcohol Screen: Low Risk (11/23/2024)   Alcohol Screen    Last Alcohol Screening Score (AUDIT): 0  Housing: Low Risk (11/23/2024)   Epic    Unable to Pay for Housing in the Last Year: No    Number of Times Moved in the Last Year: 0    Homeless in the Last Year: No  Utilities: Not At Risk (11/23/2024)   Epic    Threatened with loss of utilities: No  Health Literacy: Not on file    Current Medications: Current Facility-Administered Medications  Medication Dose Route Frequency Provider Last Rate Last Admin   acetaminophen  (TYLENOL ) tablet 650 mg  650 mg Oral Q6H PRN Madaram, Kondal R, MD       alum & mag hydroxide-simeth (MAALOX/MYLANTA) 200-200-20 MG/5ML suspension 30 mL  30 mL Oral Q4H PRN Madaram, Kondal R, MD       haloperidol  (HALDOL ) tablet 5 mg  5 mg Oral TID PRN Bennett, Christal H, NP       And   diphenhydrAMINE  (BENADRYL ) capsule 50 mg  50 mg Oral TID PRN Bennett, Christal H, NP       haloperidol  lactate (HALDOL ) injection 5 mg  5 mg Intramuscular TID PRN Bennett, Christal H, NP       And   diphenhydrAMINE  (BENADRYL ) injection 50 mg  50 mg Intramuscular TID PRN Bennett, Christal H, NP       And   LORazepam  (ATIVAN ) injection 2 mg  2 mg Intramuscular TID PRN Bennett, Christal H, NP       haloperidol  lactate (HALDOL ) injection 10 mg  10 mg Intramuscular TID PRN Bennett,  Christal H, NP       And   diphenhydrAMINE  (BENADRYL ) injection 50 mg  50 mg Intramuscular TID PRN Bennett, Christal H, NP       And   LORazepam  (ATIVAN ) injection 2 mg  2 mg Intramuscular TID PRN Bennett, Christal H, NP       fluticasone  (FLONASE ) 50 MCG/ACT nasal spray 2 spray  2 spray Each Nare Daily PRN Bennett, Christal H, NP       hydrOXYzine  (ATARAX ) tablet 25 mg  25 mg Oral TID PRN Bennett, Christal H, NP   25 mg at 11/25/24 2138   magnesium  hydroxide (MILK OF MAGNESIA) suspension 30 mL  30 mL Oral Daily PRN Madaram, Kondal R, MD       paliperidone  (INVEGA ) 24 hr tablet 9 mg  9 mg Oral Daily Madaram, Kondal R, MD   9 mg at 11/25/24 0755   traZODone  (DESYREL ) tablet 50 mg  50 mg Oral QHS Madaram, Kondal R, MD   50 mg at 11/25/24 2138   valproic  acid (DEPAKENE ) 250 MG/5ML solution 750 mg  750 mg Oral Daily Madaram, Kondal R, MD   750 mg at 11/25/24 0755   Vitamin D  (Ergocalciferol ) (DRISDOL ) 1.25 MG (50000 UNIT) capsule 50,000 Units  50,000 Units Oral Q7 days Towana Leita SAILOR, MD   50,000 Units at 11/25/24 1354    Lab Results:  Results for orders placed or performed during the hospital encounter of 11/23/24 (from the past 48 hours)  Hemoglobin A1c     Status: None   Collection Time: 11/25/24  6:31 AM  Result Value Ref Range   Hgb A1c MFr Bld 5.6 4.8 - 5.6 %    Comment: (NOTE) Diagnosis of Diabetes The following HbA1c ranges recommended by the American Diabetes Association (ADA) may be used as an aid in the diagnosis of diabetes mellitus.  Hemoglobin             Suggested A1C NGSP%              Diagnosis  <5.7                   Non Diabetic  5.7-6.4                Pre-Diabetic  >6.4                   Diabetic  <7.0                   Glycemic control for                       adults with diabetes.     Mean Plasma Glucose 114.02 mg/dL    Comment: Performed at [facility], [address] [place], [place] [address]  VITAMIN D  25 Hydroxy (Vit-D Deficiency, Fractures)      Status: Abnormal   Collection Time: 11/25/24  6:31 AM  Result Value Ref Range   Vit D, 25-Hydroxy 19.3 (L) 30 - 100 ng/mL    Comment: (NOTE) Vitamin D  deficiency has been defined by the Institute of Medicine  and an Endocrine Society practice guideline as a level of serum 25-OH  vitamin D  less than 20 ng/mL (1,2). The Endocrine Society went on to  further define vitamin D  insufficiency as a level between 21 and 29  ng/mL (2).  1. IOM (Institute of Medicine). 2010. Dietary reference intakes for  calcium and D. [place]: [employer]. 2. Holick MF, Binkley Worthington, Bischoff-Ferrari HA, et al. Evaluation,  treatment, and prevention of vitamin D  deficiency: an Endocrine  Society clinical practice guideline, JCEM. 2011 Jul; 96(7): 1911-30.  Performed at [facility], [address], [place], [place] [address]   Lipid panel     Status: None   Collection Time: 11/25/24  6:31 AM  Result Value Ref Range   Cholesterol 112 0 - 200 mg/dL    Comment:        ATP III CLASSIFICATION:  <200     mg/dL   Desirable  799-760  mg/dL   Borderline High  >=759  mg/dL   High           Triglycerides 19 <150 mg/dL   HDL 46 >59 mg/dL   Total CHOL/HDL Ratio 2.5 RATIO   VLDL 4 0 - 40 mg/dL   LDL Cholesterol 63 0 - 99 mg/dL    Comment:        Total Cholesterol/HDL:CHD Risk Coronary Heart Disease Risk Table                     Men   Women  1/2 Average Risk   3.4   3.3  Average Risk       5.0   4.4  2 X Average Risk   9.6   7.1  3 X Average Risk  23.4   11.0        Use the calculated Patient Ratio above and the CHD Risk Table to determine the patient's CHD Risk.        ATP III CLASSIFICATION (LDL):  <100     mg/dL   Optimal  899-870  mg/dL   Near or Above                    Optimal  130-159  mg/dL   Borderline  839-810  mg/dL   High  >809     mg/dL   Very High Performed at [facility], [address], [place], [place] [address]     Blood Alcohol  level:  Lab Results  Component Value Date   [facility] <15 11/21/2024   ETH <10 09/27/2023    Metabolic Disorder Labs: Lab Results  Component Value Date   HGBA1C 5.6 11/25/2024   MPG 114.02 11/25/2024   MPG 105 08/17/2016   No results found for: PROLACTIN Lab Results  Component Value Date   CHOL 112 11/25/2024   TRIG 19 11/25/2024   HDL 46 11/25/2024   CHOLHDL 2.5 11/25/2024   VLDL 4 11/25/2024   LDLCALC 63 11/25/2024   LDLCALC 119 (H) 07/07/2020    Physical Findings: AIMS:  ,  ,  ,  ,  ,  ,   CIWA:    COWS:      Mental Status exam: Appearance: tall, thin, black male, appropriately groomed, seen wearing casual clothing and walking calmly through the milieu  Eye contact: intense gaze  Attitude towards examiner pleasant, friendly  Psychomotor: no agitation or retardation  Speech: mildly rambling, otherwise largely normal in amount and rate  Language: no delays  Mood: good Affect: restricted, does smile at times  Thought content: denying SI and HI, no overt delusions expressed today  Thought Process: remains mildly tangential  Perception: denying AVH, not RTIS  Insight: limited - poor understanding of diagnosis and consideration for LAI Judgement: limited - refusal for LAI  Orientation: x3 Attention/Concentration: fair to good  Memory/Cognition: not formally assessed; grossly intact  Fund of Knowledge: Average    Musculoskeletal: Strength & Muscle Tone: within normal limits Gait & Station: normal Patient leans: N/A   Physical Exam Constitutional:      Appearance: Normal appearance.  HENT:     Head: Normocephalic and atraumatic.  Eyes:     Extraocular Movements: Extraocular movements intact.  Abdominal:     General: There is no distension.  Musculoskeletal:        General: Normal range of motion.     Cervical back: Normal range of motion.  Neurological:     General: No focal deficit present.  Mental Status: He is alert.    Review of Systems   All other systems reviewed and are negative.  Blood pressure 121/71, pulse (!) 59, temperature 97.6 F (36.4 C), temperature source Oral, resp. rate 16, height 5' 10 (1.778 m), weight 69.9 kg, SpO2 100%. Body mass index is 22.1 kg/m.   Treatment Plan Summary: Daily contact with patient to assess and evaluate symptoms and progress in treatment  Assessment: The patient is a 29 y.o. male with a psychiatric history most consistent with schizophrenia. He has had several years of psychotic symptoms including disorganization in thoughts and actions (bizarre behaviors), delusions, paranoia hallucinations, and negative symptoms including poor self care and decline in functioning. No significant trauma history and no previous or current substance use concerns. Although prior notes document a bipolar disorder no credible history of mania per patient and chart review and similarly no reported episodes of depression per patient. He has never had a suicide attempt.  On this admission patient presents after recurrence of psychotic symptoms and bizarre behavior (out shoeless in the snow, running away, disorganized) after stopping his PO medications. Patient has preserved insight into mental health concerns and need for medicines and has so far been compliant with restarting invega  and depakote . As he had not been off for a significant amount of time these were restated at admission at prior home doses of 9 mg and 750 mg daily respectively. Today he remained mildly tangential with intense gaze, otherwise pleasant and denying concerns. He does have poor insight and judgement related to his diagnosis and has refused LAI. Family has voiced preference for him to be on this prior to discharge, however not currently meeting criteria for involuntary medications. Will continue with this regimen and continue PO medications for now and continue to encourage consideration of LAI.   DSM-5 diagnoses: Schizophrenia   -per ICD  10: schizophrenia, paranoid type   Plan:  Legal Status: -Involuntary: IVC upheld 11/24/24  Safety -q15 minute checks  -elopement, suicide and assault precautions  -daily vitals  Psychiatric Concerns  -Continue Invega  9 mg daily for psychosis -Continue Valproic  acid depakene  solution 750 mg daily  -Trazodone  50 mg at bedtime PRN for sleep -Hydroxyzine  25 mg TID PRN for anxiety -haldol  5mg  + ativan  2 mg + benadryl  50 mg PRN for agitation  Substance use concerns  -None currently   Nicotine Replacement  N/A; does not use tobacco products   Medical concerns Vitamin D  deficiency -per labs today 19.3 -will begin weekly high-dose vitamin D  50 000 U; to be given Thursdays for 12 weeks.   -Flonase  for seasonal allergies  Additional PRNs: -Tylenol  tablets 650 mg every 6 hours as needed for pain -Maalox/Mylanta suspension 30 mL every 4 hours as needed for indigestion  -Milk of Magnesia 30 mL daily as needed for constipation  Labs 1/22 -A1c 5.6 and lipid panel WNL -Vitamin D  low (19.3)  11/21/24 -- CBC: Unremarkable -- CMP: Blood glucose 106, otherwise normal -- Ethanol: <15 -- UDS: Negative -- QT/QTc: 422/365  Psychosocial interventions  -daily medication management with psychiatry -Medication education regarding risks/benefits and alternatives -bedside psychotherapy as indicated  -Patient will be encouraged to participate and engage with group therapy  -Appreciate SW assistance in coordinating safe disposition  -Anticipated LOS 4-6 days   Leita LOISE Arts, MD 11/26/2024, 8:27 AM

## 2024-11-26 NOTE — Group Note (Signed)
 Date:  11/26/2024 Time:  11:48 AM  Music therapy  Participation Level:  Active  Participation Quality:  Appropriate  Affect:  Appropriate  Cognitive:  Appropriate  Insight: Appropriate  Engagement in Group:  Engaged  Modes of Intervention:  Socialization  Additional Comments:    Leonard Davidson Sierra 11/26/2024, 11:48 AM

## 2024-11-26 NOTE — Group Note (Signed)
 Date:  11/26/2024 Time:  3:57 PM  Group Topic/Focus:  Wellness Toolbox:   The focus of this group is to discuss various aspects of wellness, balancing those aspects and exploring ways to increase the ability to experience wellness.  Patients will create a wellness toolbox for use upon discharge.    Participation Level:  Active  Participation Quality:  Appropriate and Attentive  Affect:  Appropriate  Cognitive:  Alert and Appropriate  Insight: Appropriate and Good  Engagement in Group:  Engaged and Supportive  Modes of Intervention:  Activity and Socialization    Leonard Davidson Lowers 11/26/2024, 3:57 PM

## 2024-11-27 NOTE — Group Note (Signed)
 Date:  11/27/2024 Time:  4:13 PM  Group Topic/Focus:  Coping With Mental Health Crisis:   The purpose of this group is to help patients identify strategies for coping with mental health crisis.  Group discusses possible causes of crisis and ways to manage them effectively. Overcoming Stress:   The focus of this group is to define stress and help patients assess their triggers.    Participation Level:  Active  Participation Quality:  Appropriate  Affect:  Appropriate  Cognitive:  Appropriate  Insight: Appropriate  Engagement in Group:  Engaged  Modes of Intervention:  Activity  Additional Comments:    [name] Leonard Davidson 11/27/2024, 4:13 PM

## 2024-11-27 NOTE — Plan of Care (Signed)
  Problem: Education: Goal: Mental status will improve Outcome: Progressing   

## 2024-11-27 NOTE — Group Note (Signed)
 Date:  11/27/2024 Time:  8:50 PM  Group Topic/Focus:  Wrap-Up Group:   The focus of this group is to help patients review their daily goal of treatment and discuss progress on daily workbooks.    Participation Level:  Active  Participation Quality:  Appropriate  Affect:  Appropriate  Cognitive:  Appropriate  Insight: Appropriate  Engagement in Group:  Engaged  Modes of Intervention:  Education  Additional Comments:  Patient attended and participated in group tonight. He reports that he like being different from everyone. He like setting the trend. Like being a occupational hygienist. What he would change about himself is watching what he do or say more. He want to do unto others as he would like them to do unto him.  Gwenn Chillington Dacosta 11/27/2024, 8:50 PM

## 2024-11-27 NOTE — Group Note (Signed)
 LCSW Group Therapy Note  Group Date: 11/27/2024 Start Time: 1030 End Time: 1130   Type of Therapy and Topic:  Group Therapy: Anger Cues and Responses  Participation Level:  Active   Description of Group:   In this group, patients learned how to recognize the physical, cognitive, emotional, and behavioral responses they have to anger-provoking situations.  They identified a recent time they became angry and how they reacted.  They analyzed how their reaction was possibly beneficial and how it was possibly unhelpful.  The group discussed a variety of healthier coping skills that could help with such a situation in the future.  Focus was placed on how helpful it is to recognize the underlying emotions to our anger, because working on those can lead to a more permanent solution as well as our ability to focus on the important rather than the urgent.  Therapeutic Goals: Patients will remember their last incident of anger and how they felt emotionally and physically, what their thoughts were at the time, and how they behaved. Patients will identify how their behavior at that time worked for them, as well as how it worked against them. Patients will explore possible new behaviors to use in future anger situations. Patients will learn that anger itself is normal and cannot be eliminated, and that healthier reactions can assist with resolving conflict rather than worsening situations.  Summary of Patient Progress:  Leonard Davidson was active during the group. He shared a recent occurrence wherein feeling fake people led to anger. He demonstrated good insight into the subject matter, was respectful of peers, and participated throughout the entire session.  Therapeutic Modalities:   Cognitive Behavioral Therapy    Leonard Davidson 11/27/2024  6:14 PM

## 2024-11-27 NOTE — [facility] Group Notes (Signed)
 Adult Psychoeducational Group Note  Date:  11/27/2024 Time:  10:15 AM  Group Topic/Focus:  Goals Group:   The focus of this group is to help patients establish daily goals to achieve during treatment and discuss how the patient can incorporate goal setting into their daily lives to aide in recovery.  Participation Level:  Did Not Attend  Participation Quality:    Affect:    Cognitive:    Insight:   Engagement in Group:    Modes of Intervention:    Additional Comments:    [name] 11/27/2024, 10:15 AM

## 2024-11-27 NOTE — [facility] Group Notes (Signed)
 Adult Psychoeducational Group Note  Date:  11/27/2024 Time:  3:08 PM  Group Topic/Focus: Intellectual Wellness  Participation Level:  Active  Participation Quality:    Affect:    Cognitive:    Insight:   Engagement in Group:    Modes of Intervention:    Additional Comments:    Tawny Raspberry O 11/27/2024, 3:08 PM

## 2024-11-27 NOTE — Plan of Care (Signed)

## 2024-11-27 NOTE — Progress Notes (Signed)
" °   11/27/24 1000  Psych Admission Type (Psych Patients Only)  Admission Status Involuntary  Psychosocial Assessment  Patient Complaints None  Eye Contact Fair  Facial Expression Animated  Affect Appropriate to circumstance  Speech Logical/coherent  Interaction Assertive  Motor Activity Slow  Appearance/Hygiene Unremarkable  Behavior Characteristics Appropriate to situation  Mood Pleasant  Thought Process  Coherency WDL  Content WDL  Delusions None reported or observed  Perception WDL  Hallucination None reported or observed  Judgment Limited  Confusion None  Danger to Self  Current suicidal ideation? Denies  Danger to Others  Danger to Others None reported or observed    "

## 2024-11-27 NOTE — Progress Notes (Addendum)
 [facility] MD Progress Note  11/27/2024 8:09 AM Leonard Davidson  MRN:  [id number]  Principal Problem: Schizophrenia, paranoid (HCC) Diagnosis: Principal Problem:   Schizophrenia, paranoid (HCC) Active Problems:   Seasonal allergies  Total Time spent with patient:  I personally spent 30 minutes on the unit in direct patient care. The direct patient care time included face-to-face time with the patient, reviewing the patient's chart, communicating with other professionals, and coordinating care.   Identifying Information and Past Psychiatric History:  The patient is a 29 y.o. male (domiciled with grandmother, not employed, receives Industrial/product Designer) with no significant medical history and a psychiatric history most consistent with schizophrenia. He is currently admitted under IVC for psychosis: experiencing increased paranoia, and had eloped from home wearing only a T-shirt and shorts without shoes in cold, snowy weather.  Psychiatric history is notable for prior diagnoses of schizophrenia and bipolar disorder (although no clear evidence of frank manic episode on chart review). He has no prior suicide attempts and has denied significant affective concerns (denies depressive symptoms). He has numerous prior psychiatric hospitalizations for psychotic symptoms, most recently November 2024; prior to that in 2017. Admissions are for psychosis including disorganization in thoughts and behaviors, hallucinations, paranoia, and delusional content as well as negative symptoms including poor self care. No current or prior significant substance use concerns. He has previously been on LAI, but more recently has been on a combination of PO risperidone  and depakote  DR. Patient has [employer] in [place].   Interval events: Patient was documented to attend most groups with good and appropriate engagement. Overall pleasant and logical on unit. Denying SI, hI and AVH. Slept 5.25 hrs overnight. BP (!)  111/57 (BP Location: Right Arm)   Pulse (!) 52   Temp 98.2 F (36.8 C) (Oral)   Resp 16   Ht 5' 10 (1.778 m)   Wt 69.9 kg   SpO2 100%   BMI 22.10 kg/m   Interview today: Today the patient states he is good. Denies issues with sleep and states he had a nap last evening before he actually went to bed so had at least 6 hrs. Reports he typically gets 6-7 hours at home and does not feel that his sleep is any worse. States he did talk to his family about the LAI but continues to voice preference against it, saying he hates needles and found it too sedating when he was on it before. Reports he and his family will have a family meeting when he goes home about it, and that he is open to having family watch him take his pills, but is not open to injection at this time. Patient states he just wants to live a normal life and plans for after the hospital include working out and staying busy with work. Today denies SI, HI and AVH. Does not voice additional concerns.   Collateral: Leonard Davidson (mother) [phone] -Called this morning. They speak every day on the phone. She feels that he has gotten better, but she always worries that he says what he needs to in order to get out of the hospital. But he definitely sounds better. Maybe not 100% back at baseline, but getting close. Discussed with mom that he is continuing to refuse LAI and unlikely to be able to give it to him given improvements and no indication for involuntary meds. She voiced understanding. Feels that by Monday he will likely be fine to come home on the pills, weather permitting she  will pick him up then.     Past Medical History:  Past Medical History:  Diagnosis Date   Bipolar 1 disorder (HCC)    Seasonal allergies     Past Surgical History:  Procedure Laterality Date   NO PAST SURGERIES     Family History:  Family History  Problem Relation Age of Onset   Diabetes Father    Family Psychiatric  History: bipolar disorder  in grandfather  Social History:  Social History   Substance and Sexual Activity  Alcohol Use Not Currently   Comment: sometimes     Social History   Substance and Sexual Activity  Drug Use Not Currently    Social History   Socioeconomic History   Marital status: Single    Spouse name: Not on file   Number of children: Not on file   Years of education: Not on file   Highest education level: Not on file  Occupational History   Not on file  Tobacco Use   Smoking status: Never   Smokeless tobacco: Never  Vaping Use   Vaping status: Never Used  Substance and Sexual Activity   Alcohol use: Not Currently    Comment: sometimes   Drug use: Not Currently   Sexual activity: Yes  Other Topics Concern   Not on file  Social History Narrative   Not on file   Social Drivers of Health   Tobacco Use: Low Risk (11/23/2024)   Patient History    Smoking Tobacco Use: Never    Smokeless Tobacco Use: Never    Passive Exposure: Not on file  Financial Resource Strain: Not on file  Food Insecurity: No Food Insecurity (11/23/2024)   Epic    Worried About Programme Researcher, Broadcasting/film/video in the Last Year: Never true    Ran Out of Food in the Last Year: Never true  Transportation Needs: No Transportation Needs (11/23/2024)   Epic    Lack of Transportation (Medical): No    Lack of Transportation (Non-Medical): No  Physical Activity: Not on file  Stress: Not on file  Social Connections: Not on file  Depression (EYV7-0): Not on file  Alcohol Screen: Low Risk (11/23/2024)   Alcohol Screen    Last Alcohol Screening Score (AUDIT): 0  Housing: Low Risk (11/23/2024)   Epic    Unable to Pay for Housing in the Last Year: No    Number of Times Moved in the Last Year: 0    Homeless in the Last Year: No  Utilities: Not At Risk (11/23/2024)   Epic    Threatened with loss of utilities: No  Health Literacy: Not on file    Current Medications: Current Facility-Administered Medications  Medication Dose Route  Frequency Provider Last Rate Last Admin   acetaminophen  (TYLENOL ) tablet 650 mg  650 mg Oral Q6H PRN Madaram, Kondal R, MD       alum & mag hydroxide-simeth (MAALOX/MYLANTA) 200-200-20 MG/5ML suspension 30 mL  30 mL Oral Q4H PRN Madaram, Kondal R, MD       haloperidol  (HALDOL ) tablet 5 mg  5 mg Oral TID PRN Bennett, Christal H, NP       And   diphenhydrAMINE  (BENADRYL ) capsule 50 mg  50 mg Oral TID PRN Bennett, Christal H, NP       haloperidol  lactate (HALDOL ) injection 5 mg  5 mg Intramuscular TID PRN Bennett, Christal H, NP       And   diphenhydrAMINE  (BENADRYL ) injection 50 mg  50 mg  Intramuscular TID PRN Bennett, Christal H, NP       And   LORazepam  (ATIVAN ) injection 2 mg  2 mg Intramuscular TID PRN Bennett, Christal H, NP       haloperidol  lactate (HALDOL ) injection 10 mg  10 mg Intramuscular TID PRN Bennett, Christal H, NP       And   diphenhydrAMINE  (BENADRYL ) injection 50 mg  50 mg Intramuscular TID PRN Bennett, Christal H, NP       And   LORazepam  (ATIVAN ) injection 2 mg  2 mg Intramuscular TID PRN Bennett, Christal H, NP       fluticasone  (FLONASE ) 50 MCG/ACT nasal spray 2 spray  2 spray Each Nare Daily PRN Bennett, Christal H, NP       hydrOXYzine  (ATARAX ) tablet 25 mg  25 mg Oral TID PRN Bennett, Christal H, NP   25 mg at 11/26/24 2154   magnesium  hydroxide (MILK OF MAGNESIA) suspension 30 mL  30 mL Oral Daily PRN Madaram, Kondal R, MD       paliperidone  (INVEGA ) 24 hr tablet 9 mg  9 mg Oral Daily Madaram, Kondal R, MD   9 mg at 11/27/24 0749   traZODone  (DESYREL ) tablet 50 mg  50 mg Oral QHS Madaram, Kondal R, MD   50 mg at 11/26/24 2154   valproic  acid (DEPAKENE ) 250 MG/5ML solution 750 mg  750 mg Oral Daily Madaram, Kondal R, MD   750 mg at 11/27/24 0749   Vitamin D  (Ergocalciferol ) (DRISDOL ) 1.25 MG (50000 UNIT) capsule 50,000 Units  50,000 Units Oral Q7 days Towana Leita SAILOR, MD   50,000 Units at 11/25/24 1354    Lab Results:  No results found for this or any previous visit  (from the past 48 hours).   Blood Alcohol level:  Lab Results  Component Value Date   [facility] <15 11/21/2024   ETH <10 09/27/2023    Metabolic Disorder Labs: Lab Results  Component Value Date   HGBA1C 5.6 11/25/2024   MPG 114.02 11/25/2024   MPG 105 08/17/2016   No results found for: PROLACTIN Lab Results  Component Value Date   CHOL 112 11/25/2024   TRIG 19 11/25/2024   HDL 46 11/25/2024   CHOLHDL 2.5 11/25/2024   VLDL 4 11/25/2024   LDLCALC 63 11/25/2024   LDLCALC 119 (H) 07/07/2020    Physical Findings: AIMS:  ,  ,  ,  ,  ,  ,   CIWA:    COWS:     Mental Status exam: Appearance: tall, thin, black male, appropriately groomed, seen wearing casual clothing and walking calmly through the milieu  Eye contact: intense gaze, mildly improved   Attitude towards examiner pleasant, friendly  Psychomotor: no agitation or retardation  Speech: mildly rambling, otherwise largely normal in amount and rate  Language: no delays  Mood: good Affect: restricted, does smile at times  Thought content: denying SI and HI, no overt delusions expressed today  Thought Process: remains mildly tangential but improving Perception: denying AVH, not RTIS  Insight: limited - poor understanding of diagnosis and consideration for LAI Judgement: limited - refusal for LAI  Orientation: x3 Attention/Concentration: fair to good  Memory/Cognition: not formally assessed; grossly intact  Fund of Knowledge: Average    Musculoskeletal: Strength & Muscle Tone: within normal limits Gait & Station: normal Patient leans: N/A   Physical Exam Constitutional:      Appearance: Normal appearance.  HENT:     Head: Normocephalic and atraumatic.  Eyes:  Extraocular Movements: Extraocular movements intact.  Abdominal:     General: There is no distension.  Musculoskeletal:        General: Normal range of motion.     Cervical back: Normal range of motion.  Neurological:     General: No focal  deficit present.     Mental Status: He is alert.    Review of Systems  All other systems reviewed and are negative.  Blood pressure (!) 111/57, pulse (!) 52, temperature 98.2 F (36.8 C), temperature source Oral, resp. rate 16, height 5' 10 (1.778 m), weight 69.9 kg, SpO2 100%. Body mass index is 22.1 kg/m.   Treatment Plan Summary: Daily contact with patient to assess and evaluate symptoms and progress in treatment  Assessment: The patient is a 29 y.o. male with a psychiatric history most consistent with schizophrenia. He has had several years of psychotic symptoms including disorganization in thoughts and actions (bizarre behaviors), delusions, paranoia hallucinations, and negative symptoms including poor self care and decline in functioning. No significant trauma history and no previous or current substance use concerns. Although prior notes document a bipolar disorder no credible history of mania per patient and chart review and similarly no reported episodes of depression per patient. He has never had a suicide attempt.  On this admission patient presents after recurrence of psychotic symptoms and bizarre behavior (out shoeless in the snow, running away, disorganized) after stopping his PO medications. Patient has preserved insight into mental health concerns and need for medicines and has so far been compliant with restarting invega  and depakote . As he had not been off for a significant amount of time these were restated at admission at prior home doses of 9 mg and 750 mg daily respectively. Today he remained mildly tangential; gaze still intense but a bit improved. Continues to express poor insight into condition and recommendation for LAI (given history of poor outpatient adherence) however not currently meeting criteria for involuntary medications. Will continue with this regimen and continue PO medications for now and continue to encourage consideration of LAI. If continues to disagree  through the weekend, will likely discharge early next week on PO medications.   DSM-5 diagnoses: Schizophrenia   -per ICD 10: schizophrenia, paranoid type   Plan:  Legal Status: -Involuntary: IVC upheld 11/24/24  Safety -q15 minute checks  -elopement, suicide and assault precautions  -daily vitals  Psychiatric Concerns  -Continue Invega  9 mg daily for psychosis -Continue Valproic  acid depakene  solution 750 mg daily  -Trazodone  50 mg at bedtime PRN for sleep -Hydroxyzine  25 mg TID PRN for anxiety -haldol  5mg  + ativan  2 mg + benadryl  50 mg PRN for agitation  Substance use concerns  -None currently   Nicotine Replacement  N/A; does not use tobacco products   Medical concerns Vitamin D  deficiency -per labs today 19.3 -will begin weekly high-dose vitamin D  50 000 U; to be given Thursdays for 12 weeks.   -Flonase  for seasonal allergies  Additional PRNs: -Tylenol  tablets 650 mg every 6 hours as needed for pain -Maalox/Mylanta suspension 30 mL every 4 hours as needed for indigestion  -Milk of Magnesia 30 mL daily as needed for constipation  Labs 1/22 -A1c 5.6 and lipid panel WNL -Vitamin D  low (19.3)  11/21/24 -- CBC: Unremarkable -- CMP: Blood glucose 106, otherwise normal -- Ethanol: <15 -- UDS: Negative -- QT/QTc: 422/365  Psychosocial interventions  -daily medication management with psychiatry -Medication education regarding risks/benefits and alternatives -bedside psychotherapy as indicated  -Patient will be  encouraged to participate and engage with group therapy  -Appreciate SW assistance in coordinating safe disposition  -Anticipated LOS 3 more days   Leita LOISE Arts, MD 11/27/2024, 8:09 AM

## 2024-11-27 NOTE — Group Note (Signed)
 LCSW Group Therapy Note  Group Date: 11/27/2024 Start Time: 1000 End Time: 1100   Type of Therapy and Topic:  Group Therapy - Healthy vs Unhealthy Coping Skills  Participation Level:  Did Not Attend   Description of Group The focus of this group was to determine what unhealthy coping techniques typically are used by group members and what healthy coping techniques would be helpful in coping with various problems. Patients were guided in becoming aware of the differences between healthy and unhealthy coping techniques. Patients were asked to identify 2-3 healthy coping skills they would like to learn to use more effectively.  Therapeutic Goals Patients learned that coping is what human beings do all day long to deal with various situations in their lives Patients defined and discussed healthy vs unhealthy coping techniques Patients identified their preferred coping techniques and identified whether these were healthy or unhealthy Patients determined 2-3 healthy coping skills they would like to become more familiar with and use more often. Patients provided support and ideas to each other   Summary of Patient Progress:  NA Therapeutic Modalities Cognitive Behavioral Therapy Motivational Interviewing  Evalene MALVA Quale, LCSWA 11/27/2024  12:14 PM

## 2024-11-27 NOTE — Plan of Care (Signed)
   Problem: Education: Goal: Knowledge of [facility] Education information/materials will improve Outcome: Progressing Goal: Emotional status will improve Outcome: Progressing Goal: Mental status will improve Outcome: Progressing Goal: Verbalization of understanding the information provided will improve Outcome: Progressing

## 2024-11-27 NOTE — Progress Notes (Signed)
(  Sleep Hours) -5.25 (Any PRNs that were needed, meds refused, or side effects to meds)- Trazodone  and Atarax . (Any disturbances and when (visitation, over night)-none (Concerns raised by the patient)- none (SI/HI/AVH)-denied

## 2024-11-27 NOTE — [facility] Group Notes (Signed)
 Adult Psychoeducational Group Note  Date:  11/27/2024 Time:  3:08 PM  Group Topic/Focus: Occupational Therapy  Participation Level:  Active  Participation Quality:    Affect:    Cognitive:    Insight:   Engagement in Group:    Modes of Intervention:    Additional Comments:    Zalma Channing O 11/27/2024, 3:08 PM

## 2024-11-27 NOTE — [facility] Group Notes (Signed)
 Adult Psychoeducational Group Note  Date:  11/27/2024 Time:  3:07 PM  Group Topic/Focus: Social Work Group  Participation Level:  Active  Participation Quality:    Affect:    Cognitive:    Insight:   Engagement in Group:    Modes of Intervention:    Additional Comments:    Jamelle Cassondra KIDD 11/27/2024, 3:07 PM

## 2024-11-28 NOTE — Group Note (Signed)
 Occupational Therapy Group Note  Group Topic:Coping Skills  Group Date: 11/27/2024 Start Time: 1500 End Time: 1530 Facilitators: Dot [place] MATSU, OT   Group Description: Group encouraged increased engagement and participation through discussion and activity focused on Coping Ahead. Patients were split up into teams and selected a card from a stack of positive coping strategies. Patients were instructed to act out/charade the coping skill for other peers to guess and receive points for their team. Discussion followed with a focus on identifying additional positive coping strategies and patients shared how they were going to cope ahead over the weekend while continuing hospitalization stay.  Therapeutic Goal(s): Identify positive vs negative coping strategies. Identify coping skills to be used during hospitalization vs coping skills outside of hospital/at home Increase participation in therapeutic group environment and promote engagement in treatment   Participation Level: Engaged   Participation Quality: Independent   Behavior: Appropriate   Speech/Thought Process: Relevant   Affect/Mood: Flat   Insight: Fair   Judgement: Fair      Modes of Intervention: Education  Patient Response to Interventions:  Attentive   Plan: Continue to engage patient in OT groups 2 - 3x/week.  11/28/2024  [place] MATSU Dot, OT  Leonard Davidson, OT

## 2024-11-28 NOTE — [facility] Group Notes (Signed)
 Adult Psychoeducational Group Note  Date:  11/28/2024 Time:  3:40 PM  Group Topic/Focus:  Developing a Wellness Toolbox:   The focus of this group is to help patients develop a wellness toolbox with skills and strategies to promote recovery upon discharge.  Participation Level:  Active  Participation Quality:  Appropriate  Affect:  Appropriate  Cognitive:  Appropriate  Insight: Good  Engagement in Group:  Engaged  Modes of Intervention:  Discussion  Additional Comments:    [name] 11/28/2024, 3:40 PM

## 2024-11-28 NOTE — Plan of Care (Signed)
   Problem: Education: Goal: Emotional status will improve Outcome: Progressing Goal: Mental status will improve Outcome: Progressing

## 2024-11-28 NOTE — [facility] Group Notes (Signed)
 Adult Psychoeducational Group Note  Date:  11/28/2024 Time:  9:18 PM  Group Topic/Focus:  Wrap-Up Group:   The focus of this group is to help patients review their daily goal of treatment and discuss progress on daily workbooks.  Participation Level:  Active  Participation Quality:  Appropriate  Affect:  Appropriate  Cognitive:  Appropriate  Insight: Good  Engagement in Group:  Engaged  Modes of Intervention:  Discussion  Additional Comments:    [name] 11/28/2024, 9:18 PM

## 2024-11-28 NOTE — Progress Notes (Signed)
 D. Pt has been appropriate during interactions- visible in the milieu, observed attending groups. Pt denied SI/HI and A/VH and doesn't appear to be responding to internal stimuli. A. Labs and vitals monitored. Pt given and educated on medications. Pt supported emotionally and encouraged to express concerns and ask questions.   R. Pt remains safe with 15 minute checks. Will continue POC.

## 2024-11-28 NOTE — Plan of Care (Signed)
   Problem: Education: Goal: Knowledge of [facility] Education information/materials will improve Outcome: Progressing Goal: Emotional status will improve Outcome: Progressing Goal: Mental status will improve Outcome: Progressing Goal: Verbalization of understanding the information provided will improve Outcome: Progressing

## 2024-11-28 NOTE — Progress Notes (Signed)
(  Sleep Hours) -5.5 (Any PRNs that were needed, meds refused, or side effects to meds)- hydroxyzine  (Any disturbances and when (visitation, over night)- none reported (Concerns raised by the patient)- none reported (SI/HI/AVH)- Denies all

## 2024-11-28 NOTE — [facility] Group Notes (Signed)
 Adult Psychoeducational Group Note  Date:  11/28/2024 Time:  9:22 AM  Group Topic/Focus:  Goals Group:   The focus of this group is to help patients establish daily goals to achieve during treatment and discuss how the patient can incorporate goal setting into their daily lives to aide in recovery.  Participation Level:  Active  Participation Quality:  Attentive  Affect:  Appropriate  Cognitive:  Appropriate  Insight: Good  Engagement in Group:  Engaged  Modes of Intervention:  Discussion  Additional Comments:    [name] 11/28/2024, 9:22 AM

## 2024-11-28 NOTE — [facility] Group Notes (Signed)
 Adult Psychoeducational Group Note  Date:  11/28/2024 Time:  4:32 PM  Group Topic/Focus:  Wellness Toolbox:   The focus of this group is to discuss various aspects of wellness, balancing those aspects and exploring ways to increase the ability to experience wellness.  Patients will create a wellness toolbox for use upon discharge.  Participation Level:  Active  Participation Quality:  Attentive  Affect:  Appropriate  Cognitive:  Appropriate  Insight: Good  Engagement in Group:  Engaged  Modes of Intervention:  Discussion  Additional Comments:    [name] 11/28/2024, 4:32 PM

## 2024-11-28 NOTE — [facility] Group Notes (Signed)
 Adult Psychoeducational Group Note  Date:  11/28/2024 Time:  4:33 PM  Group Topic/Focus:  Wellness Toolbox:   The focus of this group is to discuss various aspects of wellness, balancing those aspects and exploring ways to increase the ability to experience wellness.  Patients will create a wellness toolbox for use upon discharge.  Participation Level:  Active  Participation Quality:  Attentive  Affect:  Appropriate  Cognitive:  Appropriate  Insight: Good  Engagement in Group:  Engaged  Modes of Intervention:  Discussion  Additional Comments:    [name] 11/28/2024, 4:33 PM

## 2024-11-28 NOTE — Progress Notes (Signed)
 [facility] MD Progress Note  11/28/2024 7:42 AM Leonard Davidson  MRN:  [id number]  Principal Problem: Schizophrenia, paranoid (HCC) Diagnosis: Principal Problem:   Schizophrenia, paranoid (HCC) Active Problems:   Seasonal allergies  Total Time spent with patient:  I personally spent 30 minutes on the unit in direct patient care. The direct patient care time included face-to-face time with the patient, reviewing the patient's chart, communicating with other professionals, and coordinating care.   Identifying Information and Past Psychiatric History:  The patient is a 29 y.o. male (domiciled with grandmother, not employed, receives Industrial/product Designer) with no significant medical history and a psychiatric history most consistent with schizophrenia. He is currently admitted under IVC for psychosis: experiencing increased paranoia, and had eloped from home wearing only a T-shirt and shorts without shoes in cold, snowy weather.  Psychiatric history is notable for prior diagnoses of schizophrenia and bipolar disorder (although no clear evidence of frank manic episode on chart review). He has no prior suicide attempts and has denied significant affective concerns (denies depressive symptoms). He has numerous prior psychiatric hospitalizations for psychotic symptoms, most recently November 2024; prior to that in 2017. Admissions are for psychosis including disorganization in thoughts and behaviors, hallucinations, paranoia, and delusional content as well as negative symptoms including poor self care. No current or prior significant substance use concerns. He has previously been on LAI, but more recently has been on a combination of PO risperidone  and depakote  DR. Patient has [employer] in [place].   Interval events: Patient was documented to attend most groups with good and appropriate engagement. Overall pleasant and logical on unit. Denying SI, HI and AVH. Slept 5.5 hrs overnight. BP (!)  111/57 (BP Location: Right Arm)   Pulse (!) 52   Temp 98.2 F (36.8 C) (Oral)   Resp 16   Ht 5' 10 (1.778 m)   Wt 69.9 kg   SpO2 100%   BMI 22.10 kg/m   Interview today: Today the patient states he is good, just really want to get out of here. Reminded patient that the plan is for tomorrow. He is worried he won't actually get to leave as his mom told him on the phone that she will not come if the weather is too poor. Discussed with patient plan is still for tomorrow but we will take it as it comes with the snow. He otherwise reports good sleep, appetite and energy; denies SI, HI and AVH. No concerns voiced. Continues to decline LAI.    Collateral: Tye Finder (mother) [phone] -Called 1/24 They speak every day on the phone. She feels that he has gotten better, but she always worries that he says what he needs to in order to get out of the hospital. But he definitely sounds better. Maybe not 100% back at baseline, but getting close. Discussed with mom that he is continuing to refuse LAI and unlikely to be able to give it to him given improvements and no indication for involuntary meds. She voiced understanding. Feels that by Monday he will likely be fine to come home on the pills, weather permitting she will pick him up then.     Past Medical History:  Past Medical History:  Diagnosis Date   Bipolar 1 disorder (HCC)    Seasonal allergies     Past Surgical History:  Procedure Laterality Date   NO PAST SURGERIES     Family History:  Family History  Problem Relation Age of Onset   Diabetes  Father    Family Psychiatric  History: bipolar disorder in grandfather  Social History:  Social History   Substance and Sexual Activity  Alcohol Use Not Currently   Comment: sometimes     Social History   Substance and Sexual Activity  Drug Use Not Currently    Social History   Socioeconomic History   Marital status: Single    Spouse name: Not on file   Number of children:  Not on file   Years of education: Not on file   Highest education level: Not on file  Occupational History   Not on file  Tobacco Use   Smoking status: Never   Smokeless tobacco: Never  Vaping Use   Vaping status: Never Used  Substance and Sexual Activity   Alcohol use: Not Currently    Comment: sometimes   Drug use: Not Currently   Sexual activity: Yes  Other Topics Concern   Not on file  Social History Narrative   Not on file   Social Drivers of Health   Tobacco Use: Low Risk (11/23/2024)   Patient History    Smoking Tobacco Use: Never    Smokeless Tobacco Use: Never    Passive Exposure: Not on file  Financial Resource Strain: Not on file  Food Insecurity: No Food Insecurity (11/23/2024)   Epic    Worried About Programme Researcher, Broadcasting/film/video in the Last Year: Never true    Ran Out of Food in the Last Year: Never true  Transportation Needs: No Transportation Needs (11/23/2024)   Epic    Lack of Transportation (Medical): No    Lack of Transportation (Non-Medical): No  Physical Activity: Not on file  Stress: Not on file  Social Connections: Not on file  Depression (EYV7-0): Not on file  Alcohol Screen: Low Risk (11/23/2024)   Alcohol Screen    Last Alcohol Screening Score (AUDIT): 0  Housing: Low Risk (11/23/2024)   Epic    Unable to Pay for Housing in the Last Year: No    Number of Times Moved in the Last Year: 0    Homeless in the Last Year: No  Utilities: Not At Risk (11/23/2024)   Epic    Threatened with loss of utilities: No  Health Literacy: Not on file    Current Medications: Current Facility-Administered Medications  Medication Dose Route Frequency Provider Last Rate Last Admin   acetaminophen  (TYLENOL ) tablet 650 mg  650 mg Oral Q6H PRN Madaram, Kondal R, MD       alum & mag hydroxide-simeth (MAALOX/MYLANTA) 200-200-20 MG/5ML suspension 30 mL  30 mL Oral Q4H PRN Madaram, Kondal R, MD       haloperidol  (HALDOL ) tablet 5 mg  5 mg Oral TID PRN Bennett, Christal H, NP        And   diphenhydrAMINE  (BENADRYL ) capsule 50 mg  50 mg Oral TID PRN Bennett, Christal H, NP       haloperidol  lactate (HALDOL ) injection 5 mg  5 mg Intramuscular TID PRN Bennett, Christal H, NP       And   diphenhydrAMINE  (BENADRYL ) injection 50 mg  50 mg Intramuscular TID PRN Bennett, Christal H, NP       And   LORazepam  (ATIVAN ) injection 2 mg  2 mg Intramuscular TID PRN Bennett, Christal H, NP       haloperidol  lactate (HALDOL ) injection 10 mg  10 mg Intramuscular TID PRN Bennett, Christal H, NP       And   diphenhydrAMINE  (BENADRYL )  injection 50 mg  50 mg Intramuscular TID PRN Bennett, Christal H, NP       And   LORazepam  (ATIVAN ) injection 2 mg  2 mg Intramuscular TID PRN Bennett, Christal H, NP       fluticasone  (FLONASE ) 50 MCG/ACT nasal spray 2 spray  2 spray Each Nare Daily PRN Bennett, Christal H, NP       hydrOXYzine  (ATARAX ) tablet 25 mg  25 mg Oral TID PRN Bennett, Christal H, NP   25 mg at 11/27/24 2159   magnesium  hydroxide (MILK OF MAGNESIA) suspension 30 mL  30 mL Oral Daily PRN Madaram, Kondal R, MD       paliperidone  (INVEGA ) 24 hr tablet 9 mg  9 mg Oral Daily Madaram, Kondal R, MD   9 mg at 11/28/24 0725   traZODone  (DESYREL ) tablet 50 mg  50 mg Oral QHS Madaram, Kondal R, MD   50 mg at 11/27/24 2159   valproic  acid (DEPAKENE ) 250 MG/5ML solution 750 mg  750 mg Oral Daily Madaram, Kondal R, MD   750 mg at 11/28/24 0725   Vitamin D  (Ergocalciferol ) (DRISDOL ) 1.25 MG (50000 UNIT) capsule 50,000 Units  50,000 Units Oral Q7 days Towana Leita SAILOR, MD   50,000 Units at 11/25/24 1354    Lab Results:  No results found for this or any previous visit (from the past 48 hours).   Blood Alcohol level:  Lab Results  Component Value Date   [facility] <15 11/21/2024   ETH <10 09/27/2023    Metabolic Disorder Labs: Lab Results  Component Value Date   HGBA1C 5.6 11/25/2024   MPG 114.02 11/25/2024   MPG 105 08/17/2016   No results found for: PROLACTIN Lab Results  Component  Value Date   CHOL 112 11/25/2024   TRIG 19 11/25/2024   HDL 46 11/25/2024   CHOLHDL 2.5 11/25/2024   VLDL 4 11/25/2024   LDLCALC 63 11/25/2024   LDLCALC 119 (H) 07/07/2020    Physical Findings: AIMS:  ,  ,  ,  ,  ,  ,   CIWA:    COWS:     Mental Status exam: Appearance: tall, thin, black male, appropriately groomed, seen wearing casual clothing and sitting in alcove  Eye contact: good - less intense  Attitude towards examiner pleasant, friendly  Psychomotor: no agitation or retardation  Speech: mildly rambling, otherwise largely normal in amount and rate  Language: no delays  Mood: good, everything is good, I just want to leave Affect: restricted, does smile at times  Thought content: denying SI and HI, no overt delusions expressed today  Thought Process: largely linear/organized today Perception: denying AVH, not RTIS  Insight: limited - poor understanding of diagnosis and consideration for LAI Judgement: limited - refusal for LAI  Orientation: x3 Attention/Concentration: fair to good  Memory/Cognition: not formally assessed; grossly intact  Fund of Knowledge: Average    Musculoskeletal: Strength & Muscle Tone: within normal limits Gait & Station: normal Patient leans: N/A   Physical Exam Constitutional:      Appearance: Normal appearance.  HENT:     Head: Normocephalic and atraumatic.  Eyes:     Extraocular Movements: Extraocular movements intact.  Abdominal:     General: There is no distension.  Musculoskeletal:        General: Normal range of motion.     Cervical back: Normal range of motion.  Neurological:     General: No focal deficit present.     Mental Status: He  is alert.    Review of Systems  All other systems reviewed and are negative.  Blood pressure (!) 111/57, pulse (!) 52, temperature 98.2 F (36.8 C), temperature source Oral, resp. rate 16, height 5' 10 (1.778 m), weight 69.9 kg, SpO2 100%. Body mass index is 22.1 kg/m.   Treatment  Plan Summary: Daily contact with patient to assess and evaluate symptoms and progress in treatment  Assessment: The patient is a 29 y.o. male with a psychiatric history most consistent with schizophrenia. He has had several years of psychotic symptoms including disorganization in thoughts and actions (bizarre behaviors), delusions, paranoia hallucinations, and negative symptoms including poor self care and decline in functioning. No significant trauma history and no previous or current substance use concerns. Although prior notes document a bipolar disorder no credible history of mania per patient and chart review and similarly no reported episodes of depression per patient. He has never had a suicide attempt.  On this admission patient presents after recurrence of psychotic symptoms and bizarre behavior (out shoeless in the snow, running away, disorganized) after stopping his PO medications. Patient has preserved insight into mental health concerns and need for medicines and has so far been compliant with restarting invega  and depakote . As he had not been off for a significant amount of time these were restated at admission at prior home doses of 9 mg and 750 mg daily respectively. Yesterday he remained mildly tangential; gaze still intense but a bit improved. Per mom (contacted 1/24) he is almost back at baseline. Continues to express poor insight into condition and recommendation for LAI (given history of poor outpatient adherence) however not currently meeting criteria for involuntary medications. Today he presents as largely linear/organized with improved eye contact. He has continued decline LAI through the weekend and as he is likely near baseline will plan discharge tomorrow on PO medications, weather permitting transportation.   DSM-5 diagnoses: Schizophrenia   -per ICD 10: schizophrenia, paranoid type   Plan:  Legal Status: -Involuntary: IVC upheld 11/24/24  Safety -q15 minute checks   -elopement, suicide and assault precautions  -daily vitals  Psychiatric Concerns  -Continue Invega  9 mg daily for psychosis -Continue Valproic  acid depakene  solution 750 mg daily  -Trazodone  50 mg at bedtime PRN for sleep -Hydroxyzine  25 mg TID PRN for anxiety -haldol  5mg  + ativan  2 mg + benadryl  50 mg PRN for agitation  Substance use concerns  -None currently   Nicotine Replacement  N/A; does not use tobacco products   Medical concerns Vitamin D  deficiency -per labs today 19.3 -will begin weekly high-dose vitamin D  50 000 U; to be given Thursdays for 12 weeks.   -Flonase  for seasonal allergies  Additional PRNs: -Tylenol  tablets 650 mg every 6 hours as needed for pain -Maalox/Mylanta suspension 30 mL every 4 hours as needed for indigestion  -Milk of Magnesia 30 mL daily as needed for constipation  Labs 1/22 -A1c 5.6 and lipid panel WNL -Vitamin D  low (19.3)  11/21/24 -- CBC: Unremarkable -- CMP: Blood glucose 106, otherwise normal -- Ethanol: <15 -- UDS: Negative -- QT/QTc: 422/365  Psychosocial interventions  -daily medication management with psychiatry -Medication education regarding risks/benefits and alternatives -bedside psychotherapy as indicated  -Patient will be encouraged to participate and engage with group therapy  -Appreciate SW assistance in coordinating safe disposition  -Anticipated LOS 1-2 more days   Leita LOISE Arts, MD 11/28/2024, 7:42 AM

## 2024-11-29 ENCOUNTER — Encounter (HOSPITAL_COMMUNITY): Payer: Self-pay

## 2024-11-29 MED ORDER — VITAMIN D (ERGOCALCIFEROL) 1.25 MG (50000 UNIT) PO CAPS: 50000.0000 [IU] | ORAL_CAPSULE | ORAL | 0 refills | Status: AC

## 2024-11-29 MED ORDER — VALPROIC ACID 250 MG/5ML PO SOLN: 750.0000 mg | Freq: Every day | ORAL | 0 refills | Status: AC

## 2024-11-29 MED ORDER — TRAZODONE HCL 50 MG PO TABS: 50.0000 mg | ORAL_TABLET | Freq: Every day | ORAL | 0 refills | Status: AC

## 2024-11-29 MED ORDER — PALIPERIDONE ER 9 MG PO TB24: 9.0000 mg | ORAL_TABLET | Freq: Every day | ORAL | 0 refills | Status: AC

## 2024-11-29 NOTE — BHH Suicide Risk Assessment (Signed)
 [facility] Discharge Suicide Risk Assessment   Principal Problem: Schizophrenia, paranoid [facility]) Discharge Diagnoses: Principal Problem:   Schizophrenia, paranoid (HCC) Active Problems:   Seasonal allergies   Suicide Risk:  The patient presented with acute risk factors for suicide including AMS/psychosis. He did not otherwise present with depression and denied SI throughout admission. Chronic risk factors include male gender, prior psychiatric admissions, and history of poor medication adherence. However patient carries a number of protective factors including stable housing, family support, no prior suicide attemts or self-harming behavior, no significant substance use concerns. He has ACT [facility]) for additional level of care and is currently stable on medications Today presenting as linear and organized with no objective signs of psychosis or depression, denying SI, and presenting with clear future orientation. Chronic, non-modifiable risk factors will elevate risk of suicide in comparison to the general population, however current and short term risk of suicide is considered low.   Leita LOISE Arts, MD 11/29/2024, 8:39 AM

## 2024-11-29 NOTE — Progress Notes (Signed)
(  Sleep Hours) -5.5 (Any PRNs that were needed, meds refused, or side effects to meds)- hydroxyzine  (Any disturbances and when (visitation, over night)-none reported (Concerns raised by the patient)- none reported (SI/HI/AVH)-denies all

## 2024-11-29 NOTE — Progress Notes (Signed)
 Pt discharged at this time. Pt removed all belongings. Pt denies SI/HI/AVH. Pt verbalized understanding of medications and discharge instructions. Pt left facility via taxi.

## 2024-11-29 NOTE — BH IP Treatment Plan (Signed)
 Interdisciplinary Treatment and Diagnostic Plan Update  11/29/2024 Time of Session: THIS IS AN UPDATE  Leonard Davidson MRN: [id number]  Principal Diagnosis: Schizophrenia, paranoid (HCC)  Secondary Diagnoses: Principal Problem:   Schizophrenia, paranoid (HCC) Active Problems:   Seasonal allergies   Current Medications:  Current Facility-Administered Medications  Medication Dose Route Frequency Provider Last Rate Last Admin   acetaminophen  (TYLENOL ) tablet 650 mg  650 mg Oral Q6H PRN Madaram, Kondal R, MD       alum & mag hydroxide-simeth (MAALOX/MYLANTA) 200-200-20 MG/5ML suspension 30 mL  30 mL Oral Q4H PRN Madaram, Kondal R, MD       haloperidol  (HALDOL ) tablet 5 mg  5 mg Oral TID PRN Bennett, Christal H, NP       And   diphenhydrAMINE  (BENADRYL ) capsule 50 mg  50 mg Oral TID PRN Bennett, Christal H, NP       haloperidol  lactate (HALDOL ) injection 5 mg  5 mg Intramuscular TID PRN Bennett, Christal H, NP       And   diphenhydrAMINE  (BENADRYL ) injection 50 mg  50 mg Intramuscular TID PRN Bennett, Christal H, NP       And   LORazepam  (ATIVAN ) injection 2 mg  2 mg Intramuscular TID PRN Bennett, Christal H, NP       haloperidol  lactate (HALDOL ) injection 10 mg  10 mg Intramuscular TID PRN Bennett, Christal H, NP       And   diphenhydrAMINE  (BENADRYL ) injection 50 mg  50 mg Intramuscular TID PRN Bennett, Christal H, NP       And   LORazepam  (ATIVAN ) injection 2 mg  2 mg Intramuscular TID PRN Bennett, Christal H, NP       fluticasone  (FLONASE ) 50 MCG/ACT nasal spray 2 spray  2 spray Each Nare Daily PRN Bennett, Christal H, NP       hydrOXYzine  (ATARAX ) tablet 25 mg  25 mg Oral TID PRN Bennett, Christal H, NP   25 mg at 11/28/24 2148   magnesium  hydroxide (MILK OF MAGNESIA) suspension 30 mL  30 mL Oral Daily PRN Madaram, Kondal R, MD       paliperidone  (INVEGA ) 24 hr tablet 9 mg  9 mg Oral Daily Madaram, Kondal R, MD   9 mg at 11/29/24 0804   traZODone  (DESYREL ) tablet 50 mg  50 mg  Oral QHS Madaram, Kondal R, MD   50 mg at 11/28/24 2148   valproic  acid (DEPAKENE ) 250 MG/5ML solution 750 mg  750 mg Oral Daily Madaram, Kondal R, MD   750 mg at 11/29/24 0804   Vitamin D  (Ergocalciferol ) (DRISDOL ) 1.25 MG (50000 UNIT) capsule 50,000 Units  50,000 Units Oral Q7 days Towana Leita SAILOR, MD   50,000 Units at 11/25/24 1354   PTA Medications: Medications Prior to Admission  Medication Sig Dispense Refill Last Dose/Taking   amoxicillin -clavulanate (AUGMENTIN ) 875-125 MG tablet Take 1 tablet by mouth 2 (two) times daily. (Patient not taking: Reported on 11/22/2024)      divalproex  (DEPAKOTE ) 250 MG DR tablet Take by mouth 2 (two) times daily.      fluticasone  (FLONASE ) 50 MCG/ACT nasal spray Place 2 sprays into both nostrils daily.      methylPREDNISolone  (MEDROL  DOSEPAK) 4 MG TBPK tablet Take 4 mg by mouth 3 (three) times daily. (Patient not taking: Reported on 11/22/2024)      paliperidone  (INVEGA ) 9 MG 24 hr tablet Take 1 tablet (9 mg total) by mouth daily. 30 tablet 0    phenazopyridine  (PYRIDIUM )  200 MG tablet Take 1 tablet (200 mg total) by mouth 3 (three) times daily. (Patient not taking: Reported on 11/22/2024) 6 tablet 0    valproic  acid (DEPAKENE ) 250 MG/5ML solution Take 15 mLs (750 mg total) by mouth daily. (Patient not taking: Reported on 11/22/2024) 473 mL 0     Patient Stressors: Other: People doing the most    Patient Strengths: Communication skills   Treatment Modalities: Medication Management, Group therapy, Case management,  1 to 1 session with clinician, Psychoeducation, Recreational therapy.   Physician Treatment Plan for Primary Diagnosis: Schizophrenia, paranoid (HCC) Long Term Goal(s): Improvement in symptoms so as ready for discharge   Short Term Goals: Ability to identify changes in lifestyle to reduce recurrence of condition will improve Ability to verbalize feelings will improve Ability to disclose and discuss suicidal ideas Ability to demonstrate  self-control will improve Ability to identify and develop effective coping behaviors will improve Ability to maintain clinical measurements within normal limits will improve Compliance with prescribed medications will improve Ability to identify triggers associated with substance abuse/mental health issues will improve  Medication Management: Evaluate patient's response, side effects, and tolerance of medication regimen.  Therapeutic Interventions: 1 to 1 sessions, Unit Group sessions and Medication administration.  Evaluation of Outcomes: Progressing  Physician Treatment Plan for Secondary Diagnosis: Principal Problem:   Schizophrenia, paranoid (HCC) Active Problems:   Seasonal allergies  Long Term Goal(s): Improvement in symptoms so as ready for discharge   Short Term Goals: Ability to identify changes in lifestyle to reduce recurrence of condition will improve Ability to verbalize feelings will improve Ability to disclose and discuss suicidal ideas Ability to demonstrate self-control will improve Ability to identify and develop effective coping behaviors will improve Ability to maintain clinical measurements within normal limits will improve Compliance with prescribed medications will improve Ability to identify triggers associated with substance abuse/mental health issues will improve     Medication Management: Evaluate patient's response, side effects, and tolerance of medication regimen.  Therapeutic Interventions: 1 to 1 sessions, Unit Group sessions and Medication administration.  Evaluation of Outcomes: Progressing   RN Treatment Plan for Primary Diagnosis: Schizophrenia, paranoid (HCC) Long Term Goal(s): Knowledge of disease and therapeutic regimen to maintain health will improve  Short Term Goals: pt expected to discharge today  Medication Management: RN will administer medications as ordered by provider, will assess and evaluate patient's response and provide  education to patient for prescribed medication. RN will report any adverse and/or side effects to prescribing provider.  Therapeutic Interventions: 1 on 1 counseling sessions, Psychoeducation, Medication administration, Evaluate responses to treatment, Monitor vital signs and CBGs as ordered, Perform/monitor CIWA, COWS, AIMS and Fall Risk screenings as ordered, Perform wound care treatments as ordered.  Evaluation of Outcomes: Progressing   LCSW Treatment Plan for Primary Diagnosis: Schizophrenia, paranoid (HCC) Long Term Goal(s): Safe transition to appropriate next level of care at discharge, Engage patient in therapeutic group addressing interpersonal concerns.  Short Term Goals: Engage patient in aftercare planning with referrals and resources and Increase skills for wellness and recovery  Therapeutic Interventions: Assess for all discharge needs, 1 to 1 time with Social worker, Explore available resources and support systems, Assess for adequacy in community support network, Educate family and significant other(s) on suicide prevention, Complete Psychosocial Assessment, Interpersonal group therapy.  Evaluation of Outcomes: Progressing   Progress in Treatment: Attending groups: Yes. Participating in groups: Yes. Taking medication as prescribed: Yes. Toleration medication: Yes. Family/Significant other contact made: Yes, contacted: [name] (  mom) [phone] and  Gracelyn (grandma) [phone] Patient understands diagnosis: No. Discussing patient identified problems/goals with staff: Yes. Medical problems stabilized or resolved: Yes. Denies suicidal/homicidal ideation: Yes. Issues/concerns per patient self-inventory: No.   New problem(s) identified: No, Describe:  none   New Short Term/Long Term Goal(s): medication stabilization, elimination of SI thoughts, development of comprehensive mental wellness plan.      Patient Goals:  Making connections with others   Discharge Plan  or Barriers: Patient recently admitted. CSW will continue to follow and assess for appropriate referrals and possible discharge planning.      Reason for Continuation of Hospitalization: Medication stabilization Suicidal ideation Other; describe paranoia   Estimated Length of Stay: Today  Last 3 [place] Suicide Severity Risk Score: Flowsheet Row Ad[facility] (Current) [facility]21[facility]2024 in [facility] from 03/24/2024 in [facility]   C-SSRS RISK CATEGORY No Risk No Risk No Risk    Last PHQ 2/9 Scores:    05/15/2021   10:06 AM 10/17/2020    8:55 AM 06/22/2020   11:02 AM  Depression screen PHQ 2/9  Decreased Interest 0 0 0  Down, Depressed, Hopeless 0 0 0  PHQ - 2 Score 0 0 0    Scribe for Treatment Team: Jenkins LULLA Morley ISRAEL 11/29/2024 9:43 AM

## 2024-11-29 NOTE — Progress Notes (Signed)
 CONTACT NOTE:  Waynard Leavell [phone] (Crisis Line)  This clinical research associate contacted Waynard Leavell to inquire which pharmacy patient will need prescriptions sent into. Waynard Leavell states they will ask their medical team and call back momentarily.   SIGNED: Darrian Goodwill Nunez-Uva, LCSW-A

## 2024-11-29 NOTE — Discharge Summary (Signed)
 " Physician Discharge Summary Note  Patient:  Leonard Davidson is an 29 y.o., male MRN:  [id number] DOB:  17-Aug-1996 Patient phone:  [phone] (home)  Patient address:   [address] [place] [address],   Total Time spent with patient:  I personally spent 35 minutes on the unit in direct patient care. The direct patient care time included face-to-face time with the patient, reviewing the patient's chart, communicating with other professionals, and coordinating care.   Date of Admission:  11/23/2024 Date of Discharge: 11/29/24  Reason for Admission:  decompensated psychosis   Principal Problem: Schizophrenia, paranoid (HCC) Discharge Diagnoses: Principal Problem:   Schizophrenia, paranoid (HCC) Active Problems:   Seasonal allergies   Identifying Information and Past Psychiatric History:  The patient is a 29 y.o. male (domiciled with grandmother, not employed, receives Industrial/product Designer) with no significant medical history and a psychiatric history most consistent with schizophrenia. He is was admitted under IVC for psychosis: experiencing increased paranoia, and had eloped from home wearing only a T-shirt and shorts without shoes in cold.   Psychiatric history is notable for prior diagnoses of schizophrenia and bipolar disorder (although no clear evidence of frank manic episode on chart review). He has no prior suicide attempts and has denied significant affective concerns (denies depressive symptoms). He has numerous prior psychiatric hospitalizations for psychotic symptoms, most recently November 2024; prior to that in 2017. Admissions are for psychosis including disorganization in thoughts and behaviors, hallucinations, paranoia, and delusional content as well as negative symptoms including poor self care. No current or prior significant substance use concerns. He has previously been on LAI, but more recently has been on a combination of PO risperidone  and depakote   DR with good benefit when adherent. Patient has [employer] in [place].   Hospital course: The patient initially presented to [facility] ED brought in by law enforcement under IVC petitioned by his mother. At the time of presentation, the patient was reportedly experiencing increased paranoia, was nonadherent with prescribed psychiatric medications, and had eloped from home wearing only a T-shirt and shorts without shoes in cold, snowy weather.   He was admitted to [facility] under IVC with prior effective home medications restarted:  PO invega  9 mg daily and Vaproic acid depakene  solution 750 mg daily. Initially he presented with disorganized TP (tangential) and disorganized speech, intense eye contact and odd mannerisms. However, patient was immediately started back on home doses of effective medications and demonstrated daily improvements in symptoms. By 1/24 mom was contacted and she noted patient appeared to be nearing baseline. By 1/25 he was documented to be largely linear/organized I thought process with improved eye contact. He continued decline LAI through the weekend and as he was likely at or near baseline plan was for discharge the next morning on PO medications, weather permitting transportation.   Interview today: Today the patient reports he is good. He states his mom is not able to get him because of the snow and was disappointed that he has to stay in the hospital. Notified patient that we can likely get him transportation and he was happy to hear this. Otherwise, denying SI, HI and AVH. Reporting good sleep, good mood, good energy and no concerns. He is looking forward to getting some fresh air and to getting a haircut. No other concerns voiced.   Behavior on unit/ overall: Patient was admitted with psychotic symptoms in the setting of medication non-adherence. Largely pleasant throughout admission, but demonstrated clear  disorganization in thoughts, speech and behavior upon  arrival. He quickly demonstrated improvements once restarted on home regimen, which has been effective. Given poor outpatient adherence to medications LAI was encouraged throughout this admission but he consistently refused  and did not meet criteria for involuntary medications. Throughout admission was compliant with all aspects of care, including taking medications, attending groups, and responding well to staff and peers. He did not endorse depression or SI throughout entirety of admission. Ultimately once patient was at his baseline he was discharged back home to mom with PO medications and with follow-up through [facility]. Medications sent to pharmacy and all questions answered. Recommended calling 911, [phone] or presenting to the nearest ED if in crisis in the future.   Past Medical History:  Past Medical History:  Diagnosis Date   Bipolar 1 disorder (HCC)    Seasonal allergies     Past Surgical History:  Procedure Laterality Date   NO PAST SURGERIES     Family History:  Family History  Problem Relation Age of Onset   Diabetes Father     Social History:  Social History   Substance and Sexual Activity  Alcohol Use Not Currently   Comment: sometimes     Social History   Substance and Sexual Activity  Drug Use Not Currently    Social History   Socioeconomic History   Marital status: Single    Spouse name: Not on file   Number of children: Not on file   Years of education: Not on file   Highest education level: Not on file  Occupational History   Not on file  Tobacco Use   Smoking status: Never   Smokeless tobacco: Never  Vaping Use   Vaping status: Never Used  Substance and Sexual Activity   Alcohol use: Not Currently    Comment: sometimes   Drug use: Not Currently   Sexual activity: Yes  Other Topics Concern   Not on file  Social History Narrative   Not on file   Social Drivers of Health   Tobacco Use: Low Risk (11/23/2024)   Patient History     Smoking Tobacco Use: Never    Smokeless Tobacco Use: Never    Passive Exposure: Not on file  Financial Resource Strain: Not on file  Food Insecurity: No Food Insecurity (11/23/2024)   Epic    Worried About Programme Researcher, Broadcasting/film/video in the Last Year: Never true    Ran Out of Food in the Last Year: Never true  Transportation Needs: No Transportation Needs (11/23/2024)   Epic    Lack of Transportation (Medical): No    Lack of Transportation (Non-Medical): No  Physical Activity: Not on file  Stress: Not on file  Social Connections: Not on file  Depression (EYV7-0): Not on file  Alcohol Screen: Low Risk (11/23/2024)   Alcohol Screen    Last Alcohol Screening Score (AUDIT): 0  Housing: Low Risk (11/23/2024)   Epic    Unable to Pay for Housing in the Last Year: No    Number of Times Moved in the Last Year: 0    Homeless in the Last Year: No  Utilities: Not At Risk (11/23/2024)   Epic    Threatened with loss of utilities: No  Health Literacy: Not on file    Physical findings    Mental Status exam: Appearance: tall, thin, black male, appropriately groomed, seen wearing casual clothing. Seen in his room  Eye contact: good - less intense and  overall appropriate Attitude towards examiner pleasant, friendly  Psychomotor: no agitation or retardation  Speech: normal in amount and rate, normal prosody  Language: no delays  Mood: good Affect: restricted, does smile at times, more appropriate and congruent   Thought content: denying SI and HI, no overt delusions expressed  Thought Process: largely linear/organized today Perception: denying AVH, not RTIS  Insight: fair  Judgement: fair   Orientation: x3 Attention/Concentration: good - attends well to interview  Memory/Cognition: not formally assessed; grossly intact  Fund of Knowledge: Average      Musculoskeletal: Strength & Muscle Tone: within normal limits Gait & Station: normal Patient leans: N/A      Physical  Exam Constitutional:      Appearance: Normal appearance.  HENT:     Head: Normocephalic and atraumatic.  Pulmonary:     Effort: Pulmonary effort is normal.  Abdominal:     General: There is no distension.  Musculoskeletal:        General: Normal range of motion.  Neurological:     General: No focal deficit present.     Mental Status: He is alert.    Review of Systems  All other systems reviewed and are negative.  Blood pressure 109/65, pulse 70, temperature 98 F (36.7 C), temperature source Oral, resp. rate 16, height 5' 10 (1.778 m), weight 69.9 kg, SpO2 95%. Body mass index is 22.1 kg/m.   Tobacco Use History[1] Tobacco Cessation:  N/A, patient does not currently use tobacco products   Blood Alcohol level:  Lab Results  Component Value Date   [facility] <15 11/21/2024   ETH <10 09/27/2023    Metabolic Disorder Labs:  Lab Results  Component Value Date   HGBA1C 5.6 11/25/2024   MPG 114.02 11/25/2024   MPG 105 08/17/2016   No results found for: PROLACTIN Lab Results  Component Value Date   CHOL 112 11/25/2024   TRIG 19 11/25/2024   HDL 46 11/25/2024   CHOLHDL 2.5 11/25/2024   VLDL 4 11/25/2024   LDLCALC 63 11/25/2024   LDLCALC 119 (H) 07/07/2020    See Psychiatric Specialty Exam and Suicide Risk Assessment completed by Attending Physician prior to discharge.  Discharge destination:  Home  Is patient on multiple antipsychotic therapies at discharge:  No    Allergies as of 11/29/2024   No Known Allergies      Medication List     STOP taking these medications    amoxicillin -clavulanate 875-125 MG tablet Commonly known as: AUGMENTIN    divalproex  250 MG DR tablet Commonly known as: DEPAKOTE    methylPREDNISolone  4 MG Tbpk tablet Commonly known as: MEDROL  DOSEPAK   phenazopyridine  200 MG tablet Commonly known as: PYRIDIUM        TAKE these medications      Indication  fluticasone  50 MCG/ACT nasal spray Commonly known as: FLONASE  Place 2  sprays into both nostrils daily.  Indication: Allergic Rhinitis   paliperidone  9 MG 24 hr tablet Commonly known as: INVEGA  Take 1 tablet (9 mg total) by mouth daily.  Indication: Schizophrenia   traZODone  50 MG tablet Commonly known as: DESYREL  Take 1 tablet (50 mg total) by mouth at bedtime.  Indication: Trouble Sleeping   valproic  acid 250 MG/5ML solution Commonly known as: DEPAKENE  Take 15 mLs (750 mg total) by mouth daily.  Indication: Schizophrenia   Vitamin D  (Ergocalciferol ) 1.25 MG (50000 UNIT) Caps capsule Commonly known as: DRISDOL  Take 1 capsule (50,000 Units total) by mouth every 7 (seven) days for 11 doses. Take  on [facility] for 11 more weeks then repeat labs for vitamin D  level Start taking on: December 02, 2024  Indication: Vitamin D  Deficiency        Follow-up Information     [facility] Follow up on 12/07/2024.   Why: Please continue with this provider for ACTT services after discharge, on 12/07/24 at 9:00 am. Crisis phone number:  [phone] Contact information: [address] [place] [address] [phone]                 Signed: Leita LOISE Arts, MD 11/29/2024, 10:08 AM       [1]  Social History Tobacco Use  Smoking Status Never  Smokeless Tobacco Never   "

## 2024-11-29 NOTE — [facility] Group Notes (Signed)
 Adult Psychoeducational Group Note  Date:  11/29/2024 Time:  10:12 AM  Group Topic/Focus:  Goals Group:   The focus of this group is to help patients establish daily goals to achieve during treatment and discuss how the patient can incorporate goal setting into their daily lives to aide in recovery.  Participation Level:  Active  Participation Quality:  Appropriate  Affect:  Appropriate  Cognitive:  Appropriate  Insight: Good  Engagement in Group:  Engaged  Modes of Intervention:  Discussion  Additional Comments:    [name] 11/29/2024, 10:12 AM

## 2024-11-29 NOTE — Progress Notes (Addendum)
" °  [facility] Adult Case Management Discharge Plan :  Will you be returning to the same living situation after discharge:  Yes,  patient will be returning home but will be taken to grandmother's house located at [address], [place] upon discharge.  At discharge, do you have transportation home?: No. CSW arranged taxi voucher through [place] [employer] for 10:30am.  Do you have the ability to pay for your medications: Yes,  patient has active health insurance.  Release of information consent forms completed and in the chart;  Patient's signature needed at discharge.  Patient to Follow up at:  Follow-up Information     [facility] Follow up on 12/07/2024.   Why: Please continue with this provider for ACTT services after discharge, on 12/07/24 at 9:00 am. Crisis phone number:  [phone] Contact information: [address] [place] [address] [phone]                 Next level of care provider has access to [facility] Link:no  Safety Planning and Suicide Prevention discussed: Yes,  completed with [name] (mother) and Gracelyn (grandmother).      Has patient been referred to the Quitline?: Patient does not use tobacco/nicotine products  Patient has been referred for addiction treatment: No known substance use disorder.  Louetta Lame, LCSWA 11/29/2024, 9:42 AM "

## 2024-11-29 NOTE — Transportation (Signed)
 11/29/2024  Elsie PARAS Vialpando DOB: May 23, 1996 MRN: [id number]   RIDER WAIVER AND RELEASE OF LIABILITY  For the purposes of helping with transportation needs, [facility] partners with outside transportation providers (taxi companies, [employer], catering manager.) to give [facility] patients or other approved people the choice of on-demand rides Public Librarian) to our buildings for non-emergency visits.  By using [employer], I, the person signing this document, on behalf of myself and/or any legal minors (in my care using the [employer]), agree:  Science Writer given to me are supplied by independent, outside transportation providers who do not work for, or have any affiliation with, [employer]. [employer] is not a transportation company. [facility] has no control over the quality or safety of the rides I get using [employer]. [facility] has no control over whether any outside ride will happen on time or not. [facility] gives no guarantee on the reliability, quality, safety, or availability on any rides, or that no mistakes will happen. I know and accept that traveling by vehicle (car, truck, SVU, fleeta, bus, taxi, etc.) has risks of serious injuries such as disability, being paralyzed, and death. I know and agree the risk of using [employer] is mine alone, and not [employer]. [employer] are provided as is and as are available. The transportation providers are in charge for all inspections and care of the vehicles used to provide these rides. I agree not to take legal action against [employer], its agents, employees, officers, directors, representatives, insurers, attorneys, assigns, successors, subsidiaries, and affiliates at any time for any reasons related directly or indirectly to using [employer]. I also agree not to take legal action against [facility] or its affiliates for any injury, death, or damage to property caused by or related to  using [employer]. I have read this Waiver and Release of Liability, and I understand the terms used in it and their legal meaning. This Waiver is freely and voluntarily given with the understanding that my right (or any legal minors) to legal action against [facility] relating to [employer] is knowingly given up to use these services.   I attest that I read the Ride Waiver and Release of Liability to [employer], gave Mr. Voght the opportunity to ask questions and answered the questions asked (if any). I affirm that [employer] then provided consent for assistance with transportation.

## 2024-11-29 NOTE — Progress Notes (Signed)
 CONTACT NOTE:   Leonard Davidson [phone] (Crisis Line)      This clinical research associate spoke with Garrie Clause from [place] who confirmed patient's pharmacy as [facility] in [place]. Provider notified.  SIGNED: Council Munguia Nunez-Uva, LCSW-A
